# Patient Record
Sex: Male | Born: 1950 | Race: Black or African American | Hispanic: No | Marital: Single | Smoking: Former smoker
Health system: Southern US, Community
[De-identification: ages and names within clinical notes are randomized; demographics above are authoritative.]

## PROBLEM LIST (undated history)

## (undated) DIAGNOSIS — E119 Type 2 diabetes mellitus without complications: Secondary | ICD-10-CM

## (undated) DIAGNOSIS — F0390 Unspecified dementia without behavioral disturbance: Secondary | ICD-10-CM

## (undated) DIAGNOSIS — I169 Hypertensive crisis, unspecified: Secondary | ICD-10-CM

## (undated) DIAGNOSIS — E785 Hyperlipidemia, unspecified: Secondary | ICD-10-CM

## (undated) DIAGNOSIS — I10 Essential (primary) hypertension: Secondary | ICD-10-CM

## (undated) DIAGNOSIS — L97509 Non-pressure chronic ulcer of other part of unspecified foot with unspecified severity: Secondary | ICD-10-CM

## (undated) DIAGNOSIS — I639 Cerebral infarction, unspecified: Secondary | ICD-10-CM

## (undated) DIAGNOSIS — R1310 Dysphagia, unspecified: Secondary | ICD-10-CM

## (undated) DIAGNOSIS — D649 Anemia, unspecified: Secondary | ICD-10-CM

## (undated) HISTORY — PX: TONSILLECTOMY: SUR1361

## (undated) HISTORY — PX: BACK SURGERY: SHX140

## (undated) HISTORY — DX: Anemia, unspecified: D64.9

## (undated) HISTORY — DX: Unspecified dementia, unspecified severity, without behavioral disturbance, psychotic disturbance, mood disturbance, and anxiety: F03.90

## (undated) HISTORY — DX: Hyperlipidemia, unspecified: E78.5

## (undated) HISTORY — DX: Dysphagia, unspecified: R13.10

---

## 2002-10-10 ENCOUNTER — Emergency Department (HOSPITAL_COMMUNITY): Admission: EM | Admit: 2002-10-10 | Discharge: 2002-10-10 | Payer: Self-pay

## 2002-10-12 ENCOUNTER — Emergency Department (HOSPITAL_COMMUNITY): Admission: EM | Admit: 2002-10-12 | Discharge: 2002-10-12 | Payer: Self-pay

## 2013-02-15 ENCOUNTER — Inpatient Hospital Stay (HOSPITAL_COMMUNITY)
Admission: EM | Admit: 2013-02-15 | Discharge: 2013-02-16 | DRG: 078 | Disposition: A | Discharge status: Home / Self Care | Payer: Medicaid Other | Attending: Family Medicine | Admitting: Family Medicine

## 2013-02-15 ENCOUNTER — Emergency Department (HOSPITAL_COMMUNITY): Payer: Medicaid Other

## 2013-02-15 ENCOUNTER — Encounter (HOSPITAL_COMMUNITY): Payer: Self-pay | Admitting: Emergency Medicine

## 2013-02-15 DIAGNOSIS — I674 Hypertensive encephalopathy: Secondary | ICD-10-CM

## 2013-02-15 DIAGNOSIS — Z6825 Body mass index (BMI) 25.0-25.9, adult: Secondary | ICD-10-CM

## 2013-02-15 DIAGNOSIS — E46 Unspecified protein-calorie malnutrition: Secondary | ICD-10-CM | POA: Diagnosis present

## 2013-02-15 DIAGNOSIS — R7309 Other abnormal glucose: Secondary | ICD-10-CM

## 2013-02-15 DIAGNOSIS — D649 Anemia, unspecified: Secondary | ICD-10-CM | POA: Diagnosis present

## 2013-02-15 DIAGNOSIS — I169 Hypertensive crisis, unspecified: Secondary | ICD-10-CM | POA: Diagnosis present

## 2013-02-15 DIAGNOSIS — Z794 Long term (current) use of insulin: Secondary | ICD-10-CM

## 2013-02-15 DIAGNOSIS — I10 Essential (primary) hypertension: Secondary | ICD-10-CM

## 2013-02-15 DIAGNOSIS — F17200 Nicotine dependence, unspecified, uncomplicated: Secondary | ICD-10-CM | POA: Diagnosis present

## 2013-02-15 DIAGNOSIS — I160 Hypertensive urgency: Secondary | ICD-10-CM

## 2013-02-15 DIAGNOSIS — I959 Hypotension, unspecified: Secondary | ICD-10-CM | POA: Diagnosis present

## 2013-02-15 DIAGNOSIS — L84 Corns and callosities: Secondary | ICD-10-CM

## 2013-02-15 DIAGNOSIS — E119 Type 2 diabetes mellitus without complications: Secondary | ICD-10-CM | POA: Diagnosis present

## 2013-02-15 DIAGNOSIS — R739 Hyperglycemia, unspecified: Secondary | ICD-10-CM

## 2013-02-15 HISTORY — DX: Essential (primary) hypertension: I10

## 2013-02-15 HISTORY — DX: Type 2 diabetes mellitus without complications: E11.9

## 2013-02-15 HISTORY — DX: Hypertensive crisis, unspecified: I16.9

## 2013-02-15 LAB — GLUCOSE, CAPILLARY
GLUCOSE-CAPILLARY: 221 mg/dL — AB (ref 70–99)
Glucose-Capillary: >600 mg/dL (ref 70–99)
Glucose-Capillary: 486 mg/dL — ABNORMAL HIGH (ref 70–99)
Glucose-Capillary: 401 mg/dL — ABNORMAL HIGH (ref 70–99)
Glucose-Capillary: 429 mg/dL — ABNORMAL HIGH (ref 70–99)
Glucose-Capillary: 334 mg/dL — ABNORMAL HIGH (ref 70–99)
Glucose-Capillary: 288 mg/dL — ABNORMAL HIGH (ref 70–99)

## 2013-02-15 LAB — LIPID PANEL
Cholesterol: 189 mg/dL (ref 0–200)
HDL: 49 mg/dL (ref >39)
LDL Cholesterol: 114 mg/dL — ABNORMAL HIGH (ref 0–99)
Total CHOL/HDL Ratio: 3.9 ratio
Triglycerides: 129 mg/dL (ref <150)
VLDL: 26 mg/dL (ref 0–40)

## 2013-02-15 LAB — RETICULOCYTES
RBC.: 3.61 MIL/uL — ABNORMAL LOW (ref 4.22–5.81)
Retic Count, Absolute: 65 10*3/uL (ref 19.0–186.0)
Retic Ct Pct: 1.8 % (ref 0.4–3.1)

## 2013-02-15 LAB — URINALYSIS, ROUTINE W REFLEX MICROSCOPIC
Bilirubin Urine: NEGATIVE
Glucose, UA: >1000 mg/dL — AB
Hgb urine dipstick: NEGATIVE
Ketones, ur: NEGATIVE mg/dL
Leukocytes, UA: NEGATIVE
Nitrite: NEGATIVE
Protein, ur: NEGATIVE mg/dL
Specific Gravity, Urine: 1.028 (ref 1.005–1.030)
Urobilinogen, UA: 0.2 mg/dL (ref 0.0–1.0)
pH: 7 (ref 5.0–8.0)

## 2013-02-15 LAB — CBC
HCT: 35.2 % — ABNORMAL LOW (ref 39.0–52.0)
Hemoglobin: 12 g/dL — ABNORMAL LOW (ref 13.0–17.0)
MCH: 31 pg (ref 26.0–34.0)
MCHC: 34.1 g/dL (ref 30.0–36.0)
MCV: 91 fL (ref 78.0–100.0)
Platelets: 206 K/uL (ref 150–400)
RBC: 3.87 MIL/uL — ABNORMAL LOW (ref 4.22–5.81)
RDW: 12.5 % (ref 11.5–15.5)
WBC: 6.5 K/uL (ref 4.0–10.5)

## 2013-02-15 LAB — BASIC METABOLIC PANEL
BUN: 21 mg/dL (ref 6–23)
CO2: 26 mEq/L (ref 19–32)
Calcium: 9.1 mg/dL (ref 8.4–10.5)
Chloride: 94 mEq/L — ABNORMAL LOW (ref 96–112)
Creatinine, Ser: 1.17 mg/dL (ref 0.50–1.35)
GFR calc Af Amer: 76 mL/min — ABNORMAL LOW (ref >90)
GFR calc non Af Amer: 66 mL/min — ABNORMAL LOW (ref >90)
Glucose, Bld: 669 mg/dL (ref 70–99)
Potassium: 4.6 mEq/L (ref 3.7–5.3)

## 2013-02-15 LAB — RAPID URINE DRUG SCREEN, HOSP PERFORMED
Amphetamines: NOT DETECTED
Barbiturates: NOT DETECTED
Benzodiazepines: NOT DETECTED
COCAINE: NOT DETECTED
Opiates: NOT DETECTED
Tetrahydrocannabinol: NOT DETECTED

## 2013-02-15 LAB — BASIC METABOLIC PANEL WITH GFR: Sodium: 133 meq/L — ABNORMAL LOW (ref 137–147)

## 2013-02-15 LAB — URINE MICROSCOPIC-ADD ON

## 2013-02-15 LAB — TROPONIN I
Troponin I: <0.3 ng/mL (ref <0.30)
Troponin I: <0.3 ng/mL (ref <0.30)

## 2013-02-15 LAB — HEMOGLOBIN A1C
Hgb A1c MFr Bld: 10.6 % — ABNORMAL HIGH (ref <5.7)
Mean Plasma Glucose: 258 mg/dL — ABNORMAL HIGH (ref <117)

## 2013-02-15 LAB — MRSA PCR SCREENING: MRSA by PCR: NEGATIVE

## 2013-02-15 MED ORDER — INSULIN GLARGINE 100 UNIT/ML ~~LOC~~ SOLN
15.0000 [IU] | Freq: Every day | SUBCUTANEOUS | Status: DC
  Administered 2013-02-15: 15 [IU] via SUBCUTANEOUS
  Filled 2013-02-15 (×2): qty 0.15

## 2013-02-15 MED ORDER — SODIUM CHLORIDE 0.9 % IV BOLUS (SEPSIS)
1000.0000 mL | Freq: Once | INTRAVENOUS | Status: AC
  Administered 2013-02-15: 1000 mL via INTRAVENOUS

## 2013-02-15 MED ORDER — LABETALOL HCL 5 MG/ML IV SOLN
10.0000 mg | Freq: Once | INTRAVENOUS | Status: AC
  Administered 2013-02-15: 10 mg via INTRAVENOUS
  Filled 2013-02-15: qty 4

## 2013-02-15 MED ORDER — AMLODIPINE BESYLATE 10 MG PO TABS
10.0000 mg | ORAL_TABLET | Freq: Every day | ORAL | Status: DC
  Administered 2013-02-15: 10 mg via ORAL
  Filled 2013-02-15 (×2): qty 1

## 2013-02-15 MED ORDER — VITAMIN B-1 100 MG PO TABS
100.0000 mg | ORAL_TABLET | Freq: Every day | ORAL | Status: DC
  Administered 2013-02-16: 100 mg via ORAL
  Filled 2013-02-15 (×2): qty 1

## 2013-02-15 MED ORDER — ADULT MULTIVITAMIN W/MINERALS CH
1.0000 | ORAL_TABLET | Freq: Every day | ORAL | Status: DC
  Administered 2013-02-15 – 2013-02-16 (×2): 1 via ORAL
  Filled 2013-02-15 (×2): qty 1

## 2013-02-15 MED ORDER — LISINOPRIL 5 MG PO TABS
5.0000 mg | ORAL_TABLET | Freq: Every day | ORAL | Status: DC
  Administered 2013-02-15 – 2013-02-16 (×2): 5 mg via ORAL
  Filled 2013-02-15 (×2): qty 1

## 2013-02-15 MED ORDER — HEPARIN SODIUM (PORCINE) 5000 UNIT/ML IJ SOLN
5000.0000 [IU] | Freq: Three times a day (TID) | INTRAMUSCULAR | Status: DC
  Administered 2013-02-15 – 2013-02-16 (×2): 5000 [IU] via SUBCUTANEOUS
  Filled 2013-02-15 (×5): qty 1

## 2013-02-15 MED ORDER — FOLIC ACID 1 MG PO TABS
1.0000 mg | ORAL_TABLET | Freq: Every day | ORAL | Status: DC
  Administered 2013-02-15 – 2013-02-16 (×2): 1 mg via ORAL
  Filled 2013-02-15 (×2): qty 1

## 2013-02-15 MED ORDER — LORAZEPAM 0.5 MG PO TABS
1.0000 mg | ORAL_TABLET | Freq: Four times a day (QID) | ORAL | Status: DC | PRN
Start: 2013-02-15 — End: 2013-02-16

## 2013-02-15 MED ORDER — INSULIN ASPART 100 UNIT/ML ~~LOC~~ SOLN: 0.0000 [IU] | Freq: Every day | SUBCUTANEOUS | Status: DC

## 2013-02-15 MED ORDER — LORAZEPAM 2 MG/ML IJ SOLN: 1.0000 mg | Freq: Four times a day (QID) | INTRAMUSCULAR | Status: DC | PRN

## 2013-02-15 MED ORDER — INSULIN ASPART 100 UNIT/ML ~~LOC~~ SOLN
0.0000 [IU] | Freq: Three times a day (TID) | SUBCUTANEOUS | Status: DC
Start: 2013-02-15 — End: 2013-02-16
  Administered 2013-02-15 – 2013-02-16 (×2): 5 [IU] via SUBCUTANEOUS

## 2013-02-15 MED ORDER — THIAMINE HCL 100 MG/ML IJ SOLN
100.0000 mg | Freq: Every day | INTRAMUSCULAR | Status: DC
  Administered 2013-02-15: 100 mg via INTRAVENOUS
  Filled 2013-02-15: qty 2
  Filled 2013-02-15: qty 1

## 2013-02-15 MED ORDER — SODIUM CHLORIDE 0.9 % IJ SOLN
3.0000 mL | Freq: Two times a day (BID) | INTRAMUSCULAR | Status: DC
  Administered 2013-02-16: 3 mL via INTRAVENOUS

## 2013-02-15 MED ORDER — PNEUMOCOCCAL VAC POLYVALENT 25 MCG/0.5ML IJ INJ
0.5000 mL | INJECTION | INTRAMUSCULAR | Status: AC
  Administered 2013-02-16: 0.5 mL via INTRAMUSCULAR
  Filled 2013-02-15: qty 0.5

## 2013-02-15 MED ORDER — INSULIN ASPART 100 UNIT/ML ~~LOC~~ SOLN
10.0000 [IU] | Freq: Once | SUBCUTANEOUS | Status: AC
  Administered 2013-02-15: 10 [IU] via SUBCUTANEOUS
  Filled 2013-02-15: qty 1

## 2013-02-15 MED ORDER — THIAMINE HCL 100 MG/ML IJ SOLN
INTRAVENOUS | Status: DC
  Administered 2013-02-15: 16:00:00 via INTRAVENOUS
  Filled 2013-02-15 (×4): qty 1000

## 2013-02-15 MED ORDER — LABETALOL HCL 5 MG/ML IV SOLN
2.0000 mg/min | INTRAVENOUS | Status: DC
  Filled 2013-02-15: qty 100

## 2013-02-15 NOTE — ED Notes (Signed)
Pt states he has felt "woozy" since yesterday and hes worried his blood sugar may be high. He has no glucometer at home. He denies pain and is A&Ox4. He came alone. He denies LOC. HE appears to have a R sided facial droop. He has no arm drift and no trouble with his speech

## 2013-02-15 NOTE — H&P (Signed)
FMTS Attending Admission Note: Manuel Neal MD [phone] pager office [phone] I  have seen and examined this patient, reviewed their chart. I have discussed this patient with the resident. I agree with the resident's findings, assessment and care plan. 

## 2013-02-15 NOTE — ED Notes (Signed)
Returned from ct scan and placed back on monitor 

## 2013-02-15 NOTE — ED Provider Notes (Addendum)
Medical screening examination/treatment/procedure(s) were conducted as a shared visit with non-physician practitioner(s) and myself.  I personally evaluated the patient during the encounter.  EKG Interpretation    Date/Time:  Saturday February 15 2013 09:58:12 EST Ventricular Rate:  99 PR Interval:  175 QRS Duration: 92 QT Interval:  352 QTC Calculation: 452 R Axis:   77 Text Interpretation:  Sinus rhythm Biatrial enlargement Left ventricular hypertrophy Repolarization abnormality secondary to ventricular hypertrophy Baseline wander in lead(s) V3 Abnormal ekg No significant change since last tracing Confirmed by [facility]  MD, MICHEAL ([id number]) on 02/15/2013 11:16:26 AM            Pt with slurred speech, feels dizzy, has not been taking his blood sugars at home due to no equipment, but reports taking his BP and insulin medications.  Pt's glucose >600 initially, no vomiting.  Initial BP is >190, 200 systolic.  No HA, CP, SOB, but midlly encephalopathic, may need IV antihypertensives for HTN emergency and admission.  Doubt DKA.    Pt's glucose has been improved with IVF's and Becker insulin.  However BP has remained quite elevated.  Will start gtt labetalol and admit for HTN emergency.   CRITICAL CARE Performed by: Lear NgGHIM,Lotta Frankenfield Y. Total critical care time: 30 min Critical care time was exclusive of separately billable procedures and treating other patients. Critical care was necessary to treat or prevent imminent or life-threatening deterioration. Critical care was time spent personally by me on the following activities: development of treatment plan with patient and/or surrogate as well as nursing, discussions with consultants, evaluation of patient's response to treatment, examination of patient, obtaining history from patient or surrogate, ordering and performing treatments and interventions, ordering and review of laboratory studies, ordering and review of radiographic studies, pulse oximetry and  re-evaluation of patient's condition.   Gavin PoundMichael Y. Oletta LamasGhim, MD 02/15/13 1425  Gavin PoundMichael Y. Sreekar Broyhill, MD 02/15/13 1622

## 2013-02-15 NOTE — H&P (Signed)
Family Medicine Teaching [facility] Ad[facility] History and Physical Service Pager: [phone]  Patient name: Manuel Higgins Medical record number: [id number] Date of birth: 09/01/1950 Age: 63 y.o. Gender: male  Primary Care Provider: Bernerd LimboJohn [name] ([facility] in [place]) Consultants: none Code Status: Full  Chief Complaint: Confusion, malaise,   Assessment and Plan: Manuel PanderDarrell Dowen is a 63 y.o. year old male presenting with 24 hours of malaise, fatigue, elevated CBG and HTN . PMH is significant for lack of medical care, hypertension, diabetes.  # HTN Urgency/Emergency: ?altered MS vs borderline baseline functioning.  Will treat as hypertensive encephalopathy - Decreased blood pressure of 25% of in first 24 hours. - Avoid nitroglycerin as increased ICP associated with this - Not requiring IV labetalol drip at this time but low threshold to consider transition to drip if unable to control or waxing/waning mental status or focal neurologic deficit - CT of head negative for acute bleed - Start by mouth amlodipine and lisinopril. - Cycle cardiac enzymes  # Encephalopathy: - Unclear etiology, treating for hypertensive emergency however consider other causes including alcohol withdrawal as previously alcohol use was noted on his chart but he denies at this time. -  Banana bag x1 now, IV thiamine 100 mg x1 now - CIWA  protocol, low threshold for Ativan  # Hyperglycemia: Non-acidotic, responding to subcutaneous insulin - Reportedly previously on insulin but has not filled any prescriptions since February 2014 but could be obtaining over-the-counter. - Start low-dose basal plus moderate sliding scale  # Anemia, normocytic. - Could possibly be a mixed picture of iron deficiency and B12, check iron panel and B12 level. - Continue to monitor  # Risks stratification: - TSH, A1c, lipid profile - No known prior coronary artery disease. - Has not had a colonoscopy  FEN/GI: Modify diet,  no PPI Prophylaxis: Heparin subcutaneous  Disposition: Inpatient admission to step down unit for blood pressure control, glucose control.  Attending Dr. Jennette KettleNeal.  Social work consult for concerns for homelessness and [facility]  History of Present Illness: Manuel PanderDarrell Tall is a 63 y.o. year old male presenting with 24 hours of malaise, fatigue, noted to have elevated blood pressures at the group home he lives in.  Due to his worsening confusion, lightheadedness and overall fatigue presented for further evaluation.  He reports being on insulin and her blood pressure medications but is unsure as to what they are.  [place] [facility] [place] [facility] [place] [facility].  Previously homeless.  In emergency department found to be in progressively hypotensive with profound hyperglycemia with a waxing and waning mental status.  FMTS call permission of management of both.  Review Of Systems: Per HPI with the following additions: Patient denies chest pain, palpitations, orthopnea, PND.  Reports significant polydipsia and polyuria over the past 3 weeks.  Denies hematochezia, on stools.   Reports nausea no vomiting.  Reports good appetite.  Denies any weight loss.    HISTORY: Past Medical History  Diagnosis Date  . Hypertension   . Diabetes mellitus without complication    History reviewed. No pertinent past surgical history. Social Hx: History   Social History  . Marital Status: Divorced    Spouse Name: N/A    Number of Children: N/A  . Years of Education: N/A   Social History Main Topics  . Smoking status: Current Every Day Smoker -- 0.33 packs/day  . Smokeless tobacco: None  . Alcohol Use: No     Comment:  Denies Currently but previously marked as yes  . Drug Use: No     Comment: denies hx of IVDrug use  . Sexual Activity: None   Other Topics Concern  . None   Social History Narrative   Lives in [place] in [place] for past 6 weeks.  Previously  homeless.   Unemployed.   History reviewed. No pertinent family history. No Known Allergies Home Medications (NOT Verifiable - see above): Medication Sig  insulin aspart protamine- aspart (NOVOLOG MIX 70/30) (70-30) 100 UNIT/ML injection Inject 20 Units into the skin 2 (two) times daily.  PRESCRIPTION MEDICATION Take 1 tablet by mouth daily. Two different blood pressure medications. Last dose yesterday.    OBJECTIVE: Temp:  [97.9 F (36.6 C)-98.8 F (37.1 C)] 98.8 F (37.1 C) (01/31 1606) Pulse Rate:  [90-107] 96 (01/31 1515) Resp:  [15-22] 16 (01/31 1606) BP: (171-233)/(72-119) 183/82 mmHg (01/31 1606) SpO2:  [97 %-100 %] 99 % (01/31 1606) Weight:  [172 lb (78.019 kg)] 172 lb (78.019 kg) (01/31 0925) Body mass index is 25.39 kg/(m^2). BASELINE WEIGHT:  Unknown   Filed Weights   02/15/13 0925  Weight: 172 lb (78.019 kg)      Physical Exam:  GENERAL: Adult African American  male. In no discomfort; no respiratory distress  PSYCH: alert and appropriate, good insight   HNEENT: Mildly dry mucous membranes, no JVD/HJR  CARDIO: RRR, S1/S2 heard, no murmur  LUNGS: CTA B, no wheezes, no crackles  ABDOMEN: +BS, soft, non-tender, no rigidity, no guarding, no masses/hepatosplenomegaly  EXTREM:  Warm, well perfused.  Moves all 4 extremities spontaneously; no lateralization.  No noted foot lesions.  Distal pulses 3/4.  no pretibial edema.  GU:   SKIN:    EKG:  1/31 - NSR 99 - LVH criteria, no ischemia.   LABS:  Basic Labs Extended:   Recent Labs Lab 02/15/13 1010  WBC 6.5  HGB 12.0*  HCT 35.2*  PLT 206    Recent Labs Lab 02/15/13 1010  NA 133*  K 4.6  CL 94*  CO2 26  BUN 21  CREATININE 1.17  GLUCOSE 669*  CALCIUM 9.1   No results found for this basename: ALBUMIN, PROTEIN, ALT, AST, ALKPHOS, BILITOT, MG, LIPASE,  in the last 168 hours     Urinalysis: Further Urine Studies:  >1000 glucose, otherwise unremarkable  Recent Labs  02/15/13 1002  LABOPIA NONE  DETECTED  COCAINSCRNUR NONE DETECTED  LABBENZ NONE DETECTED  AMPHETMU NONE DETECTED  THCU NONE DETECTED  LABBARB NONE DETECTED     IMAGING: 1/31 - Head CT - neg for acute bleed   Andrena Mews, DO [facility] Resident - PGY- 3  02/15/2013 4:16 PM

## 2013-02-15 NOTE — ED Notes (Signed)
Attempted to call report to [facility], rn will call me back

## 2013-02-15 NOTE — ED Provider Notes (Signed)
CSN: [id number]     Arrival date & time 02/15/13  0857 History   First MD Initiated Contact with Patient 02/15/13 [phone]     Chief Complaint  Patient presents with  . Dizziness   (Consider location/radiation/quality/duration/timing/severity/associated sxs/prior Treatment) HPI Comments: Patient with h/o DM diagnosed 10-15 years ago, on Metformin and insulin, patient states he last took yesterday but not today -- presents stating that he has felt 'woozy' for two days and came in because he thought his blood sugar was high. He has not checked at home. No fever, URI sx, CP, SOB, cough, N/V/D, abd pain, urinary sx. No difficulty walking. No lightheadedness or syncope. Patient denies signs of stroke including:slurred speech, aphasia, weakness/numbness in extremities, imbalance/trouble walking. The onset of this condition was acute. The course is constant. Aggravating factors: none. Alleviating factors: none.      The history is provided by the patient.    Past Medical History  Diagnosis Date  . Hypertension   . Diabetes mellitus without complication    History reviewed. No pertinent past surgical history. History reviewed. No pertinent family history. History  Substance Use Topics  . Smoking status: Never Smoker   . Smokeless tobacco: Not on file  . Alcohol Use: Yes    Review of Systems  Constitutional: Negative for fever.  HENT: Negative for rhinorrhea and sore throat.   Eyes: Negative for redness.  Respiratory: Negative for cough and shortness of breath.   Cardiovascular: Negative for chest pain.  Gastrointestinal: Negative for nausea, vomiting, abdominal pain and diarrhea.  Endocrine: Positive for polydipsia and polyuria.  Genitourinary: Negative for dysuria.  Musculoskeletal: Negative for myalgias.  Skin: Negative for rash.  Neurological: Positive for dizziness. Negative for headaches.    Allergies  Review of patient's allergies indicates no known allergies.  Home  Medications   Current Outpatient Rx  Name  Route  Sig  Dispense  Refill  . insulin aspart protamine- aspart (NOVOLOG MIX 70/30) (70-30) 100 UNIT/ML injection   Subcutaneous   Inject 20 Units into the skin 2 (two) times daily.         Marland Kitchen. PRESCRIPTION MEDICATION   Oral   Take 1 tablet by mouth daily. Two different blood pressure medications. Last dose yesterday.          BP 193/94  Pulse 100  Temp(Src) 97.9 F (36.6 C) (Oral)  Resp 22  Ht 5\' 9"  (1.753 m)  Wt 172 lb (78.019 kg)  BMI 25.39 kg/m2  Physical Exam  Nursing note and vitals reviewed. Constitutional: He is oriented to person, place, and time. He appears well-developed and well-nourished.  HENT:  Head: Normocephalic and atraumatic.  Right Ear: Tympanic membrane, external ear and ear canal normal.  Left Ear: Tympanic membrane, external ear and ear canal normal.  Nose: Nose normal.  Mouth/Throat: Uvula is midline, oropharynx is clear and moist and mucous membranes are normal.  Eyes: Conjunctivae, EOM and lids are normal. Pupils are equal, round, and reactive to light. Right eye exhibits no discharge. Left eye exhibits no discharge.  Neck: Normal range of motion. Neck supple.  Cardiovascular: Normal rate, regular rhythm and normal heart sounds.   Pulmonary/Chest: Effort normal and breath sounds normal.  Abdominal: Soft. There is no tenderness.  Musculoskeletal: Normal range of motion.       Cervical back: He exhibits normal range of motion, no tenderness and no bony tenderness.  Neurological: He is alert and oriented to person, place, and time. He has normal strength and normal  reflexes. No cranial nerve deficit or sensory deficit. He exhibits normal muscle tone. He displays a negative Romberg sign. Coordination and gait normal. GCS eye subscore is 4. GCS verbal subscore is 5. GCS motor subscore is 6.  Mild R-sided facial weakness, mild slurred speech. Neg Romberg. No weakness in extremities.   Skin: Skin is warm and  dry.  Psychiatric: He has a normal mood and affect.    ED Course  Procedures (including critical care time) Labs Review Labs Reviewed  GLUCOSE, CAPILLARY - Abnormal; Notable for the following:    Glucose-Capillary >600 (*)    All other components within normal limits  CBC - Abnormal; Notable for the following:    RBC 3.87 (*)    Hemoglobin 12.0 (*)    HCT 35.2 (*)    All other components within normal limits  BASIC METABOLIC PANEL - Abnormal; Notable for the following:    Sodium 133 (*)    Chloride 94 (*)    Glucose, Bld 669 (*)    GFR calc non Af Amer 66 (*)    GFR calc Af Amer 76 (*)    All other components within normal limits  URINALYSIS, ROUTINE W REFLEX MICROSCOPIC - Abnormal; Notable for the following:    Glucose, UA >1000 (*)    All other components within normal limits  GLUCOSE, CAPILLARY - Abnormal; Notable for the following:    Glucose-Capillary 486 (*)    All other components within normal limits  GLUCOSE, CAPILLARY - Abnormal; Notable for the following:    Glucose-Capillary 429 (*)    All other components within normal limits  GLUCOSE, CAPILLARY - Abnormal; Notable for the following:    Glucose-Capillary 401 (*)    All other components within normal limits  URINE MICROSCOPIC-ADD ON  URINE RAPID DRUG SCREEN (HOSP PERFORMED)   Imaging Review Ct Head Wo Contrast  02/15/2013   CLINICAL DATA:  Lightheadedness and weakness.  EXAM: CT HEAD WITHOUT CONTRAST  TECHNIQUE: Contiguous axial images were obtained from the base of the skull through the vertex without intravenous contrast.  COMPARISON:  None.  FINDINGS: There is no evidence of intracranial hemorrhage, brain edema, or other signs of acute infarction. There is no evidence of intracranial mass lesion or mass effect. No abnormal extraaxial fluid collections are identified.  Mild cerebral atrophy and moderate chronic small vessel disease is noted. No evidence of hydrocephalus. No skull abnormality identified.   IMPRESSION: No acute intracranial findings.  Cerebral atrophy and chronic small vessel disease.   Electronically Signed   By: Myles Rosenthal M.D.   On: 02/15/2013 12:00    EKG Interpretation    Date/Time:  Saturday February 15 2013 09:58:12 EST Ventricular Rate:  99 PR Interval:  175 QRS Duration: 92 QT Interval:  352 QTC Calculation: 452 R Axis:   77 Text Interpretation:  Sinus rhythm Biatrial enlargement Left ventricular hypertrophy Repolarization abnormality secondary to ventricular hypertrophy Baseline wander in lead(s) V3 Abnormal ekg No significant change since last tracing Confirmed by [facility]  MD, MICHEAL ([id number]) on 02/15/2013 11:16:26 AM           9:39 AM Patient seen and examined. Work-up initiated. Medications ordered.   Vital signs reviewed and are as follows: Filed Vitals:   02/15/13 0925  BP: 193/94  Pulse: 100  Temp: 97.9 F (36.6 C)  Resp: 22   11:02 AM Reviewed findings with Dr. Oletta Lamas who will see.   Labetalol IV x 1 given with improvement in BP. However this was  short-lived and BP elevated back into 200's. Labetalol drip ordered.   1:41 PM Spoke with [facility] who will take this unassigned patient.   MDM   1. Hypertensive urgency   2. Hyperglycemia without ketosis    Admit.     Renne Crigler, PA-C 02/15/13 1342

## 2013-02-15 NOTE — ED Notes (Signed)
Patient transported to CT 

## 2013-02-16 ENCOUNTER — Encounter (HOSPITAL_COMMUNITY): Payer: Self-pay | Admitting: Sports Medicine

## 2013-02-16 DIAGNOSIS — E119 Type 2 diabetes mellitus without complications: Secondary | ICD-10-CM | POA: Diagnosis present

## 2013-02-16 DIAGNOSIS — I674 Hypertensive encephalopathy: Secondary | ICD-10-CM | POA: Diagnosis present

## 2013-02-16 DIAGNOSIS — L84 Corns and callosities: Secondary | ICD-10-CM

## 2013-02-16 LAB — COMPREHENSIVE METABOLIC PANEL
ALT: 12 U/L (ref 0–53)
AST: 14 U/L (ref 0–37)
Albumin: 3.1 g/dL — ABNORMAL LOW (ref 3.5–5.2)
Alkaline Phosphatase: 140 U/L — ABNORMAL HIGH (ref 39–117)
CO2: 24 mEq/L (ref 19–32)
Calcium: 8.5 mg/dL (ref 8.4–10.5)
GFR calc Af Amer: >90 mL/min (ref >90)
GFR calc non Af Amer: 79 mL/min — ABNORMAL LOW (ref >90)
Potassium: 3.6 mEq/L — ABNORMAL LOW (ref 3.7–5.3)
Sodium: 141 mEq/L (ref 137–147)
Total Protein: 6.5 g/dL (ref 6.0–8.3)

## 2013-02-16 LAB — COMPREHENSIVE METABOLIC PANEL WITH GFR
BUN: 15 mg/dL (ref 6–23)
Chloride: 104 meq/L (ref 96–112)
Creatinine, Ser: 1 mg/dL (ref 0.50–1.35)
Glucose, Bld: 219 mg/dL — ABNORMAL HIGH (ref 70–99)
Total Bilirubin: 0.2 mg/dL — ABNORMAL LOW (ref 0.3–1.2)

## 2013-02-16 LAB — FERRITIN: Ferritin: 337 ng/mL — ABNORMAL HIGH (ref 22–322)

## 2013-02-16 LAB — VITAMIN B12: Vitamin B-12: 566 pg/mL (ref 211–911)

## 2013-02-16 LAB — TSH: TSH: 1.342 u[IU]/mL (ref 0.350–4.500)

## 2013-02-16 LAB — CBC
HCT: 31.8 % — ABNORMAL LOW (ref 39.0–52.0)
Hemoglobin: 10.8 g/dL — ABNORMAL LOW (ref 13.0–17.0)
MCH: 30.8 pg (ref 26.0–34.0)
MCHC: 34 g/dL (ref 30.0–36.0)
MCV: 90.6 fL (ref 78.0–100.0)
Platelets: 201 10*3/uL (ref 150–400)
RBC: 3.51 MIL/uL — ABNORMAL LOW (ref 4.22–5.81)
RDW: 12.3 % (ref 11.5–15.5)
WBC: 7.1 K/uL (ref 4.0–10.5)

## 2013-02-16 LAB — TROPONIN I: Troponin I: <0.3 ng/mL (ref <0.30)

## 2013-02-16 LAB — GLUCOSE, CAPILLARY
GLUCOSE-CAPILLARY: 221 mg/dL — AB (ref 70–99)
Glucose-Capillary: 187 mg/dL — ABNORMAL HIGH (ref 70–99)

## 2013-02-16 LAB — IRON AND TIBC
Iron: 67 ug/dL (ref 42–135)
Saturation Ratios: 30 % (ref 20–55)
TIBC: 227 ug/dL (ref 215–435)
UIBC: 160 ug/dL (ref 125–400)

## 2013-02-16 LAB — FOLATE: Folate: >20 ng/mL

## 2013-02-16 MED ORDER — NAPHAZOLINE HCL 0.1 % OP SOLN
1.0000 [drp] | Freq: Four times a day (QID) | OPHTHALMIC | Status: DC | PRN
  Administered 2013-02-16: 1 [drp] via OPHTHALMIC
  Filled 2013-02-16: qty 15

## 2013-02-16 MED ORDER — INSULIN ASPART PROT & ASPART (70-30 MIX) 100 UNIT/ML ~~LOC~~ SUSP
20.0000 [IU] | Freq: Two times a day (BID) | SUBCUTANEOUS | Status: DC
  Administered 2013-02-16: 20 [IU] via SUBCUTANEOUS
  Filled 2013-02-16: qty 10

## 2013-02-16 MED ORDER — AMLODIPINE BESYLATE 5 MG PO TABS
5.0000 mg | ORAL_TABLET | Freq: Every day | ORAL | Status: DC
  Administered 2013-02-16: 5 mg via ORAL
  Filled 2013-02-16: qty 1

## 2013-02-16 MED ORDER — AMLODIPINE BESYLATE 10 MG PO TABS: 10.0000 mg | ORAL_TABLET | Freq: Every day | ORAL | Status: DC

## 2013-02-16 MED ORDER — NAPHAZOLINE HCL 0.1 % OP SOLN: 1.0000 [drp] | Freq: Four times a day (QID) | OPHTHALMIC | Status: DC | PRN

## 2013-02-16 MED ORDER — LISINOPRIL 10 MG PO TABS: 10.0000 mg | ORAL_TABLET | Freq: Every day | ORAL | Status: DC

## 2013-02-16 NOTE — Discharge Summary (Signed)
Family Medicine Teaching [facility] Discharge Summary  Patient name: Manuel Higgins Medical record number: [id number] Date of birth: 28-Apr-1950 Age: 63 y.o. Gender: male Date of Admission: 02/15/2013  Date of Discharge: 02/16/13 Admitting Physician: Nestor Ramp, MD  Primary Care Provider: Bernerd Limbo Consultants: None  Indication for Hospitalization: Hypertensive urgency and hyperglycemia  Discharge Diagnoses/Problem List:  Principal Problem:   Encephalopathy, hypertensive Active Problems:   Hypertensive crisis   Diabetes   Callus of foot  Disposition: To Home  Discharge Condition: Improved  Brief Hospital Course:  Manuel Higgins is a 63 y.o. year old male presenting with 24 hours of malaise, fatigue, elevated CBG and HTN . PMH is significant for lack of medical care, hypertension, diabetes. He was noted to have elevated blood pressures at the group home he lives in.  Due to his worsening confusion, lightheadedness and overall fatigue presented for further evaluation.  He reports being on insulin and two blood pressure medications but is unsure as to what they are.     He reports previously running out of his medicines but that they have recently been refilled.He has recently moved into a transitional home in [facility].  Previously homeless.  In emergency department found to be in progressively hypertensive with profound hyperglycemia with a waxing and waning mental status.    # HTN Urgency/Emergency: ?altered MS vs borderline baseline functioning.  Treated as as hypertensive encephalopathy - Plan was to decrease blood pressure by 25% the first 24 hours.  He responded remarkably well to amlodipine 10 mg and lisinopril 10 mg.  He received 2 doses of IV labetalol.  - Unclear if  medication reconciliation performed late afternoon on the day of admission is accurate reflects the medications initiated during hospitalization he will be discharged on the below.  He reports me he feels his  medications at [facility] but after discussing with them he has not filled any medications since February 2014 with them.  - BP 233/119 at highest.  Prior to d/c on dual therapy as above: 137/69 - CT of head negative for acute bleed - Start by mouth amlodipine and lisinopril. - EKG with Early Repol on day of discharge - Pt chest pain free throughout hospitalization  # Encephalopathy: - Unclear etiology, treating for hypertensive emergency however consider other causes including alcohol withdrawal as previously alcohol use was noted on his chart but he denies at this time. -  Banana bag x1 now, IV thiamine 100 mg x1 now - Did not require Ativan   # Hyperglycemia & Diabetes: Non-acidotic, responding to subcutaneous insulin - Pt reports receiving medications directly from his physician. - Sugars responded remarkally well to small amounts of insulin.  Restarted on home 70/30 20Units bid - s/p fluid resuscitation with marked improvement  # Anemia, normocytic. - Could possibly be a mixed picture of iron deficiency and B12, check iron panel and B12 level. - Continue to monitor  # Protein Calorie Malnutrition: Albumin 3.1   # Risks stratification: - TSH, A1c, lipid profile - No known prior coronary artery disease. - Has not had a colonoscopy  # Foot Callus: Markedly hyperkeratotic lesion on the right plantar aspect of the big toe.   - Followup with podiatry  - Encourage daily foot soaks and checks   Issues for Follow Up:  - Medication compliance & accessibility - reports having his home blood pressure medications that are reportedly the same as below.  New prescriptions were [facility] to [facility] (on [facility] list) - Food  security - Daily Diabetic foot exams/care - Podiatry followup - Age appropriate screening  Significant Procedures:  None  Pending Results: None  Discharge Medications:   Medication List         amLODipine 10 MG tablet  Commonly known as:  NORVASC  Take 1 tablet (10 mg  total) by mouth daily.     insulin aspart protamine- aspart (70-30) 100 UNIT/ML injection  Commonly known as:  NOVOLOG MIX 70/30  Inject 20 Units into the skin 2 (two) times daily.     lisinopril 10 MG tablet  Commonly known as:  PRINIVIL,ZESTRIL  Take 1 tablet (10 mg total) by mouth daily.     naphazoline 0.1 % ophthalmic solution  Commonly known as:  NAPHCON  Place 1 drop into both eyes 4 (four) times daily as needed for irritation.       Future Appointment information:  Follow up Issues: Follow-up Information   Schedule an appointment as soon as possible for a visit with Manuel LimboMITCHELL, Higgins. (For this week)    Specialty:  Family Medicine   Contact information:   [address], [address] [facility] [place] [address] [phone]       Schedule an appointment as soon as possible for a visit with Your podiatrist. (For this week)       OBJECTIVE Findings on Day of Discharge: Temp:  [97.8 F (36.6 C)-98.8 F (37.1 C)] 98.1 F (36.7 C) (02/01 0749) Pulse Rate:  [76-96] 76 (02/01 0749) Resp:  [14-20] 15 (02/01 0830) BP: (137-212)/(57-96) 137/69 mmHg (02/01 0830) SpO2:  [97 %-100 %] 99 % (02/01 0500) Weight:  [172 lb 6.4 oz (78.2 kg)] 172 lb 6.4 oz (78.2 kg) (01/31 1823) Body mass index is 25.45 kg/(m^2). BASELINE WEIGHT:  Unknown   Filed Weights   02/15/13 0925 02/15/13 1823  Weight: 172 lb (78.019 kg) 172 lb 6.4 oz (78.2 kg)      Physical Exam:  GENERAL: Adult African American  male. In no discomfort; no respiratory distress  PSYCH: alert and appropriate, good insight, oriented x4   HNEENT: Mildly dry mucous membranes, no JVD/HJR  CARDIO: RRR, S1/S2 heard, no murmur  LUNGS: CTA B, no wheezes, no crackles  ABDOMEN: +BS, soft, non-tender, no rigidity, no guarding, no masses/hepatosplenomegaly  EXTREM:  Warm, well perfused.  Moves all 4 extremities spontaneously; no lateralization.  Significant right foot callus with hyperkeratotic  properties.  No erythema.  Dystrophic nails.  Distal pulses 2/4.  no pretibial edema.  GU:   SKIN:    EKG:  1/31 - NSR 99 - LVH criteria, no ischemia.  2/1 - NSR 85 - LVH criteria, Early Repol - Discussed EKG with Dr. Gala RomneyBensimhon. (Pt chest pain free)  LABS:  Basic Labs Extended:   Recent Labs Lab 02/15/13 1010 02/16/13 0255  WBC 6.5 7.1  HGB 12.0* 10.8*  HCT 35.2* 31.8*  PLT 206 201     Recent Labs Lab 02/15/13 1010 02/16/13 0255  NA 133* 141  K 4.6 3.6*  CL 94* 104  CO2 26 24  BUN 21 15  CREATININE 1.17 1.00  GLUCOSE 669* 219*  CALCIUM 9.1 8.5     Recent Labs Lab 02/16/13 0255  ALBUMIN 3.1*  ALT 12  AST 14  ALKPHOS 140*  BILITOT 0.2*    Recent Labs  02/15/13 1534 02/15/13 1550  HGBA1C 10.6*  --   TRIG  --  129  CHOL  --  189  HDL  --  49  LDLCALC  --  114*     Recent Labs Lab 02/15/13 1521 02/15/13 2154 02/16/13 0255  TROPONINI <0.30 <0.30 <0.30     02/15/2013 21:54  Iron 67  UIBC 160  TIBC 227  Saturation Ratios 30     Urinalysis: Further Urine Studies:  >1000 glucose, otherwise unremarkable  Recent Labs  02/15/13 1002  LABOPIA NONE DETECTED  COCAINSCRNUR NONE DETECTED  LABBENZ NONE DETECTED  AMPHETMU NONE DETECTED  THCU NONE DETECTED  LABBARB NONE DETECTED     IMAGING: 1/31 - Head CT - neg for acute bleed   Andrena Mews, DO [facility] Resident - PGY- 3  02/16/2013 11:56 AM

## 2013-02-16 NOTE — Discharge Summary (Signed)
Family Medicine Teaching Service  Discharge Note : Attending Denny LevySara Shalie Schremp MD Pager [phone] Office [phone] I have seen and examined this patient, reviewed their chart and discussed discharge planning wit the resident at the time of discharge. I agree with the discharge plan as above. I certainly think there some component of noncompliance with his blood pressure medicines as cause for his elevated pressures.

## 2013-02-17 LAB — GLUCOSE, CAPILLARY: Glucose-Capillary: 194 mg/dL — ABNORMAL HIGH (ref 70–99)

## 2013-03-16 DIAGNOSIS — I639 Cerebral infarction, unspecified: Secondary | ICD-10-CM

## 2013-03-16 HISTORY — DX: Cerebral infarction, unspecified: I63.9

## 2013-03-26 ENCOUNTER — Emergency Department (INDEPENDENT_AMBULATORY_CARE_PROVIDER_SITE_OTHER)
Admission: EM | Admit: 2013-03-26 | Discharge: 2013-03-26 | Disposition: A | Discharge status: Home / Self Care | Payer: Medicaid Other | Source: Home / Self Care | Attending: Family Medicine | Admitting: Family Medicine

## 2013-03-26 ENCOUNTER — Encounter (HOSPITAL_COMMUNITY): Payer: Self-pay | Admitting: Emergency Medicine

## 2013-03-26 DIAGNOSIS — B353 Tinea pedis: Secondary | ICD-10-CM

## 2013-03-26 MED ORDER — TERBINAFINE HCL 250 MG PO TABS: 250.0000 mg | ORAL_TABLET | Freq: Every day | ORAL | Status: DC

## 2013-03-26 NOTE — ED Provider Notes (Signed)
Manuel Higgins is a 63 y.o. male who presents to Urgent Care today for left great toe rash. Patient notes a black plaque on his left great toe. This is been present for 4 months. He denies any injury pain fevers or chills. It is mildly itchy. He feels well otherwise.   Past Medical History  Diagnosis Date  . Hypertension   . Diabetes mellitus without complication    History  Substance Use Topics  . Smoking status: Current Every Day Smoker -- 0.33 packs/day  . Smokeless tobacco: Not on file  . Alcohol Use: No     Comment: Denies Currently but previously marked as yes   ROS as above Medications: No current facility-administered medications for this encounter.   Current Outpatient Prescriptions  Medication Sig Dispense Refill  . amLODipine (NORVASC) 10 MG tablet Take 1 tablet (10 mg total) by mouth daily.  30 tablet  0  . insulin aspart protamine- aspart (NOVOLOG MIX 70/30) (70-30) 100 UNIT/ML injection Inject 20 Units into the skin 2 (two) times daily.      Marland Kitchen. lisinopril (PRINIVIL,ZESTRIL) 10 MG tablet Take 1 tablet (10 mg total) by mouth daily.  30 tablet  0  . naphazoline (NAPHCON) 0.1 % ophthalmic solution Place 1 drop into both eyes 4 (four) times daily as needed for irritation.  15 mL  0  . terbinafine (LAMISIL) 250 MG tablet Take 1 tablet (250 mg total) by mouth daily.  14 tablet  0    Exam:  BP 171/78  Pulse 88  Temp(Src) 98.7 F (37.1 C) (Oral)  Resp 16  SpO2 99%  Gen: Well NAD LEFT FOOT: Hypertrophied nails. Black velvety plaque involving the left great toe and interdigital web space consistent with tinea nigra or tinea pedis. P   Assessment and Plan: 63 y.o. male with tinea pedis. Patient has trouble getting down to his feet. Will use oral terbinafine. Followup with primary care provider ASAP.  Discussed warning signs or symptoms. Please see discharge instructions. Patient expresses understanding.    Rodolph BongEvan S Taneka Espiritu, MD 03/26/13 1250

## 2013-03-26 NOTE — Discharge Instructions (Signed)
Thank you for coming in today. Soak your feet in warm water and clean them daily. Take terbinafine daily for 2 weeks Followup with your doctor soon.    Athlete's Foot  Athlete's foot is a skin infection caused by a fungus. Athlete's foot is often seen between or under the toes. It can also be seen on the bottom of the foot. Athlete's foot can spread to other people by sharing towels or shower stalls. HOME CARE  Only take medicines as told by your doctor. Do not use steroid creams.  Wash your feet daily. Dry your feet well, especially between the toes.  Change your socks every day. Wear cotton or wool socks.  Change your socks 2 to 3 times a day in hot weather.  Wear sandals or canvas tennis shoes with good airflow.  If you have blisters, soak your feet in a solution as told by your doctor. Do this for 20 to 30 minutes, 2 times a day. Dry your feet well after you soak them.  Do not share towels.  Wear sandals when you use shared locker rooms or showers. GET HELP RIGHT AWAY IF:   You have a fever.  Your foot is puffy (swollen), sore, warm, or red.  You are not getting better after 7 days of treatment.  You still have athlete's foot after 30 days.  You have problems caused by your medicine. MAKE SURE YOU:   Understand these instructions.  Will watch your condition.  Will get help right away if you are not doing well or get worse. Document Released: 06/21/2007 Document Revised: 03/27/2011 Document Reviewed: 10/21/2010 [facility] Patient Information 2014 [employer] [place].

## 2013-03-26 NOTE — ED Notes (Signed)
4 month duration of problem w foot

## 2013-04-04 ENCOUNTER — Inpatient Hospital Stay (HOSPITAL_COMMUNITY): Payer: Medicaid Other

## 2013-04-04 ENCOUNTER — Inpatient Hospital Stay (HOSPITAL_COMMUNITY)
Admission: EM | Admit: 2013-04-04 | Discharge: 2013-04-09 | DRG: 040 | Disposition: A | Discharge status: Home Health | Payer: Medicaid Other | Attending: Internal Medicine | Admitting: Internal Medicine

## 2013-04-04 ENCOUNTER — Encounter (HOSPITAL_COMMUNITY): Payer: Self-pay | Admitting: Emergency Medicine

## 2013-04-04 ENCOUNTER — Emergency Department (HOSPITAL_COMMUNITY): Payer: Medicaid Other

## 2013-04-04 DIAGNOSIS — G9340 Encephalopathy, unspecified: Secondary | ICD-10-CM | POA: Diagnosis present

## 2013-04-04 DIAGNOSIS — E785 Hyperlipidemia, unspecified: Secondary | ICD-10-CM | POA: Diagnosis present

## 2013-04-04 DIAGNOSIS — L97509 Non-pressure chronic ulcer of other part of unspecified foot with unspecified severity: Secondary | ICD-10-CM | POA: Diagnosis present

## 2013-04-04 DIAGNOSIS — I6350 Cerebral infarction due to unspecified occlusion or stenosis of unspecified cerebral artery: Secondary | ICD-10-CM | POA: Diagnosis not present

## 2013-04-04 DIAGNOSIS — E1169 Type 2 diabetes mellitus with other specified complication: Secondary | ICD-10-CM

## 2013-04-04 DIAGNOSIS — L02619 Cutaneous abscess of unspecified foot: Secondary | ICD-10-CM | POA: Diagnosis present

## 2013-04-04 DIAGNOSIS — I6340 Cerebral infarction due to embolism of unspecified cerebral artery: Secondary | ICD-10-CM | POA: Diagnosis present

## 2013-04-04 DIAGNOSIS — I4891 Unspecified atrial fibrillation: Secondary | ICD-10-CM | POA: Diagnosis present

## 2013-04-04 DIAGNOSIS — I639 Cerebral infarction, unspecified: Secondary | ICD-10-CM | POA: Diagnosis present

## 2013-04-04 DIAGNOSIS — Z794 Long term (current) use of insulin: Secondary | ICD-10-CM | POA: Diagnosis not present

## 2013-04-04 DIAGNOSIS — E119 Type 2 diabetes mellitus without complications: Secondary | ICD-10-CM | POA: Diagnosis not present

## 2013-04-04 DIAGNOSIS — R471 Dysarthria and anarthria: Secondary | ICD-10-CM | POA: Diagnosis present

## 2013-04-04 DIAGNOSIS — L84 Corns and callosities: Secondary | ICD-10-CM

## 2013-04-04 DIAGNOSIS — F17200 Nicotine dependence, unspecified, uncomplicated: Secondary | ICD-10-CM | POA: Diagnosis present

## 2013-04-04 DIAGNOSIS — E1165 Type 2 diabetes mellitus with hyperglycemia: Secondary | ICD-10-CM | POA: Diagnosis present

## 2013-04-04 DIAGNOSIS — L03039 Cellulitis of unspecified toe: Secondary | ICD-10-CM | POA: Diagnosis present

## 2013-04-04 DIAGNOSIS — R1310 Dysphagia, unspecified: Secondary | ICD-10-CM | POA: Diagnosis present

## 2013-04-04 DIAGNOSIS — Z8673 Personal history of transient ischemic attack (TIA), and cerebral infarction without residual deficits: Secondary | ICD-10-CM

## 2013-04-04 DIAGNOSIS — I169 Hypertensive crisis, unspecified: Secondary | ICD-10-CM

## 2013-04-04 DIAGNOSIS — I10 Essential (primary) hypertension: Secondary | ICD-10-CM | POA: Diagnosis present

## 2013-04-04 DIAGNOSIS — I517 Cardiomegaly: Secondary | ICD-10-CM

## 2013-04-04 DIAGNOSIS — N179 Acute kidney failure, unspecified: Secondary | ICD-10-CM | POA: Diagnosis present

## 2013-04-04 DIAGNOSIS — G459 Transient cerebral ischemic attack, unspecified: Secondary | ICD-10-CM | POA: Diagnosis present

## 2013-04-04 DIAGNOSIS — IMO0002 Reserved for concepts with insufficient information to code with codable children: Secondary | ICD-10-CM | POA: Diagnosis present

## 2013-04-04 DIAGNOSIS — I059 Rheumatic mitral valve disease, unspecified: Secondary | ICD-10-CM | POA: Diagnosis not present

## 2013-04-04 DIAGNOSIS — I674 Hypertensive encephalopathy: Secondary | ICD-10-CM

## 2013-04-04 HISTORY — DX: Cerebral infarction, unspecified: I63.9

## 2013-04-04 LAB — GLUCOSE, CAPILLARY
GLUCOSE-CAPILLARY: 228 mg/dL — AB (ref 70–99)
GLUCOSE-CAPILLARY: 242 mg/dL — AB (ref 70–99)
Glucose-Capillary: 202 mg/dL — ABNORMAL HIGH (ref 70–99)
Glucose-Capillary: 276 mg/dL — ABNORMAL HIGH (ref 70–99)

## 2013-04-04 LAB — CBC
HEMATOCRIT: 37.5 % — AB (ref 39.0–52.0)
Hemoglobin: 13.3 g/dL (ref 13.0–17.0)
MCH: 31.7 pg (ref 26.0–34.0)
MCHC: 35.5 g/dL (ref 30.0–36.0)
MCV: 89.3 fL (ref 78.0–100.0)
Platelets: 192 10*3/uL (ref 150–400)
RBC: 4.2 MIL/uL — AB (ref 4.22–5.81)
RDW: 11.8 % (ref 11.5–15.5)
WBC: 7.9 10*3/uL (ref 4.0–10.5)

## 2013-04-04 LAB — I-STAT TROPONIN, ED: Troponin i, poc: 0.01 ng/mL (ref 0.00–0.08)

## 2013-04-04 LAB — RAPID URINE DRUG SCREEN, HOSP PERFORMED
Amphetamines: NOT DETECTED
BENZODIAZEPINES: NOT DETECTED
Barbiturates: NOT DETECTED
Cocaine: NOT DETECTED
OPIATES: NOT DETECTED
Tetrahydrocannabinol: NOT DETECTED

## 2013-04-04 LAB — COMPREHENSIVE METABOLIC PANEL
ALT: 12 U/L (ref 0–53)
AST: 15 U/L (ref 0–37)
Albumin: 3.5 g/dL (ref 3.5–5.2)
Alkaline Phosphatase: 152 U/L — ABNORMAL HIGH (ref 39–117)
BUN: 21 mg/dL (ref 6–23)
CALCIUM: 9.7 mg/dL (ref 8.4–10.5)
CO2: 25 meq/L (ref 19–32)
Chloride: 100 mEq/L (ref 96–112)
Creatinine, Ser: 1.24 mg/dL (ref 0.50–1.35)
GFR, EST AFRICAN AMERICAN: 71 mL/min — AB (ref >90)
GFR, EST NON AFRICAN AMERICAN: 61 mL/min — AB (ref >90)
GLUCOSE: 174 mg/dL — AB (ref 70–99)
Potassium: 4.1 mEq/L (ref 3.7–5.3)
Sodium: 141 mEq/L (ref 137–147)
Total Bilirubin: 0.2 mg/dL — ABNORMAL LOW (ref 0.3–1.2)
Total Protein: 7.3 g/dL (ref 6.0–8.3)

## 2013-04-04 LAB — URINALYSIS, ROUTINE W REFLEX MICROSCOPIC
BILIRUBIN URINE: NEGATIVE
HGB URINE DIPSTICK: NEGATIVE
KETONES UR: NEGATIVE mg/dL
Leukocytes, UA: NEGATIVE
Nitrite: NEGATIVE
Protein, ur: 30 mg/dL — AB
Specific Gravity, Urine: 1.03 (ref 1.005–1.030)
Urobilinogen, UA: 0.2 mg/dL (ref 0.0–1.0)
pH: 6 (ref 5.0–8.0)

## 2013-04-04 LAB — DIFFERENTIAL
Basophils Absolute: 0 10*3/uL (ref 0.0–0.1)
Basophils Relative: 0 % (ref 0–1)
EOS PCT: 2 % (ref 0–5)
Eosinophils Absolute: 0.1 10*3/uL (ref 0.0–0.7)
LYMPHS ABS: 1.5 10*3/uL (ref 0.7–4.0)
LYMPHS PCT: 19 % (ref 12–46)
MONO ABS: 0.8 10*3/uL (ref 0.1–1.0)
Monocytes Relative: 10 % (ref 3–12)
Neutro Abs: 5.5 10*3/uL (ref 1.7–7.7)
Neutrophils Relative %: 69 % (ref 43–77)

## 2013-04-04 LAB — URINE MICROSCOPIC-ADD ON

## 2013-04-04 LAB — I-STAT CHEM 8, ED
BUN: 28 mg/dL — ABNORMAL HIGH (ref 6–23)
CREATININE: 1.4 mg/dL — AB (ref 0.50–1.35)
Calcium, Ion: 1.21 mmol/L (ref 1.13–1.30)
Chloride: 102 mEq/L (ref 96–112)
Glucose, Bld: 169 mg/dL — ABNORMAL HIGH (ref 70–99)
HCT: 41 % (ref 39.0–52.0)
HEMOGLOBIN: 13.9 g/dL (ref 13.0–17.0)
Potassium: 4.3 mEq/L (ref 3.7–5.3)
SODIUM: 140 meq/L (ref 137–147)
TCO2: 29 mmol/L (ref 0–100)

## 2013-04-04 LAB — HEMOGLOBIN A1C
Hgb A1c MFr Bld: 11.6 % — ABNORMAL HIGH (ref <5.7)
Mean Plasma Glucose: 286 mg/dL — ABNORMAL HIGH (ref <117)

## 2013-04-04 LAB — ETHANOL: Alcohol, Ethyl (B): <11 mg/dL (ref 0–11)

## 2013-04-04 LAB — PROTIME-INR
INR: 1.02 (ref 0.00–1.49)
Prothrombin Time: 13.2 seconds (ref 11.6–15.2)

## 2013-04-04 LAB — APTT: aPTT: 26 seconds (ref 24–37)

## 2013-04-04 MED ORDER — ASPIRIN 300 MG RE SUPP
300.0000 mg | Freq: Once | RECTAL | Status: AC
  Administered 2013-04-04: 300 mg via RECTAL
  Filled 2013-04-04: qty 1

## 2013-04-04 MED ORDER — SODIUM CHLORIDE 0.9 % IV SOLN
INTRAVENOUS | Status: DC
  Administered 2013-04-04 – 2013-04-05 (×3): via INTRAVENOUS

## 2013-04-04 MED ORDER — SODIUM CHLORIDE 0.9 % IV SOLN: INTRAVENOUS | Status: DC

## 2013-04-04 MED ORDER — ENOXAPARIN SODIUM 40 MG/0.4ML ~~LOC~~ SOLN
40.0000 mg | SUBCUTANEOUS | Status: DC
  Filled 2013-04-04: qty 0.4

## 2013-04-04 MED ORDER — INSULIN ASPART 100 UNIT/ML ~~LOC~~ SOLN
0.0000 [IU] | SUBCUTANEOUS | Status: DC
  Administered 2013-04-04 (×2): 3 [IU] via SUBCUTANEOUS
  Administered 2013-04-04: 5 [IU] via SUBCUTANEOUS
  Administered 2013-04-05: 3 [IU] via SUBCUTANEOUS
  Administered 2013-04-05: 7 [IU] via SUBCUTANEOUS
  Administered 2013-04-05: 3 [IU] via SUBCUTANEOUS
  Administered 2013-04-05 – 2013-04-06 (×3): 2 [IU] via SUBCUTANEOUS
  Administered 2013-04-06: 3 [IU] via SUBCUTANEOUS
  Administered 2013-04-06: 5 [IU] via SUBCUTANEOUS
  Administered 2013-04-06: 1 [IU] via SUBCUTANEOUS
  Administered 2013-04-06: 3 [IU] via SUBCUTANEOUS
  Administered 2013-04-07: 7 [IU] via SUBCUTANEOUS
  Administered 2013-04-07 (×2): 1 [IU] via SUBCUTANEOUS
  Administered 2013-04-07 (×2): 2 [IU] via SUBCUTANEOUS
  Administered 2013-04-08: 5 [IU] via SUBCUTANEOUS

## 2013-04-04 MED ORDER — ACETAMINOPHEN 650 MG RE SUPP: 650.0000 mg | RECTAL | Status: DC | PRN

## 2013-04-04 MED ORDER — STUDY - INVESTIGATIONAL DRUG SIMPLE RECORD
600.0000 mg | Status: AC
  Administered 2013-04-04: 600 mg via ORAL
  Filled 2013-04-04: qty 600

## 2013-04-04 MED ORDER — ASPIRIN 300 MG RE SUPP
300.0000 mg | Freq: Every day | RECTAL | Status: DC
  Filled 2013-04-04 (×5): qty 1

## 2013-04-04 MED ORDER — ASPIRIN 81 MG PO CHEW
324.0000 mg | CHEWABLE_TABLET | Freq: Once | ORAL | Status: DC
  Filled 2013-04-04: qty 4

## 2013-04-04 MED ORDER — ASPIRIN 325 MG PO TABS
325.0000 mg | ORAL_TABLET | Freq: Every day | ORAL | Status: DC
  Administered 2013-04-05 – 2013-04-09 (×5): 325 mg via ORAL
  Filled 2013-04-04 (×5): qty 1

## 2013-04-04 MED ORDER — STUDY - INVESTIGATIONAL DRUG SIMPLE RECORD
75.0000 mg | Freq: Every day | Status: DC
  Administered 2013-04-05 – 2013-04-09 (×5): 75 mg via ORAL
  Filled 2013-04-04 (×2): qty 75

## 2013-04-04 MED ORDER — ACETAMINOPHEN 325 MG PO TABS: 650.0000 mg | ORAL_TABLET | ORAL | Status: DC | PRN

## 2013-04-04 NOTE — Care Management Note (Signed)
    Page 1 of 2   04/07/2013     4:08:55 PM   CARE MANAGEMENT NOTE 04/07/2013  Patient:  Manuel Higgins,Manuel Higgins   Account Number:  [id number]  Date Initiated:  04/04/2013  Documentation initiated by:  GRAVES-BIGELOW,Yazmin Locher  Subjective/Objective Assessment:   Pt admitted for Garbled speech with facial drooping this morning. MRI + for Multiple acute and subacute small vessel infarcts in the bilateral cerebral hemispheres.  [facility]     Action/Plan:   CM will continue to monitor for disposition needs.   Anticipated DC Date:  04/07/2013   Anticipated DC Plan:  HOME W HOME HEALTH SERVICES      DC Planning Services  CM consult      [facility]  [facility]   Choice offered to / List presented to:  C-2 HC POA / Guardian        HH arranged  HH-1 RN  HH-10 DISEASE MANAGEMENT  HH-2 PT  HH-3 OT      HH agency  [facility]   Status of service:  Completed, signed off Medicare Important Message given?   (If response is "NO", the following Medicare IM given date fields will be blank) Date Medicare IM given:   Date Additional Medicare IM given:    Discharge Disposition:  HOME W HOME HEALTH SERVICES  Per UR Regulation:  Reviewed for med. necessity/level of care/duration of stay  If discussed at Long Length of Stay Meetings, dates discussed:    Comments:  [phone] Darryl Rivers [facility]  [facility]. SOC to begin within 24-48 hrs post d/c. Tomi BambergerBrenda Graves- Bigelow, RN,BSN [phone]

## 2013-04-04 NOTE — ED Notes (Signed)
[facility] [address] [place]  [phone]  Patient states that he takes a BP pill but [facility] has no record of him having a prescription.  Uses insulin

## 2013-04-04 NOTE — ED Notes (Signed)
Speech garbled but patient able to answers question appropriately.

## 2013-04-04 NOTE — Evaluation (Signed)
Speech Language Pathology Evaluation Patient Details Name: Manuel PanderDarrell Brents MRN: [id number] DOB: 07/10/1950 Today's Date: 04/04/2013 Time: 0950-1000 SLP Time Calculation (min): 10 min  Problem List:  Patient Active Problem List   Diagnosis Date Noted  . CVA (cerebral infarction) 04/04/2013  . Acute encephalopathy 04/04/2013  . Acute renal failure 04/04/2013  . Dysphagia 04/04/2013  . Diabetes 02/16/2013  . Encephalopathy, hypertensive 02/16/2013  . Callus of foot 02/16/2013  . Hypertensive crisis 02/15/2013   Past Medical History:  Past Medical History  Diagnosis Date  . Hypertension   . Diabetes mellitus without complication    Past Surgical History:  Past Surgical History  Procedure Laterality Date  . Back surgery    . Tonsillectomy     HPI:  63 year old male with with history of hypertension, insulin-dependent diabetes mellitus, who [facility] was seen loss normal at 11:30 p.m. at night before going to bed. [facility] noted right-sided facial drooping and garbled speech this morning, at 5:15 AM when he woke up.  CT No acute abnormality.    Assessment / Plan / Recommendation Clinical Impression  Pt. demonstrates mild dysarthria marked by consonant distortion.  Intelligibility will be challenged in noisy environment.  Verbal responses regarding cognitive abilities appear functional, however suspect difficutly during performance of ADL's.  SLP will follow to facilitate speech intelligibilty and diagnostically further assess cogntive abilities.    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)   Cognition  Overall Cognitive Status: Within Functional Limits for tasks assessed Arousal/Alertness: Awake/alert Orientation Level: Oriented X4 Attention: Sustained Sustained Attention: Appears intact Memory:  (TBA) Awareness: Appears intact Problem Solving:  (verbal appears intact, will assess further) Safety/Judgment:  (will assess further)    Comprehension  Auditory Comprehension Overall Auditory Comprehension: Appears within functional limits for tasks assessed Visual Recognition/Discrimination Discrimination: Not tested Reading Comprehension Reading Status: Not tested    Expression Expression Primary Mode of Expression: Verbal Verbal Expression Overall Verbal Expression: Appears within functional limits for tasks assessed Written Expression Dominant Hand: Right Written Expression:  (will assess)   Oral / Motor Oral Motor/Sensory Function Overall Oral Motor/Sensory Function: Impaired Labial ROM: Reduced right Labial Symmetry: Abnormal symmetry right Labial Strength: Reduced Lingual ROM: Within Functional Limits Lingual Symmetry: Within Functional Limits Lingual Strength: Within Functional Limits Facial ROM: Within Functional Limits Facial Symmetry: Within Functional Limits Facial Strength: Within Functional Limits Velum: Within Functional Limits Mandible: Within Functional Limits Motor Speech Overall Motor Speech: Impaired Respiration: Within functional limits Phonation: Normal Resonance: Within functional limits Articulation: Within functional limitis Intelligibility: Intelligibility reduced Word: 75-100% accurate Phrase: 75-100% accurate Sentence: 75-100% accurate Conversation: 75-100% accurate Motor Planning: Witnin functional limits   GO     Breck CoonsLisa Willis [employer] CorporationLitaker M.Ed [employer]-SLP Pager [phone]  04/04/2013

## 2013-04-04 NOTE — ED Notes (Signed)
[facility]  Staff reported to EMS that the patient was last seen normal approximately 2330.  Left facial droop and garbled speech.

## 2013-04-04 NOTE — Progress Notes (Signed)
UR Completed Daphnee Preiss Graves-Bigelow, RN,BSN [phone]  

## 2013-04-04 NOTE — Progress Notes (Signed)
*  PRELIMINARY RESULTS* Vascular Ultrasound Carotid Duplex (Doppler) has been completed.  Findings suggest 1-39% internal carotid artery stenosis bilaterally. Vertebral arteries are patent with antegrade flow.  04/04/2013 3:55 PM Gertie FeyMichelle Christophere Hillhouse, RVT, RDCS, RDMS

## 2013-04-04 NOTE — H&P (Signed)
History and Physical       Hospital Admission Note Date: 04/04/2013  Patient name: Manuel Higgins Medical record number: [id number] Date of birth: Jul 31, 1950 Age: 63 y.o. Gender: male  PCP: Bernerd Limbo    Chief Complaint:  Garbled speech with facial drooping this morning  HPI: Patient is a 63 year old male with with history of hypertension, insulin-dependent diabetes mellitus, who is currently at resident of [facility] (? Group home) was seen loss normal at 11:30 p.m. at night before going to bed. This morning the [facility] staff noted right-sided facial drooping and garbled speech this morning, at  5:15 AM when he woke up. The patient still has garbled speech and is not able to provide a clear history himself. He denies any prior history of stroke. Neurology was consulted in the ER, patient was not consider TPA candidate due to late presentation. Patient was recommended admission for stroke workup.  Review of Systems:  Patient is a limited historian (? Baseline mental status) and unable to provide complete history himself Gen.: Patient denied any fevers or chills or loss of appetite HEENT: Denies any current sore throat or recent illness CVS/cardiovascular: Denies any chest pain or shortness of breath Respiratory: Denies any chest pain, shortness of breath or wheezing Gastroenterology: Denied any nausea, vomiting, abdominal pain Musculoskeletal: Denies any back pain, states that he ambulates by himself  Past Medical History: Past Medical History  Diagnosis Date  . Hypertension   . Diabetes mellitus without complication    History reviewed. No pertinent past surgical history.  Medications: Prior to Admission medications   Medication Sig Start Date End Date Taking? Authorizing Provider  insulin aspart protamine- aspart (NOVOLOG MIX 70/30) (70-30) 100 UNIT/ML injection Inject 20 Units into the skin 2 (two) times daily.    Yes Historical Provider, MD  amLODipine (NORVASC) 10 MG tablet Take 1 tablet (10 mg total) by mouth daily. 02/16/13   Andrena Mews, DO  lisinopril (PRINIVIL,ZESTRIL) 10 MG tablet Take 1 tablet (10 mg total) by mouth daily. 02/16/13   Andrena Mews, DO  naphazoline (NAPHCON) 0.1 % ophthalmic solution Place 1 drop into both eyes 4 (four) times daily as needed for irritation. 02/16/13   Andrena Mews, DO  terbinafine (LAMISIL) 250 MG tablet Take 1 tablet (250 mg total) by mouth daily. 03/26/13   Rodolph Bong, MD    Allergies:  No Known Allergies  Social History:  reports that he has been smoking.  He states that he occasionally smokes. He does not have any smokeless tobacco history on file. He reports that he does not drink alcohol or use illicit drugs.he is currently in a group home and states that he ambulates by himself  Family History: History reviewed. No pertinent family history.  Physical Exam: Blood pressure 143/84, pulse 81, temperature 97.8 F (36.6 C), resp. rate 11, height 5\' 9"  (1.753 m), weight 78.019 kg (172 lb), SpO2 99.00%. General: Alert, awake, oriented but garbled speech with dysarthria, able to follow commands, right facial drooping HEENT: normocephalic, atraumatic, anicteric sclera, pink conjunctiva, pupils equal and reactive to light and accomodation, oropharynx clear Neck: supple, no masses or lymphadenopathy, no goiter, no bruits  Heart: Regular rate and rhythm, without murmurs, rubs or gallops. Lungs: Clear to auscultation bilaterally, no wheezing, rales or rhonchi. Abdomen: Soft, nontender, nondistended, positive bowel sounds, no masses. Extremities: No clubbing, cyanosis or edema with positive pedal pulses. Neuro: Dysarthria with right facial drooping, normal finger-to-nose, strength 5/ 5 in upper and lower  extremities bilaterally Psych: alert and oriented , normal mood and affect Skin: no rashes or lesions, warm and dry   LABS on Admission:  Basic Metabolic  Panel:  Recent Labs Lab 04/04/13 0539 04/04/13 0551  NA 141 140  K 4.1 4.3  CL 100 102  CO2 25  --   GLUCOSE 174* 169*  BUN 21 28*  CREATININE 1.24 1.40*  CALCIUM 9.7  --    Liver Function Tests:  Recent Labs Lab 04/04/13 0539  AST 15  ALT 12  ALKPHOS 152*  BILITOT 0.2*  PROT 7.3  ALBUMIN 3.5   No results found for this basename: LIPASE, AMYLASE,  in the last 168 hours No results found for this basename: AMMONIA,  in the last 168 hours CBC:  Recent Labs Lab 04/04/13 0539 04/04/13 0551  WBC 7.9  --   NEUTROABS 5.5  --   HGB 13.3 13.9  HCT 37.5* 41.0  MCV 89.3  --   PLT 192  --    Cardiac Enzymes: No results found for this basename: CKTOTAL, CKMB, CKMBINDEX, TROPONINI,  in the last 168 hours BNP: No components found with this basename: POCBNP,  CBG: No results found for this basename: GLUCAP,  in the last 168 hours   Radiological Exams on Admission: Ct Head Wo Contrast  04/04/2013   CLINICAL DATA:  Right facial droop.  EXAM: CT HEAD WITHOUT CONTRAST  TECHNIQUE: Contiguous axial images were obtained from the base of the skull through the vertex without intravenous contrast.  COMPARISON:  Head CT scan 02/15/2013.  FINDINGS: There is cortical atrophy and chronic microvascular ischemic change. No evidence of acute intracranial abnormality including infarct, hemorrhage, mass lesion, mass effect, midline shift or abnormal extra-axial fluid collection. No hydrocephalus or pneumocephalus. Calvarium intact. Imaged paranasal sinuses are clear. Very small right mastoid effusion is noted, unchanged.  IMPRESSION: No acute abnormality.  Atrophy and chronic microvascular ischemic change. Stable compared to prior exam.  Critical Value/emergent results were called by telephone at the time of interpretation on 04/04/2013 at 5:28 AM to Dr. Dierdre HighmanPITZ, who verbally acknowledged these results.   Electronically Signed   By: Drusilla Kannerhomas  Dalessio M.D.   On: 04/04/2013 05:28     Assessment/Plan Principal Problem:   CVA (cerebral infarction): Multiple risk factors including hypertension, diabetes mellitus - Will admit patient for full stroke workup, obtain MRI/MRA of the brain - 2-D echo, carotid Dopplers - PT, OT, speech/cognitive evaluation, swallow evaluation, patient has failed RN stroke swallow screen - Will place on aspirin suppository for now - Serial neurochecks, hemoglobin A1c, lipid panel   Active Problems:   Diabetes - For now place on sliding scale insulin, while n.p.o. - CBGs every 4 hours    Acute renal failure - Will place on gentle hydration    Dysphagia - Will have speech therapy evaluate patient for swallowing evaluation   DVT prophylaxis: Lovenox   CODE STATUS: full CODE STATUS   Family Communication: No family member is present at bedside. Patient does understand the plan and management, CODE STATUS.   Further plan will depend as patient's clinical course evolves and further radiologic and laboratory data become available.   Time Spent on Admission: 1 hour   Helix Lafontaine M.D. [facility] 04/04/2013, 8:17 AM Pager: [phone]  If 7PM-7AM, please contact night-coverage [email or web address] Password [email or web address]

## 2013-04-04 NOTE — Consult Note (Signed)
Referring Physician: Dr. Dierdre Highmanpitz    Chief Complaint: Facial droop and slurred speech of new onset.  HPI: Charlynne PanderDarrell Mccoy is an 63 y.o. male with a history of hypertension and diabetes mellitus presenting with new onset slurred speech and right facial droop. No history of stroke nor TIA. Patient had no extremity weakness. CT scan of the head showed no acute intracranial abnormality. NIH stroke score was 2. Patient was last known well at 2330 on 04/03/2013.  LSN: [id number] on 04/03/2013 tPA Given: No: Mild deficits; beyond time window for treatment consideration MRankin: 1  Past Medical History  Diagnosis Date  . Hypertension   . Diabetes mellitus without complication     History reviewed. No pertinent family history.   Medications: I have reviewed the patient's current medications.  ROS: History obtained from unobtainable from patient due to mental status   Physical Examination: Blood pressure 119/64, pulse 82, resp. rate 18, height 5\' 9"  (1.753 m), weight 78.019 kg (172 lb), SpO2 98.00%.  Neurologic Examination: Mental Status: Alert, oriented, somewhat slow to respond.  Speech dysarthric without evidence of aphasia. Able to follow commands without difficulty. Cranial Nerves: II-Visual fields were normal. III/IV/VI-Pupils were equal and reacted. Extraocular movements were full and conjugate.    V/VII-no facial numbness; mild right lower facial weakness. VIII-normal. X-mild to moderate dysarthria. XII-midline tongue extension Motor: 5/5 bilaterally with normal tone and bulk Sensory: Normal throughout. Deep Tendon Reflexes: 2+ and symmetric. Plantars: Flexor bilaterally Cerebellar: Normal finger-to-nose testing.  Ct Head Wo Contrast  04/04/2013   CLINICAL DATA:  Right facial droop.  EXAM: CT HEAD WITHOUT CONTRAST  TECHNIQUE: Contiguous axial images were obtained from the base of the skull through the vertex without intravenous contrast.  COMPARISON:  Head CT scan 02/15/2013.   FINDINGS: There is cortical atrophy and chronic microvascular ischemic change. No evidence of acute intracranial abnormality including infarct, hemorrhage, mass lesion, mass effect, midline shift or abnormal extra-axial fluid collection. No hydrocephalus or pneumocephalus. Calvarium intact. Imaged paranasal sinuses are clear. Very small right mastoid effusion is noted, unchanged.  IMPRESSION: No acute abnormality.  Atrophy and chronic microvascular ischemic change. Stable compared to prior exam.  Critical Value/emergent results were called by telephone at the time of interpretation on 04/04/2013 at 5:28 AM to Dr. Dierdre HighmanPITZ, who verbally acknowledged these results.   Electronically Signed   By: Drusilla Kannerhomas  Dalessio M.D.   On: 04/04/2013 05:28    Assessment: 63 y.o. male with multiple risk factors for stroke presenting with possible acute left subcortical TIA or stroke.  Stroke Risk Factors - diabetes mellitus and hypertension  Plan: 1. HgbA1c, fasting lipid panel 2. MRI, MRA  of the brain without contrast 3. PT consult, OT consult, Speech consult 4. Echocardiogram 5. Carotid dopplers 6. Prophylactic therapy-Antiplatelet med: Aspirin 7. Risk factor modification 8. Telemetry monitoring   C.R. Roseanne RenoStewart, MD [facility] [phone]  04/04/2013, 5:43 AM

## 2013-04-04 NOTE — Code Documentation (Signed)
63 yo bm brought in via [facility] for sluured speech & facial droop.  Pt LKW 2330.  Lives in a group home.  NIH 2.  See doc flowsheet for code stroke times. Not a candidate for acute treatment due to outside window for tPA

## 2013-04-04 NOTE — Evaluation (Signed)
Clinical/Bedside Swallow Evaluation Patient Details  Name: Manuel Higgins MRN: [id number] Date of Birth: 10/19/1950  Today's Date: 04/04/2013 Time: 0940-1000 SLP Time Calculation (min): 20 min  Past Medical History:  Past Medical History  Diagnosis Date  . Hypertension   . Diabetes mellitus without complication    Past Surgical History:  Past Surgical History  Procedure Laterality Date  . Back surgery    . Tonsillectomy     HPI:  63 year old male with with history of hypertension, insulin-dependent diabetes mellitus, who [facility] was seen loss normal at 11:30 p.m. at night before going to bed. [facility] noted right-sided facial drooping and garbled speech this morning, at 5:15 AM when he woke up.  CT No acute abnormality.    Assessment / Plan / Recommendation Clinical Impression  Pt. consumed various textures without oropharyngeal impairments or s/s aspiration.  Aspiration risk mildly increased due to current stroke.  Recommend regular texture diet texture and thin liquids, straws okay and pills with liquid.  No folllow up required for swallow.      Aspiration Risk  Mild    Diet Recommendation Regular;Thin liquid   Liquid Administration via: Cup;Straw Medication Administration: Whole meds with liquid Supervision: Patient able to self feed;Intermittent supervision to cue for compensatory strategies Compensations: Slow rate;Small sips/bites Postural Changes and/or Swallow Maneuvers: Seated upright 90 degrees    Other  Recommendations Oral Care Recommendations: Oral care BID   Follow Up Recommendations  None    Frequency and Duration        Pertinent Vitals/Pain WDL         Swallow Study         Oral/Motor/Sensory Function Overall Oral Motor/Sensory Function: Impaired Labial ROM: Reduced right Labial Symmetry: Abnormal symmetry right Labial Strength: Reduced Lingual ROM: Within Functional  Limits Lingual Symmetry: Within Functional Limits Lingual Strength: Within Functional Limits Facial ROM: Within Functional Limits Facial Symmetry: Within Functional Limits Facial Strength: Within Functional Limits Velum: Within Functional Limits Mandible: Within Functional Limits   Ice Chips Ice chips: Within functional limits   Thin Liquid Thin Liquid: Within functional limits Presentation: Cup;Straw    Nectar Thick Nectar Thick Liquid: Not tested   Honey Thick Honey Thick Liquid: Not tested   Puree Puree: Within functional limits   Solid   GO    Solid: Within functional limits       Royce MacadamiaLisa Willis Glennette Galster M.Ed [employer]-SLP Pager [phone]  04/04/2013

## 2013-04-04 NOTE — ED Provider Notes (Signed)
CSN: [id number]     Arrival date & time 04/04/13  0507 History   First MD Initiated Contact with Patient 04/04/13 0515     Chief Complaint  Patient presents with  . Code Stroke    An emergency department physician performed an initial assessment on this suspected stroke patient at [id number]. (Consider location/radiation/quality/duration/timing/severity/associated sxs/prior Treatment) HPI History provided by EMS, group home staff and per patient. Last seen normal reportedly at 11:30 PM prior to going to bed. Waking up this morning was noted to have garbled speech and facial droop. EMS was called. On arrival to the emergency department, code stroke was called. Patient denies any weakness or numbness. He denies any headache. He is a limited historian but denies any history of similar symptoms in the past. Symptoms moderate in severity.   Past Medical History  Diagnosis Date  . Hypertension   . Diabetes mellitus without complication    History reviewed. No pertinent past surgical history. History reviewed. No pertinent family history. History  Substance Use Topics  . Smoking status: Current Every Day Smoker -- 0.33 packs/day  . Smokeless tobacco: Not on file  . Alcohol Use: No     Comment: Denies Currently but previously marked as yes    Review of Systems  Constitutional: Negative for fever and chills.  Eyes: Negative for visual disturbance.  Respiratory: Negative for shortness of breath.   Cardiovascular: Negative for chest pain.  Gastrointestinal: Negative for vomiting and abdominal pain.  Genitourinary: Negative for flank pain.  Musculoskeletal: Negative for back pain.  Skin: Negative for rash.  Neurological: Positive for facial asymmetry and speech difficulty. Negative for headaches.  All other systems reviewed and are negative.      Allergies  Review of patient's allergies indicates no known allergies.  Home Medications   Current Outpatient Rx  Name  Route  Sig  Dispense   Refill  . amLODipine (NORVASC) 10 MG tablet   Oral   Take 1 tablet (10 mg total) by mouth daily.   30 tablet   0   . insulin aspart protamine- aspart (NOVOLOG MIX 70/30) (70-30) 100 UNIT/ML injection   Subcutaneous   Inject 20 Units into the skin 2 (two) times daily.         Marland Kitchen. lisinopril (PRINIVIL,ZESTRIL) 10 MG tablet   Oral   Take 1 tablet (10 mg total) by mouth daily.   30 tablet   0   . naphazoline (NAPHCON) 0.1 % ophthalmic solution   Both Eyes   Place 1 drop into both eyes 4 (four) times daily as needed for irritation.   15 mL   0   . terbinafine (LAMISIL) 250 MG tablet   Oral   Take 1 tablet (250 mg total) by mouth daily.   14 tablet   0    BP 119/64  Pulse 82  Resp 18  Ht 5\' 9"  (1.753 m)  Wt 172 lb (78.019 kg)  BMI 25.39 kg/m2  SpO2 98% Physical Exam  Constitutional: He is oriented to person, place, and time. He appears well-developed and well-nourished.  HENT:  Head: Normocephalic and atraumatic.  Eyes: EOM are normal. Pupils are equal, round, and reactive to light.  Neck: Neck supple.  Cardiovascular: Normal rate, regular rhythm and intact distal pulses.   Pulmonary/Chest: Effort normal and breath sounds normal. No respiratory distress.  Abdominal: Soft. He exhibits no distension. There is no tenderness.  Musculoskeletal: Normal range of motion. He exhibits no edema.  Neurological: He is alert  and oriented to person, place, and time. Coordination normal.  Dysarthria with left facial droop/facial asymmetry. No pronator drift. Equal grips, biceps, triceps. Equal dorsi plantar flexion. Sensorium to light touch equal and intact x4.  Skin: Skin is warm and dry.    ED Course  Procedures (including critical care time) Labs Review Labs Reviewed  CBC - Abnormal; Notable for the following:    RBC 4.20 (*)    HCT 37.5 (*)    All other components within normal limits  I-STAT CHEM 8, ED - Abnormal; Notable for the following:    BUN 28 (*)    Creatinine,  Ser 1.40 (*)    Glucose, Bld 169 (*)    All other components within normal limits  DIFFERENTIAL  ETHANOL  PROTIME-INR  APTT  COMPREHENSIVE METABOLIC PANEL  URINE RAPID DRUG SCREEN (HOSP PERFORMED)  URINALYSIS, ROUTINE W REFLEX MICROSCOPIC  I-STAT TROPOININ, ED  I-STAT TROPOININ, ED   Imaging Review Ct Head Wo Contrast  04/04/2013   CLINICAL DATA:  Right facial droop.  EXAM: CT HEAD WITHOUT CONTRAST  TECHNIQUE: Contiguous axial images were obtained from the base of the skull through the vertex without intravenous contrast.  COMPARISON:  Head CT scan 02/15/2013.  FINDINGS: There is cortical atrophy and chronic microvascular ischemic change. No evidence of acute intracranial abnormality including infarct, hemorrhage, mass lesion, mass effect, midline shift or abnormal extra-axial fluid collection. No hydrocephalus or pneumocephalus. Calvarium intact. Imaged paranasal sinuses are clear. Very small right mastoid effusion is noted, unchanged.  IMPRESSION: No acute abnormality.  Atrophy and chronic microvascular ischemic change. Stable compared to prior exam.  Critical Value/emergent results were called by telephone at the time of interpretation on 04/04/2013 at 5:28 AM to Dr. Dierdre Highman, who verbally acknowledged these results.   Electronically Signed   By: Drusilla Kanner M.D.   On: 04/04/2013 05:28     EKG Interpretation   Date/Time:  Friday April 04 2013 05:45:26 EDT Ventricular Rate:  76 PR Interval:  168 QRS Duration: 104 QT Interval:  419 QTC Calculation: 471 R Axis:   73 Text Interpretation:  Sinus rhythm Left ventricular hypertrophy Repol  abnrm suggests ischemia, diffuse leads ST elevation  Confirmed by Tiffanyann Deroo   MD, Yazlin Ekblad ([id number]) on 04/04/2013 5:55:09 AM     Code stroke called at time of arrival to the emergency department. CT scan obtained. Neurology evaluated bedside. No change in symptoms. Plan admit to medical service.  MDM   Diagnosis: CVA  EKG, labs, CT brain Code  stroke Neurology evaluation MED admit    Sunnie Nielsen, MD 04/04/13 [phone]

## 2013-04-04 NOTE — Progress Notes (Signed)
*  PRELIMINARY RESULTS* Echocardiogram 2D Echocardiogram has been performed.  Manuel Higgins, Manuel Higgins 04/04/2013, 3:50 PM

## 2013-04-05 ENCOUNTER — Inpatient Hospital Stay (HOSPITAL_COMMUNITY): Payer: Medicaid Other

## 2013-04-05 DIAGNOSIS — R1310 Dysphagia, unspecified: Secondary | ICD-10-CM

## 2013-04-05 LAB — GLUCOSE, CAPILLARY
GLUCOSE-CAPILLARY: 170 mg/dL — AB (ref 70–99)
GLUCOSE-CAPILLARY: 179 mg/dL — AB (ref 70–99)
GLUCOSE-CAPILLARY: 311 mg/dL — AB (ref 70–99)
GLUCOSE-CAPILLARY: 205 mg/dL — AB (ref 70–99)
GLUCOSE-CAPILLARY: 429 mg/dL — AB (ref 70–99)

## 2013-04-05 LAB — HEMOGLOBIN A1C
Hgb A1c MFr Bld: 11.4 % — ABNORMAL HIGH (ref <5.7)
Mean Plasma Glucose: 280 mg/dL — ABNORMAL HIGH (ref <117)

## 2013-04-05 LAB — LIPID PANEL
CHOL/HDL RATIO: 3.5 ratio
Cholesterol: 173 mg/dL (ref 0–200)
HDL: 49 mg/dL (ref >39)
LDL Cholesterol: 101 mg/dL — ABNORMAL HIGH (ref 0–99)
Triglycerides: 113 mg/dL (ref <150)
VLDL: 23 mg/dL (ref 0–40)

## 2013-04-05 MED ORDER — INSULIN ASPART 100 UNIT/ML ~~LOC~~ SOLN
8.0000 [IU] | Freq: Once | SUBCUTANEOUS | Status: AC
  Administered 2013-04-05: 8 [IU] via SUBCUTANEOUS

## 2013-04-05 MED ORDER — SIMVASTATIN 10 MG PO TABS
10.0000 mg | ORAL_TABLET | Freq: Every day | ORAL | Status: DC
  Administered 2013-04-05 – 2013-04-08 (×4): 10 mg via ORAL
  Filled 2013-04-05 (×5): qty 1

## 2013-04-05 MED ORDER — HYDRALAZINE HCL 20 MG/ML IJ SOLN
10.0000 mg | Freq: Three times a day (TID) | INTRAMUSCULAR | Status: DC | PRN
  Administered 2013-04-06: 10 mg via INTRAVENOUS
  Filled 2013-04-05: qty 1

## 2013-04-05 MED ORDER — INSULIN GLARGINE 100 UNIT/ML ~~LOC~~ SOLN
15.0000 [IU] | Freq: Every day | SUBCUTANEOUS | Status: DC
  Administered 2013-04-05 – 2013-04-07 (×3): 15 [IU] via SUBCUTANEOUS
  Filled 2013-04-05 (×4): qty 0.15

## 2013-04-05 NOTE — Progress Notes (Signed)
PT Evaluation Note   04/05/13 1109  PT Visit Information  Last PT Received On 04/05/13  Assistance Needed +1  History of Present Illness 63 y.o. male [facility] with right sided weakness and difficulty speaking.  He was admitted for stroke workup.  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    Precautions  Precautions Fall  Home Living  Family/patient expects to be discharged to: Private residence  Living Arrangements Other (Comment) ([name])  Available Help at Discharge Other (Comment) (group home workers?)  Type of Home House  Home Equipment None  Prior Function  Level of Independence Independent  Comments Pt reports he has never used an assistive device to walk.    Communication  Communication Other (comment) (see SLP assessment)  Cognition  Arousal/Alertness Awake/alert  Behavior During Therapy Flat affect  Overall Cognitive Status No family/caregiver present to determine baseline cognitive functioning (decreased safety awareness and decreased awareness-deficits)  Upper Extremity Assessment  Upper Extremity Assessment Overall WFL for tasks assessed  Lower Extremity Assessment  Lower Extremity Assessment Overall WFL for tasks assessed  Cervical / Trunk Assessment  Cervical / Trunk Assessment Normal  Bed Mobility  Overal bed mobility Modified Independent  General bed mobility comments Using the railing  Transfers  Overall transfer level Needs assistance  Equipment used None  Transfers Sit to/from Stand  Sit to Stand Supervision  General transfer comment supervision for safety  Ambulation/Gait  Ambulation/Gait assistance Supervision;Min guard  Ambulation Distance (Feet) 200 Feet  Assistive device None  Gait Pattern/deviations Step-through pattern;Drifts right/left;Staggering right  General Gait Details Supervision to min guard assist.  Pt running into the wall on the  right side, drifting right and has generally staggering gait pattern.  Min guard to ensure no LOB. I discussed using a cane for safety and balance and he did not seem receptive to the idea.  He reports that he thinks his balance is "good"  Balance  Overall balance assessment Needs assistance  Sitting-balance support Feet supported  Sitting balance-Leahy Scale Good  Standing balance support No upper extremity supported  Standing balance-Leahy Scale Good  Standardized Balance Assessment  Standardized Balance Assessment  Berg Balance Test  Berg Balance Test  Sit to Stand 4  Standing Unsupported 4  Sitting with Back Unsupported but Feet Supported on Floor or Stool 4  Stand to Sit 4  Transfers 4  Standing Unsupported with Eyes Closed 3  Standing Ubsupported with Feet Together 4  From Standing, Reach Forward with Outstretched Arm 3  From Standing Position, Pick up Object from Floor 3  From Standing Position, Turn to Look Behind Over each Shoulder 4  Turn 360 Degrees 2  Standing Unsupported, One Foot in Front 2  PT - End of Session  Equipment Utilized During Treatment Gait belt  Activity Tolerance Patient tolerated treatment well  Patient left in bed;with call bell/phone within reach;with bed alarm set  PT Assessment  PT Recommendation/Assessment Patient needs continued PT services  PT Problem List Decreased balance;Decreased cognition;Decreased knowledge of use of DME;Decreased safety awareness;Decreased knowledge of precautions  Barriers to Discharge Other (comment)  Barriers to Discharge Comments Questionable what pt's baseline level of mobility and cognition were PTA, also, will need to make sure [facility] can [facility].    PT Therapy Diagnosis  Difficulty walking;Abnormality of gait;Altered mental status  PT Plan  PT Frequency Min 4X/week  PT Treatment/Interventions DME instruction;Gait  training;Stair training;Functional mobility training;Therapeutic activities;Therapeutic  exercise;Balance training;Neuromuscular re-education;Cognitive remediation;Patient/family education  PT Recommendation  Follow Up Recommendations Home health PT;Supervision/Assistance - 24 hour  PT equipment Cane (pt may refuse to use)  Individuals Consulted  Consulted and Agree with Results and Recommendations Patient  Acute Rehab PT Goals  Patient Stated Goal did not state  PT Goal Formulation With patient  Time For Goal Achievement 04/19/13  Potential to Achieve Goals Good  PT Time Calculation  PT Start Time 1106  PT Stop Time 1121  PT Time Calculation (min) 15 min  PT General Charges  $$ ACUTE PT VISIT 1 Procedure  PT Evaluation  $Initial PT Evaluation Tier I 1 Procedure  PT Treatments  $Gait Training 8-22 mins  Written Expression  Dominant Hand Right  Julien Oscar B. Josalynn Johndrow, PT, DPT [phone]

## 2013-04-05 NOTE — Progress Notes (Signed)
Stroke Team Progress Note  HISTORY  63 y.o. male with a history of hypertension and diabetes mellitus presenting with new onset slurred speech and right facial droop. No history of stroke nor TIA. Patient had no extremity weakness. CT scan of the head showed no acute intracranial abnormality. NIH stroke score was 2.    SUBJECTIVE States speech still is still not baseline as well as R facial droop  OBJECTIVE Most recent Vital Signs: Filed Vitals:   04/05/13 0017 04/05/13 0335 04/05/13 0812 04/05/13 0913  BP: 158/81 143/75 120/67 138/60  Pulse: 73 87 92 84  Temp:  98.3 F (36.8 C) 98.1 F (36.7 C) 98.4 F (36.9 C)  TempSrc:  Oral Oral Oral  Resp: 16 18 18 18   Height:      Weight:  75.322 kg (166 lb 0.9 oz)    SpO2: 97% 98% 98% 99%   CBG (last 3)   Recent Labs  04/04/13 2344 04/05/13 0339 04/05/13 0736  GLUCAP 202* 170* 179*    IV Fluid Intake:   . sodium chloride 75 mL/hr at 04/05/13 0523    MEDICATIONS  . aspirin  324 mg Oral Once  . aspirin  300 mg Rectal Daily   Or  . aspirin  325 mg Oral Daily  . insulin aspart  0-9 Units Subcutaneous 6 times per day  . research study medication  75 mg Oral Q breakfast   PRN:  acetaminophen, acetaminophen  Diet:  Dysphagia Activity:   Up with assistance DVT Prophylaxis:  SCDs  CLINICALLY SIGNIFICANT STUDIES Basic Metabolic Panel:  Recent Labs Lab 04/04/13 0539 04/04/13 0551  NA 141 140  K 4.1 4.3  CL 100 102  CO2 25  --   GLUCOSE 174* 169*  BUN 21 28*  CREATININE 1.24 1.40*  CALCIUM 9.7  --    Liver Function Tests:  Recent Labs Lab 04/04/13 0539  AST 15  ALT 12  ALKPHOS 152*  BILITOT 0.2*  PROT 7.3  ALBUMIN 3.5   CBC:  Recent Labs Lab 04/04/13 0539 04/04/13 0551  WBC 7.9  --   NEUTROABS 5.5  --   HGB 13.3 13.9  HCT 37.5* 41.0  MCV 89.3  --   PLT 192  --    Coagulation:  Recent Labs Lab 04/04/13 0539  LABPROT 13.2  INR 1.02   Cardiac Enzymes: No results found for this basename: CKTOTAL,  CKMB, CKMBINDEX, TROPONINI,  in the last 168 hours Urinalysis:  Recent Labs Lab 04/04/13 0838  COLORURINE YELLOW  LABSPEC 1.030  PHURINE 6.0  GLUCOSEU >1000*  HGBUR NEGATIVE  BILIRUBINUR NEGATIVE  KETONESUR NEGATIVE  PROTEINUR 30*  UROBILINOGEN 0.2  NITRITE NEGATIVE  LEUKOCYTESUR NEGATIVE   Lipid Panel    Component Value Date/Time   CHOL 173 04/05/2013 0309   TRIG 113 04/05/2013 0309   HDL 49 04/05/2013 0309   CHOLHDL 3.5 04/05/2013 0309   VLDL 23 04/05/2013 0309   LDLCALC 101* 04/05/2013 0309   HgbA1C  Lab Results  Component Value Date   HGBA1C 11.6* 04/04/2013    Urine Drug Screen:     Component Value Date/Time   LABOPIA NONE DETECTED 04/04/2013 0838   COCAINSCRNUR NONE DETECTED 04/04/2013 0838   LABBENZ NONE DETECTED 04/04/2013 0838   AMPHETMU NONE DETECTED 04/04/2013 0838   THCU NONE DETECTED 04/04/2013 0838   LABBARB NONE DETECTED 04/04/2013 0838    Alcohol Level:  Recent Labs Lab 04/04/13 0539  ETH <11    Dg Chest 2 View  04/04/2013  CLINICAL DATA:  Stroke  EXAM: CHEST  2 VIEW  COMPARISON:  12/10/2011.  FINDINGS: Cardiomediastinal silhouette is unremarkable. No acute infiltrate or pleural effusion. No pulmonary edema. Bony thorax is unremarkable.  IMPRESSION: No active cardiopulmonary disease.   Electronically Signed   By: Natasha Mead M.D.   On: 04/04/2013 10:36   Ct Head Wo Contrast  04/04/2013   CLINICAL DATA:  Right facial droop.  EXAM: CT HEAD WITHOUT CONTRAST  TECHNIQUE: Contiguous axial images were obtained from the base of the skull through the vertex without intravenous contrast.  COMPARISON:  Head CT scan 02/15/2013.  FINDINGS: There is cortical atrophy and chronic microvascular ischemic change. No evidence of acute intracranial abnormality including infarct, hemorrhage, mass lesion, mass effect, midline shift or abnormal extra-axial fluid collection. No hydrocephalus or pneumocephalus. Calvarium intact. Imaged paranasal sinuses are clear. Very small right  mastoid effusion is noted, unchanged.  IMPRESSION: No acute abnormality.  Atrophy and chronic microvascular ischemic change. Stable compared to prior exam.  Critical Value/emergent results were called by telephone at the time of interpretation on 04/04/2013 at 5:28 AM to Dr. Dierdre Highman, who verbally acknowledged these results.   Electronically Signed   By: Drusilla Kanner M.D.   On: 04/04/2013 05:28   Mr Maxine Glenn Head Wo Contrast  04/04/2013   CLINICAL DATA:  63 year old male with hypertension and diabetes last seen normal at 2330 hr yesterday. Right facial droop and garbled speech. Initial encounter.  EXAM: MRI HEAD WITHOUT CONTRAST  MRA HEAD WITHOUT CONTRAST  TECHNIQUE: Multiplanar, multiecho pulse sequences of the brain and surrounding structures were obtained without intravenous contrast. Angiographic images of the head were obtained using MRA technique without contrast.  COMPARISON:  Head CT without contrast 04/04/2013.  FINDINGS: MRI HEAD FINDINGS  There are multiple bilateral foci of abnormal signal on the trace diffusion images. The areas which appear to be most restricted on ADC are in the posterior left MCA territory on series 5, image 22, and the right corpus callosum on image 23. The additional areas either demonstrate isointense ADC, or facilitated diffusion (T2 shine through).  Major intracranial vascular flow voids are preserved.  Extensive chronic small vessel ischemia throughout the brain. Chronic lacunar infarcts in the left thalamus, bilateral corona radiata (including associated chronic blood products e.g. left frontal horn), right paracentral pons, and scattered in both cerebellar hemispheres. Patchy and confluent additional cerebral white matter T2 and FLAIR hyperintensity. No supratentorial cortical encephalomalacia identified. Occasional small areas of chronic blood products in the cerebral hemispheres, and multiple chronic micro hemorrhages in the brainstem and cerebellum.  No midline shift, mass  effect, evidence of mass lesion, ventriculomegaly, extra-axial collection or acute intracranial hemorrhage. Cervicomedullary junction and pituitary are within normal limits. Negative visualized cervical spine. Visualized orbit soft tissues are within normal limits. Minor paranasal sinus mucosal thickening. Trace mastoid fluid and retained secretions in the nasopharynx. Visualized scalp soft tissues are within normal limits. Visualized bone marrow signal is within normal limits.  MRA HEAD FINDINGS  Antegrade flow in the posterior circulation with dominant distal left vertebral artery. Normal left PICA origin. Dominant right AICA. Patent vertebrobasilar junction. No basilar stenosis. SCA and PCA origins are normal. Normal left posterior communicating artery, the right is diminutive or absent. Mild irregularity in the bilateral PCA P2 segments with preserved distal flow.  Antegrade flow in both ICA siphons. Bilateral cavernous and supra clinoid segment irregularity, worse on the right where there is up to mild to moderate stenosis. Carotid termini remain patent. Ophthalmic and  left posterior communicating artery origins are normal.  MCA and ACA origins are patent. Motion artifact at this level degrades detail of the bilateral MCA and ACA vessels. The M1 segment on the right is within normal limits. There is irregularity of the left M1 segment (series 305, image 9) but this could be artifactual. No major MCA or ACA branch occlusion is evident.  IMPRESSION: 1. Multiple acute and subacute small vessel infarcts in the bilateral cerebral hemispheres. No associated mass effect or hemorrhage. Lesions involving the left MCA territory and right body of the corpus callosum appear to be have occurred most recently. 2. Underlying chronic severe small vessel ischemia. 3. Extensive anterior circulation atherosclerosis, most affecting the ICA siphons. Mild to moderate right ICA stenosis. 4. Motion artifact affecting detail of the  bilateral MCA and ACA branches, no major branch occlusion identified.   Electronically Signed   By: Augusto GambleLee  Hall M.D.   On: 04/04/2013 14:53   Mr Brain Wo Contrast  04/04/2013   CLINICAL DATA:  63 year old male with hypertension and diabetes last seen normal at 2330 hr yesterday. Right facial droop and garbled speech. Initial encounter.  EXAM: MRI HEAD WITHOUT CONTRAST  MRA HEAD WITHOUT CONTRAST  TECHNIQUE: Multiplanar, multiecho pulse sequences of the brain and surrounding structures were obtained without intravenous contrast. Angiographic images of the head were obtained using MRA technique without contrast.  COMPARISON:  Head CT without contrast 04/04/2013.  FINDINGS: MRI HEAD FINDINGS  There are multiple bilateral foci of abnormal signal on the trace diffusion images. The areas which appear to be most restricted on ADC are in the posterior left MCA territory on series 5, image 22, and the right corpus callosum on image 23. The additional areas either demonstrate isointense ADC, or facilitated diffusion (T2 shine through).  Major intracranial vascular flow voids are preserved.  Extensive chronic small vessel ischemia throughout the brain. Chronic lacunar infarcts in the left thalamus, bilateral corona radiata (including associated chronic blood products e.g. left frontal horn), right paracentral pons, and scattered in both cerebellar hemispheres. Patchy and confluent additional cerebral white matter T2 and FLAIR hyperintensity. No supratentorial cortical encephalomalacia identified. Occasional small areas of chronic blood products in the cerebral hemispheres, and multiple chronic micro hemorrhages in the brainstem and cerebellum.  No midline shift, mass effect, evidence of mass lesion, ventriculomegaly, extra-axial collection or acute intracranial hemorrhage. Cervicomedullary junction and pituitary are within normal limits. Negative visualized cervical spine. Visualized orbit soft tissues are within normal  limits. Minor paranasal sinus mucosal thickening. Trace mastoid fluid and retained secretions in the nasopharynx. Visualized scalp soft tissues are within normal limits. Visualized bone marrow signal is within normal limits.  MRA HEAD FINDINGS  Antegrade flow in the posterior circulation with dominant distal left vertebral artery. Normal left PICA origin. Dominant right AICA. Patent vertebrobasilar junction. No basilar stenosis. SCA and PCA origins are normal. Normal left posterior communicating artery, the right is diminutive or absent. Mild irregularity in the bilateral PCA P2 segments with preserved distal flow.  Antegrade flow in both ICA siphons. Bilateral cavernous and supra clinoid segment irregularity, worse on the right where there is up to mild to moderate stenosis. Carotid termini remain patent. Ophthalmic and left posterior communicating artery origins are normal.  MCA and ACA origins are patent. Motion artifact at this level degrades detail of the bilateral MCA and ACA vessels. The M1 segment on the right is within normal limits. There is irregularity of the left M1 segment (series 305, image 9) but this  could be artifactual. No major MCA or ACA branch occlusion is evident.  IMPRESSION: 1. Multiple acute and subacute small vessel infarcts in the bilateral cerebral hemispheres. No associated mass effect or hemorrhage. Lesions involving the left MCA territory and right body of the corpus callosum appear to be have occurred most recently. 2. Underlying chronic severe small vessel ischemia. 3. Extensive anterior circulation atherosclerosis, most affecting the ICA siphons. Mild to moderate right ICA stenosis. 4. Motion artifact affecting detail of the bilateral MCA and ACA branches, no major branch occlusion identified.   Electronically Signed   By: Augusto Gamble M.D.   On: 04/04/2013 14:53    CT of the brain No acute abnormality  MRI of the brain  Multiple acute and subacute small vessel infarcts in the   bilateral cerebral hemispheres. No associated mass effect or  hemorrhage. Lesions involving the left MCA territory and right body  of the corpus callosum appear to be have occurred most recently   MRA of the brain  Extensive anterior circulation atherosclerosis, most affecting  the ICA siphons. Mild to moderate right ICA stenosis   2D Echocardiogram    Carotid Doppler    CXR    EKG  Sinus rhythm   Therapy Recommendations pending  Physical Exam   Neurologic Examination:  Mental Status:  Alert, oriented, somewhat slow to respond. Speech dysarthric without evidence of aphasia. Able to follow commands without difficulty.  Cranial Nerves:  II-Visual fields were normal.  III/IV/VI-Pupils were equal and reacted. Extraocular movements were full and conjugate.  V/VII-no facial numbness; mild right lower facial weakness.  VIII-normal.  X-mild to moderate dysarthria.  XII-midline tongue extension  Motor: 5/5 bilaterally with normal tone and bulk  Sensory: Normal throughout.  Deep Tendon Reflexes: 2+ and symmetric.  Plantars: Flexor bilaterally  Cerebellar: Normal finger-to-nose testing  ASSESSMENT [name] is a 63 y.o. male with multiple risk factors for stroke presenting with R facial droop and dysarthria that have not improved. MRI showing b/l strokes with significant anterior circulation stenosis.    suspect strokes are embolic.    Embolic strokes  LDL 101  HTN  DM    Hospital day # 1  TREATMENT/PLAN  ASA 325  Tele to look for A-fib due to b/l embolic strokes  2d echo, if normal and tele does not show any signs of arhythmia, would strongly consider TEE early next week.    Pauletta Browns  04/05/2013 11:02 AM  I have personally obtained a history, examined the patient, evaluated imaging results, and formulated the assessment and plan of care. I agree with the above.    To contact Stroke Continuity provider, please refer to [email or web address]. After  hours, contact General Neurology

## 2013-04-05 NOTE — Progress Notes (Addendum)
[facility] PROGRESS NOTE  Manuel Higgins ZOX:[id number] DOB: [birth year] DOA: 04/04/2013 PCP: Bernerd Limbo  Assessment/Plan: 1-Multiple acute and subacute small vessel infarcts in the bilateral cerebral hemispheres;  Doppler; Findings suggest 1-39% internal carotid artery stenosis bilaterally. Vertebral arteries are patent with antegrade flow. ECHO; No source of thrombus.  Continue with aspirin. Plavix.  Need better diabetes controlled.  LDL 101. Start simvastatin.  Will probably need TEE.   2-Diabetes; start lantus. SSI. HB A-1c; 11.4.  3-left big toe ulcer; check x ray, blood culture. Wound care consult.   4-Acute renal failure Continue with IV fluids.   5-Dysphagia; dysphagia diet.  6-Hypertension; permissive hypertension. Follow trend. Will consider resume BP in am.   Code Status: Full Code.  Family Communication: Care discussed with patient.  Disposition Plan: Remain inpatient.    Consultants:  neurology  Procedures: ECHO;Left ventricle: The cavity size was normal. Wall thickness was increased in a pattern of moderate LVH. Systolic function was normal. The estimated ejection fraction was in the range of 60% to 65%. Wall motion was normal; there were no regional wall motion abnormalities. Doppler parameters are consistent with abnormal left ventricular relaxation (grade 1 diastolic dysfunction).   Doppler; Findings suggest 1-39% internal carotid artery stenosis bilaterally. Vertebral arteries are patent with antegrade flow.   Antibiotics:  none  HPI/Subjective: Patient denies chest pain, dyspnea.   Objective: Filed Vitals:   04/05/13 0913  BP: 138/60  Pulse: 84  Temp: 98.4 F (36.9 C)  Resp: 18    Intake/Output Summary (Last 24 hours) at 04/05/13 1039 Last data filed at 04/05/13 8119  Gross per 24 hour  Intake 1336.25 ml  Output    200 ml  Net 1136.25 ml   Filed Weights   04/04/13 0534 04/04/13 0922 04/05/13 0335  Weight: 78.019 kg (172  lb) 75.116 kg (165 lb 9.6 oz) 75.322 kg (166 lb 0.9 oz)    Exam:   General:  No distress.   Cardiovascular: S 1, S 2 RRR  Respiratory: CTA  Abdomen: Bs present, soft, nt  Musculoskeletal: trace edema, left big toe with dry ulceration, no drainage.   Neuro; speech disarthric, motor strength  5/5.   Data Reviewed: Basic Metabolic Panel:  Recent Labs Lab 04/04/13 0539 04/04/13 0551  NA 141 140  K 4.1 4.3  CL 100 102  CO2 25  --   GLUCOSE 174* 169*  BUN 21 28*  CREATININE 1.24 1.40*  CALCIUM 9.7  --    Liver Function Tests:  Recent Labs Lab 04/04/13 0539  AST 15  ALT 12  ALKPHOS 152*  BILITOT 0.2*  PROT 7.3  ALBUMIN 3.5   No results found for this basename: LIPASE, AMYLASE,  in the last 168 hours No results found for this basename: AMMONIA,  in the last 168 hours CBC:  Recent Labs Lab 04/04/13 0539 04/04/13 0551  WBC 7.9  --   NEUTROABS 5.5  --   HGB 13.3 13.9  HCT 37.5* 41.0  MCV 89.3  --   PLT 192  --    Cardiac Enzymes: No results found for this basename: CKTOTAL, CKMB, CKMBINDEX, TROPONINI,  in the last 168 hours BNP (last 3 results) No results found for this basename: PROBNP,  in the last 8760 hours CBG:  Recent Labs Lab 04/04/13 1705 04/04/13 2036 04/04/13 2344 04/05/13 0339 04/05/13 0736  GLUCAP 242* 276* 202* 170* 179*    No results found for this or any previous visit (from the past 240 hour(s)).  Studies: Dg Chest 2 View  04/04/2013   CLINICAL DATA:  Stroke  EXAM: CHEST  2 VIEW  COMPARISON:  12/10/2011.  FINDINGS: Cardiomediastinal silhouette is unremarkable. No acute infiltrate or pleural effusion. No pulmonary edema. Bony thorax is unremarkable.  IMPRESSION: No active cardiopulmonary disease.   Electronically Signed   By: Natasha Mead M.D.   On: 04/04/2013 10:36   Ct Head Wo Contrast  04/04/2013   CLINICAL DATA:  Right facial droop.  EXAM: CT HEAD WITHOUT CONTRAST  TECHNIQUE: Contiguous axial images were obtained from the base  of the skull through the vertex without intravenous contrast.  COMPARISON:  Head CT scan 02/15/2013.  FINDINGS: There is cortical atrophy and chronic microvascular ischemic change. No evidence of acute intracranial abnormality including infarct, hemorrhage, mass lesion, mass effect, midline shift or abnormal extra-axial fluid collection. No hydrocephalus or pneumocephalus. Calvarium intact. Imaged paranasal sinuses are clear. Very small right mastoid effusion is noted, unchanged.  IMPRESSION: No acute abnormality.  Atrophy and chronic microvascular ischemic change. Stable compared to prior exam.  Critical Value/emergent results were called by telephone at the time of interpretation on 04/04/2013 at 5:28 AM to Dr. Dierdre Highman, who verbally acknowledged these results.   Electronically Signed   By: Drusilla Kanner M.D.   On: 04/04/2013 05:28   Mr Maxine Glenn Head Wo Contrast  04/04/2013   CLINICAL DATA:  63 year old male with hypertension and diabetes last seen normal at 2330 hr yesterday. Right facial droop and garbled speech. Initial encounter.  EXAM: MRI HEAD WITHOUT CONTRAST  MRA HEAD WITHOUT CONTRAST  TECHNIQUE: Multiplanar, multiecho pulse sequences of the brain and surrounding structures were obtained without intravenous contrast. Angiographic images of the head were obtained using MRA technique without contrast.  COMPARISON:  Head CT without contrast 04/04/2013.  FINDINGS: MRI HEAD FINDINGS  There are multiple bilateral foci of abnormal signal on the trace diffusion images. The areas which appear to be most restricted on ADC are in the posterior left MCA territory on series 5, image 22, and the right corpus callosum on image 23. The additional areas either demonstrate isointense ADC, or facilitated diffusion (T2 shine through).  Major intracranial vascular flow voids are preserved.  Extensive chronic small vessel ischemia throughout the brain. Chronic lacunar infarcts in the left thalamus, bilateral corona radiata  (including associated chronic blood products e.g. left frontal horn), right paracentral pons, and scattered in both cerebellar hemispheres. Patchy and confluent additional cerebral white matter T2 and FLAIR hyperintensity. No supratentorial cortical encephalomalacia identified. Occasional small areas of chronic blood products in the cerebral hemispheres, and multiple chronic micro hemorrhages in the brainstem and cerebellum.  No midline shift, mass effect, evidence of mass lesion, ventriculomegaly, extra-axial collection or acute intracranial hemorrhage. Cervicomedullary junction and pituitary are within normal limits. Negative visualized cervical spine. Visualized orbit soft tissues are within normal limits. Minor paranasal sinus mucosal thickening. Trace mastoid fluid and retained secretions in the nasopharynx. Visualized scalp soft tissues are within normal limits. Visualized bone marrow signal is within normal limits.  MRA HEAD FINDINGS  Antegrade flow in the posterior circulation with dominant distal left vertebral artery. Normal left PICA origin. Dominant right AICA. Patent vertebrobasilar junction. No basilar stenosis. SCA and PCA origins are normal. Normal left posterior communicating artery, the right is diminutive or absent. Mild irregularity in the bilateral PCA P2 segments with preserved distal flow.  Antegrade flow in both ICA siphons. Bilateral cavernous and supra clinoid segment irregularity, worse on the right where there is up to mild  to moderate stenosis. Carotid termini remain patent. Ophthalmic and left posterior communicating artery origins are normal.  MCA and ACA origins are patent. Motion artifact at this level degrades detail of the bilateral MCA and ACA vessels. The M1 segment on the right is within normal limits. There is irregularity of the left M1 segment (series 305, image 9) but this could be artifactual. No major MCA or ACA branch occlusion is evident.  IMPRESSION: 1. Multiple acute  and subacute small vessel infarcts in the bilateral cerebral hemispheres. No associated mass effect or hemorrhage. Lesions involving the left MCA territory and right body of the corpus callosum appear to be have occurred most recently. 2. Underlying chronic severe small vessel ischemia. 3. Extensive anterior circulation atherosclerosis, most affecting the ICA siphons. Mild to moderate right ICA stenosis. 4. Motion artifact affecting detail of the bilateral MCA and ACA branches, no major branch occlusion identified.   Electronically Signed   By: Augusto Gamble M.D.   On: 04/04/2013 14:53   Mr Brain Wo Contrast  04/04/2013   CLINICAL DATA:  63 year old male with hypertension and diabetes last seen normal at 2330 hr yesterday. Right facial droop and garbled speech. Initial encounter.  EXAM: MRI HEAD WITHOUT CONTRAST  MRA HEAD WITHOUT CONTRAST  TECHNIQUE: Multiplanar, multiecho pulse sequences of the brain and surrounding structures were obtained without intravenous contrast. Angiographic images of the head were obtained using MRA technique without contrast.  COMPARISON:  Head CT without contrast 04/04/2013.  FINDINGS: MRI HEAD FINDINGS  There are multiple bilateral foci of abnormal signal on the trace diffusion images. The areas which appear to be most restricted on ADC are in the posterior left MCA territory on series 5, image 22, and the right corpus callosum on image 23. The additional areas either demonstrate isointense ADC, or facilitated diffusion (T2 shine through).  Major intracranial vascular flow voids are preserved.  Extensive chronic small vessel ischemia throughout the brain. Chronic lacunar infarcts in the left thalamus, bilateral corona radiata (including associated chronic blood products e.g. left frontal horn), right paracentral pons, and scattered in both cerebellar hemispheres. Patchy and confluent additional cerebral white matter T2 and FLAIR hyperintensity. No supratentorial cortical encephalomalacia  identified. Occasional small areas of chronic blood products in the cerebral hemispheres, and multiple chronic micro hemorrhages in the brainstem and cerebellum.  No midline shift, mass effect, evidence of mass lesion, ventriculomegaly, extra-axial collection or acute intracranial hemorrhage. Cervicomedullary junction and pituitary are within normal limits. Negative visualized cervical spine. Visualized orbit soft tissues are within normal limits. Minor paranasal sinus mucosal thickening. Trace mastoid fluid and retained secretions in the nasopharynx. Visualized scalp soft tissues are within normal limits. Visualized bone marrow signal is within normal limits.  MRA HEAD FINDINGS  Antegrade flow in the posterior circulation with dominant distal left vertebral artery. Normal left PICA origin. Dominant right AICA. Patent vertebrobasilar junction. No basilar stenosis. SCA and PCA origins are normal. Normal left posterior communicating artery, the right is diminutive or absent. Mild irregularity in the bilateral PCA P2 segments with preserved distal flow.  Antegrade flow in both ICA siphons. Bilateral cavernous and supra clinoid segment irregularity, worse on the right where there is up to mild to moderate stenosis. Carotid termini remain patent. Ophthalmic and left posterior communicating artery origins are normal.  MCA and ACA origins are patent. Motion artifact at this level degrades detail of the bilateral MCA and ACA vessels. The M1 segment on the right is within normal limits. There is irregularity of the  left M1 segment (series 305, image 9) but this could be artifactual. No major MCA or ACA branch occlusion is evident.  IMPRESSION: 1. Multiple acute and subacute small vessel infarcts in the bilateral cerebral hemispheres. No associated mass effect or hemorrhage. Lesions involving the left MCA territory and right body of the corpus callosum appear to be have occurred most recently. 2. Underlying chronic severe  small vessel ischemia. 3. Extensive anterior circulation atherosclerosis, most affecting the ICA siphons. Mild to moderate right ICA stenosis. 4. Motion artifact affecting detail of the bilateral MCA and ACA branches, no major branch occlusion identified.   Electronically Signed   By: Augusto GambleLee  Hall M.D.   On: 04/04/2013 14:53    Scheduled Meds: . aspirin  324 mg Oral Once  . aspirin  300 mg Rectal Daily   Or  . aspirin  325 mg Oral Daily  . insulin aspart  0-9 Units Subcutaneous 6 times per day  . research study medication  75 mg Oral Q breakfast   Continuous Infusions: . sodium chloride 75 mL/hr at 04/05/13 [id number]    Principal Problem:   CVA (cerebral infarction) Active Problems:   Diabetes   Acute encephalopathy   Acute renal failure   Dysphagia    Time spent: 25 minutes.     Hartley Barefootegalado, Serenitee Fuertes A  [facility] Pager [phone]. If 7PM-7AM, please contact night-coverage at [email or web address], password [email or web address] 04/05/2013, 10:39 AM  LOS: 1 day

## 2013-04-05 NOTE — Evaluation (Signed)
Occupational Therapy Evaluation Patient Details Name: Manuel Higgins: [id number] DOB: 10/01/1950 Today's Date: 04/05/2013 Time: 1207-1226 OT Time Calculation (min): 19 min  OT Assessment / Plan / Recommendation History of present illness Pt admitted with slurred speech and [facility] MRI revealed multiple B acute and subacute small vessel infarcts.   Clinical Impression   Pt presents with balance deficits and general lack of awareness of deficits and safety.  He is a poor historian and tends to respond to questions about his current living environment, level of assist at the group home, and his responsibilities there vaguely and with brief answers. IPt's baseline is unclear. Will follow acutely to further assess cognition and to address ADL in preparation [facility]   OT Assessment  Patient needs continued OT Services    Follow Up Recommendations  Home health OT;Supervision/Assistance - 24 hour    Barriers to Discharge      Equipment Recommendations  None recommended by OT    Recommendations for Other Services    Frequency  Min 2X/week    Precautions / Restrictions Precautions Precautions: Fall Restrictions Weight Bearing Restrictions: No   Pertinent Vitals/Pain No pain, VSS.    ADL  Eating/Feeding: Set up Where Assessed - Eating/Feeding: Bed level Grooming: Wash/dry hands;Supervision/safety Where Assessed - Grooming: Unsupported standing Upper Body Bathing: Supervision/safety Where Assessed - Upper Body Bathing: Unsupported sitting Lower Body Bathing: Min guard Where Assessed - Lower Body Bathing: Supported standing Upper Body Dressing: Set up Where Assessed - Upper Body Dressing: Unsupported sitting Lower Body Dressing: Supervision/safety Where Assessed - Lower Body Dressing: Unsupported sitting;Supported sit to stand Toilet Transfer: Min Pension scheme managerguard Toilet Transfer Method: Sit to Barist[place]and Toilet Transfer Equipment:  Regular height toilet;Grab bars Toileting - ArchitectClothing Manipulation and Hygiene: Min guard Where Assessed - Engineer, miningToileting Clothing Manipulation and Hygiene: Standing Equipment Used: Gait belt Transfers/Ambulation Related to ADLs: min guard assist, reaches for furniture, unsteady ADL Comments: Pt able to cross his foot over his opposite knee and donn and doff his socks.  Pt without awareness of having stool all over his back side, able to clean it himself with min guard assist for safety in standing.    OT Diagnosis: Generalized weakness;Cognitive deficits  OT Problem List: Impaired balance (sitting and/or standing);Decreased cognition;Decreased knowledge of use of DME or AE OT Treatment Interventions: Self-care/ADL training;DME and/or AE instruction;Therapeutic activities;Cognitive remediation/compensation;Patient/family education;Balance training   OT Goals(Current goals can be found in the care plan section) Acute Rehab OT Goals Patient Stated Goal: did not state OT Goal Formulation: With patient Time For Goal Achievement: 04/12/13 Potential to Achieve Goals: Good ADL Goals Pt Will Perform Grooming: with modified independence;standing Pt Will Perform Lower Body Bathing: with modified independence;sit to/from stand Pt Will Perform Lower Body Dressing: with modified independence;sit to/from stand Pt Will Transfer to Toilet: with modified independence;ambulating;regular height toilet Pt Will Perform Toileting - Clothing Manipulation and hygiene: with modified independence;sit to/from stand Pt Will Perform Tub/Shower Transfer: with modified independence;ambulating;grab bars  Visit Information  Last OT Received On: 04/05/13 Assistance Needed: +1 History of Present Illness: Pt admitted with [facility] MRI revealed multiple B acute and subacute small vessel infarcts.       Prior Functioning     Home Living Family/patient expects to be  discharged to:: Private residence Living Arrangements: Other (Comment) ([name]) Home Equipment: None Prior Function Level of Independence: Independent Comments: Pt could perform self  care.  He is required to make up his bed and keep his personal area clean, but not to do cooking or cleaning otherwise. Communication Communication: Expressive difficulties (some slurring, responds to questions with short answers) Dominant Hand: Right         Vision/Perception Vision - History Baseline Vision: Wears glasses only for reading Patient Visual Report: No change from baseline Vision - Assessment Additional Comments: visual fields and ocular movements appear intact, pt with difficulty complying with requirements of testing.   Cognition  Cognition Arousal/Alertness: Awake/alert Behavior During Therapy: Flat affect Overall Cognitive Status: No family/caregiver present to determine baseline cognitive functioning (pt with decreased awareness of deficits, poor historian)    Extremity/Trunk Assessment Upper Extremity Assessment Upper Extremity Assessment: Overall WFL for tasks assessed Lower Extremity Assessment Lower Extremity Assessment: Defer to PT evaluation Cervical / Trunk Assessment Cervical / Trunk Assessment: Normal     Mobility Bed Mobility Overal bed mobility: Modified Independent Transfers Overall transfer level: Needs assistance Transfers: Sit to/from Stand Sit to Stand: Supervision     Exercise     Balance     End of Session OT - End of Session Activity Tolerance: Patient tolerated treatment well Patient left: in bed;with call bell/phone within reach;with bed alarm set  GO     Evern Bio 04/05/2013, 1:58 PM [phone]

## 2013-04-06 LAB — CBC
HCT: 34.3 % — ABNORMAL LOW (ref 39.0–52.0)
HEMOGLOBIN: 11.6 g/dL — AB (ref 13.0–17.0)
MCH: 30.7 pg (ref 26.0–34.0)
MCHC: 33.8 g/dL (ref 30.0–36.0)
MCV: 90.7 fL (ref 78.0–100.0)
Platelets: 203 10*3/uL (ref 150–400)
RBC: 3.78 MIL/uL — ABNORMAL LOW (ref 4.22–5.81)
RDW: 12.1 % (ref 11.5–15.5)
WBC: 5.9 10*3/uL (ref 4.0–10.5)

## 2013-04-06 LAB — GLUCOSE, CAPILLARY
GLUCOSE-CAPILLARY: 143 mg/dL — AB (ref 70–99)
GLUCOSE-CAPILLARY: 187 mg/dL — AB (ref 70–99)
GLUCOSE-CAPILLARY: 243 mg/dL — AB (ref 70–99)
Glucose-Capillary: 207 mg/dL — ABNORMAL HIGH (ref 70–99)
Glucose-Capillary: 237 mg/dL — ABNORMAL HIGH (ref 70–99)
Glucose-Capillary: 255 mg/dL — ABNORMAL HIGH (ref 70–99)

## 2013-04-06 LAB — BASIC METABOLIC PANEL
BUN: 14 mg/dL (ref 6–23)
CALCIUM: 9 mg/dL (ref 8.4–10.5)
CO2: 24 mEq/L (ref 19–32)
Chloride: 103 mEq/L (ref 96–112)
Creatinine, Ser: 0.94 mg/dL (ref 0.50–1.35)
GFR, EST NON AFRICAN AMERICAN: 88 mL/min — AB (ref >90)
GLUCOSE: 226 mg/dL — AB (ref 70–99)
POTASSIUM: 4.1 meq/L (ref 3.7–5.3)
SODIUM: 138 meq/L (ref 137–147)

## 2013-04-06 LAB — SEDIMENTATION RATE: Sed Rate: 40 mm/hr — ABNORMAL HIGH (ref 0–16)

## 2013-04-06 MED ORDER — SODIUM CHLORIDE 0.9 % IV SOLN
INTRAVENOUS | Status: DC
  Administered 2013-04-06: 20:00:00 via INTRAVENOUS
  Administered 2013-04-07: 500 mL via INTRAVENOUS

## 2013-04-06 MED ORDER — STROKE: EARLY STAGES OF RECOVERY BOOK
Freq: Once | Status: AC
  Administered 2013-04-06: 16:00:00
  Filled 2013-04-06: qty 1

## 2013-04-06 MED ORDER — SODIUM CHLORIDE 0.9 % IV SOLN
INTRAVENOUS | Status: DC
  Administered 2013-04-06: 13:00:00 via INTRAVENOUS

## 2013-04-06 MED ORDER — ENOXAPARIN SODIUM 40 MG/0.4ML ~~LOC~~ SOLN: 40.0000 mg | SUBCUTANEOUS | Status: DC

## 2013-04-06 NOTE — Progress Notes (Signed)
Stroke Team Progress Note  HISTORY  63 y.o. male with a history of hypertension and diabetes mellitus presenting with new onset slurred speech and right facial droop. No history of stroke nor TIA. Patient had no extremity weakness. CT scan of the head showed no acute intracranial abnormality. NIH stroke score was 2.    SUBJECTIVE States speech still is still not baseline as well as R facial droop but has improved.   OBJECTIVE Most recent Vital Signs: Filed Vitals:   04/05/13 2000 04/06/13 0000 04/06/13 0400 04/06/13 0809  BP: 155/74 159/56 177/87 157/83  Pulse: 86 80 71 68  Temp: 99.4 F (37.4 C) 98.5 F (36.9 C) 98.5 F (36.9 C) 98 F (36.7 C)  TempSrc: Oral Oral Oral Oral  Resp: 16 16 16 16   Height:      Weight:      SpO2: 98% 99% 98% 99%   CBG (last 3)   Recent Labs  04/06/13 0033 04/06/13 0440 04/06/13 0745  GLUCAP 243* 207* 143*    IV Fluid Intake:   . sodium chloride 75 mL/hr at 04/05/13 1920    MEDICATIONS  . aspirin  324 mg Oral Once  . aspirin  300 mg Rectal Daily   Or  . aspirin  325 mg Oral Daily  . insulin aspart  0-9 Units Subcutaneous 6 times per day  . insulin glargine  15 Units Subcutaneous QHS  . research study medication  75 mg Oral Q breakfast  . simvastatin  10 mg Oral q1800   PRN:  acetaminophen, acetaminophen, hydrALAZINE  Diet:  Dysphagia Activity:   Up with assistance DVT Prophylaxis:  SCDs  CLINICALLY SIGNIFICANT STUDIES Basic Metabolic Panel:   Recent Labs Lab 04/04/13 0539 04/04/13 0551 04/06/13 0342  NA 141 140 138  K 4.1 4.3 4.1  CL 100 102 103  CO2 25  --  24  GLUCOSE 174* 169* 226*  BUN 21 28* 14  CREATININE 1.24 1.40* 0.94  CALCIUM 9.7  --  9.0   Liver Function Tests:   Recent Labs Lab 04/04/13 0539  AST 15  ALT 12  ALKPHOS 152*  BILITOT 0.2*  PROT 7.3  ALBUMIN 3.5   CBC:   Recent Labs Lab 04/04/13 0539 04/04/13 0551 04/06/13 0342  WBC 7.9  --  5.9  NEUTROABS 5.5  --   --   HGB 13.3 13.9 11.6*   HCT 37.5* 41.0 34.3*  MCV 89.3  --  90.7  PLT 192  --  203   Coagulation:   Recent Labs Lab 04/04/13 0539  LABPROT 13.2  INR 1.02   Cardiac Enzymes: No results found for this basename: CKTOTAL, CKMB, CKMBINDEX, TROPONINI,  in the last 168 hours Urinalysis:   Recent Labs Lab 04/04/13 0838  COLORURINE YELLOW  LABSPEC 1.030  PHURINE 6.0  GLUCOSEU >1000*  HGBUR NEGATIVE  BILIRUBINUR NEGATIVE  KETONESUR NEGATIVE  PROTEINUR 30*  UROBILINOGEN 0.2  NITRITE NEGATIVE  LEUKOCYTESUR NEGATIVE   Lipid Panel    Component Value Date/Time   CHOL 173 04/05/2013 0309   TRIG 113 04/05/2013 0309   HDL 49 04/05/2013 0309   CHOLHDL 3.5 04/05/2013 0309   VLDL 23 04/05/2013 0309   LDLCALC 101* 04/05/2013 0309   HgbA1C  Lab Results  Component Value Date   HGBA1C 11.4* 04/05/2013    Urine Drug Screen:     Component Value Date/Time   LABOPIA NONE DETECTED 04/04/2013 0838   COCAINSCRNUR NONE DETECTED 04/04/2013 0838   LABBENZ NONE DETECTED 04/04/2013  [id number]   AMPHETMU NONE DETECTED 04/04/2013 0838   THCU NONE DETECTED 04/04/2013 0838   LABBARB NONE DETECTED 04/04/2013 0838    Alcohol Level:   Recent Labs Lab 04/04/13 0539  ETH <11    Dg Chest 2 View  04/04/2013   CLINICAL DATA:  Stroke  EXAM: CHEST  2 VIEW  COMPARISON:  12/10/2011.  FINDINGS: Cardiomediastinal silhouette is unremarkable. No acute infiltrate or pleural effusion. No pulmonary edema. Bony thorax is unremarkable.  IMPRESSION: No active cardiopulmonary disease.   Electronically Signed   By: Natasha MeadLiviu  Pop M.D.   On: 04/04/2013 10:36   Mr Shirlee LatchMra Head Wo Contrast  04/04/2013   CLINICAL DATA:  63 year old male with hypertension and diabetes last seen normal at 2330 hr yesterday. Right facial droop and garbled speech. Initial encounter.  EXAM: MRI HEAD WITHOUT CONTRAST  MRA HEAD WITHOUT CONTRAST  TECHNIQUE: Multiplanar, multiecho pulse sequences of the brain and surrounding structures were obtained without intravenous contrast. Angiographic  images of the head were obtained using MRA technique without contrast.  COMPARISON:  Head CT without contrast 04/04/2013.  FINDINGS: MRI HEAD FINDINGS  There are multiple bilateral foci of abnormal signal on the trace diffusion images. The areas which appear to be most restricted on ADC are in the posterior left MCA territory on series 5, image 22, and the right corpus callosum on image 23. The additional areas either demonstrate isointense ADC, or facilitated diffusion (T2 shine through).  Major intracranial vascular flow voids are preserved.  Extensive chronic small vessel ischemia throughout the brain. Chronic lacunar infarcts in the left thalamus, bilateral corona radiata (including associated chronic blood products e.g. left frontal horn), right paracentral pons, and scattered in both cerebellar hemispheres. Patchy and confluent additional cerebral white matter T2 and FLAIR hyperintensity. No supratentorial cortical encephalomalacia identified. Occasional small areas of chronic blood products in the cerebral hemispheres, and multiple chronic micro hemorrhages in the brainstem and cerebellum.  No midline shift, mass effect, evidence of mass lesion, ventriculomegaly, extra-axial collection or acute intracranial hemorrhage. Cervicomedullary junction and pituitary are within normal limits. Negative visualized cervical spine. Visualized orbit soft tissues are within normal limits. Minor paranasal sinus mucosal thickening. Trace mastoid fluid and retained secretions in the nasopharynx. Visualized scalp soft tissues are within normal limits. Visualized bone marrow signal is within normal limits.  MRA HEAD FINDINGS  Antegrade flow in the posterior circulation with dominant distal left vertebral artery. Normal left PICA origin. Dominant right AICA. Patent vertebrobasilar junction. No basilar stenosis. SCA and PCA origins are normal. Normal left posterior communicating artery, the right is diminutive or absent. Mild  irregularity in the bilateral PCA P2 segments with preserved distal flow.  Antegrade flow in both ICA siphons. Bilateral cavernous and supra clinoid segment irregularity, worse on the right where there is up to mild to moderate stenosis. Carotid termini remain patent. Ophthalmic and left posterior communicating artery origins are normal.  MCA and ACA origins are patent. Motion artifact at this level degrades detail of the bilateral MCA and ACA vessels. The M1 segment on the right is within normal limits. There is irregularity of the left M1 segment (series 305, image 9) but this could be artifactual. No major MCA or ACA branch occlusion is evident.  IMPRESSION: 1. Multiple acute and subacute small vessel infarcts in the bilateral cerebral hemispheres. No associated mass effect or hemorrhage. Lesions involving the left MCA territory and right body of the corpus callosum appear to be have occurred most recently. 2. Underlying chronic severe  small vessel ischemia. 3. Extensive anterior circulation atherosclerosis, most affecting the ICA siphons. Mild to moderate right ICA stenosis. 4. Motion artifact affecting detail of the bilateral MCA and ACA branches, no major branch occlusion identified.   Electronically Signed   By: Augusto Gamble M.D.   On: 04/04/2013 14:53   Mr Brain Wo Contrast  04/04/2013   CLINICAL DATA:  63 year old male with hypertension and diabetes last seen normal at 2330 hr yesterday. Right facial droop and garbled speech. Initial encounter.  EXAM: MRI HEAD WITHOUT CONTRAST  MRA HEAD WITHOUT CONTRAST  TECHNIQUE: Multiplanar, multiecho pulse sequences of the brain and surrounding structures were obtained without intravenous contrast. Angiographic images of the head were obtained using MRA technique without contrast.  COMPARISON:  Head CT without contrast 04/04/2013.  FINDINGS: MRI HEAD FINDINGS  There are multiple bilateral foci of abnormal signal on the trace diffusion images. The areas which appear to  be most restricted on ADC are in the posterior left MCA territory on series 5, image 22, and the right corpus callosum on image 23. The additional areas either demonstrate isointense ADC, or facilitated diffusion (T2 shine through).  Major intracranial vascular flow voids are preserved.  Extensive chronic small vessel ischemia throughout the brain. Chronic lacunar infarcts in the left thalamus, bilateral corona radiata (including associated chronic blood products e.g. left frontal horn), right paracentral pons, and scattered in both cerebellar hemispheres. Patchy and confluent additional cerebral white matter T2 and FLAIR hyperintensity. No supratentorial cortical encephalomalacia identified. Occasional small areas of chronic blood products in the cerebral hemispheres, and multiple chronic micro hemorrhages in the brainstem and cerebellum.  No midline shift, mass effect, evidence of mass lesion, ventriculomegaly, extra-axial collection or acute intracranial hemorrhage. Cervicomedullary junction and pituitary are within normal limits. Negative visualized cervical spine. Visualized orbit soft tissues are within normal limits. Minor paranasal sinus mucosal thickening. Trace mastoid fluid and retained secretions in the nasopharynx. Visualized scalp soft tissues are within normal limits. Visualized bone marrow signal is within normal limits.  MRA HEAD FINDINGS  Antegrade flow in the posterior circulation with dominant distal left vertebral artery. Normal left PICA origin. Dominant right AICA. Patent vertebrobasilar junction. No basilar stenosis. SCA and PCA origins are normal. Normal left posterior communicating artery, the right is diminutive or absent. Mild irregularity in the bilateral PCA P2 segments with preserved distal flow.  Antegrade flow in both ICA siphons. Bilateral cavernous and supra clinoid segment irregularity, worse on the right where there is up to mild to moderate stenosis. Carotid termini remain  patent. Ophthalmic and left posterior communicating artery origins are normal.  MCA and ACA origins are patent. Motion artifact at this level degrades detail of the bilateral MCA and ACA vessels. The M1 segment on the right is within normal limits. There is irregularity of the left M1 segment (series 305, image 9) but this could be artifactual. No major MCA or ACA branch occlusion is evident.  IMPRESSION: 1. Multiple acute and subacute small vessel infarcts in the bilateral cerebral hemispheres. No associated mass effect or hemorrhage. Lesions involving the left MCA territory and right body of the corpus callosum appear to be have occurred most recently. 2. Underlying chronic severe small vessel ischemia. 3. Extensive anterior circulation atherosclerosis, most affecting the ICA siphons. Mild to moderate right ICA stenosis. 4. Motion artifact affecting detail of the bilateral MCA and ACA branches, no major branch occlusion identified.   Electronically Signed   By: Augusto Gamble M.D.   On: 04/04/2013 14:53  Dg Foot Complete Left  04/05/2013   CLINICAL DATA:  Ulceration of the left great toe  EXAM: LEFT FOOT - COMPLETE 3+ VIEW  COMPARISON:  DG TOES 2+V*L* dated 10/07/2012  FINDINGS: There is no acute fracture or dislocation. There is is soft tissue ulcer along medial aspect of the left great toe. There is erosion of the distal tuft of the first distal phalanx concerning for osteomyelitis. There is no soft tissue emphysema. There are mild osteoarthritic changes of the first MTP joint. There is no acute fracture or dislocation.  IMPRESSION: Soft tissue ulceration along the medial aspect of the left great toe with erosion of the distal tuft of the first distal phalanx concerning for osteomyelitis. There is no soft tissue emphysema.   Electronically Signed   By: Elige Ko   On: 04/05/2013 17:31    CT of the brain No acute abnormality  MRI of the brain  Multiple acute and subacute small vessel infarcts in the   bilateral cerebral hemispheres. No associated mass effect or  hemorrhage. Lesions involving the left MCA territory and right body  of the corpus callosum appear to be have occurred most recently   MRA of the brain  Extensive anterior circulation atherosclerosis, most affecting  the ICA siphons. Mild to moderate right ICA stenosis   2D Echocardiogram  Left ventricle: The cavity size was normal. Wall thickness was increased in a pattern of moderate LVH. Systolic function was normal. The estimated ejection fraction was in the range of 60% to 65%. Wall motion was normal; there were no regional wall motion abnormalities. Doppler parameters are consistent with abnormal left ventricular relaxation (grade 1 diastolic dysfunction).    Carotid Doppler  No hemodynamic stenosis  CXR    EKG  Sinus rhythm   Therapy Recommendations pending  Physical Exam   Neurologic Examination:  Mental Status:  Alert, oriented, somewhat slow to respond. Speech dysarthric without evidence of aphasia. Able to follow commands without difficulty.  Cranial Nerves:  II-Visual fields were normal.  III/IV/VI-Pupils were equal and reacted. Extraocular movements were full and conjugate.  V/VII-no facial numbness; mild right lower facial weakness.  VIII-normal.  X-mild to moderate dysarthria.  XII-midline tongue extension  Motor: 5/5 bilaterally with normal tone and bulk  Sensory: Normal throughout.  Deep Tendon Reflexes: 2+ and symmetric.  Plantars: Flexor bilaterally  Cerebellar: Normal finger-to-nose testing  ASSESSMENT [name] is a 63 y.o. male with multiple risk factors for stroke presenting with R facial droop and dysarthria that have not improved. MRI showing b/l strokes with significant anterior circulation stenosis.  Pt's speech has improved as well as teh facial droop  suspect strokes are embolic.    Embolic strokes  LDL 101  HTN  DM    Hospital day #  2  TREATMENT/PLAN  ASA 325  Tele to look for A-fib due to b/l embolic strokes  Pt/ot/speech  2d echo, if normal and tele does not show any signs of arhythmia, would strongly consider TEE early next week.    Pauletta Browns  04/06/2013 10:01 AM  I have personally obtained a history, examined the patient, evaluated imaging results, and formulated the assessment and plan of care. I agree with the above.    To contact Stroke Continuity provider, please refer to [email or web address]. After hours, contact General Neurology

## 2013-04-06 NOTE — Progress Notes (Signed)
[facility] PROGRESS NOTE  Etheridge Geil YHC:[id number] DOB: [birth year] DOA: 04/04/2013 PCP: [name]  Assessment/Plan: 1-Multiple acute and subacute small vessel infarcts in the bilateral cerebral hemispheres;  Doppler; Findings suggest 1-39% internal carotid artery stenosis bilaterally. Vertebral arteries are patent with antegrade flow. ECHO; No source of thrombus.  Continue with aspirin.  Need better diabetes controlled.  LDL 101. Started simvastatin.  I have consulted Cardio for  TEE. Will make patient NPO after midnight.   2-left big toe ulcer;  x ray: Soft tissue ulceration along the medial aspect of the left great toe with erosion of the distal tuft of the first distal phalanx  concerning for osteomyelitis. There is no soft tissue emphysema. - Blood culture no growth to date.  -Wound care consult.  -Dr Sharol Given to see patient tomorrow.  -Will check MRI. ESR.   3-Diabetes; Continue with lantus. SSI. HB A-1c; 11.4.  4-Acute renal failure resolved with IV fluids. Cr peak to 1.4  5-Dysphagia; dysphagia diet.  6-Hypertension; permissive hypertension. Follow trend.  SBP in the 100 range,. Continue to hold BP medications.   Code Status: Full Code.  Family Communication: Care discussed with patient.  Disposition Plan: Remain inpatient.    Consultants:  neurology  Procedures: ECHO;Left ventricle: The cavity size was normal. Wall thickness was increased in a pattern of moderate LVH. Systolic function was normal. The estimated ejection fraction was in the range of 60% to 65%. Wall motion was normal; there were no regional wall motion abnormalities. Doppler parameters are consistent with abnormal left ventricular relaxation (grade 1 diastolic dysfunction).   Doppler; Findings suggest 1-39% internal carotid artery stenosis bilaterally. Vertebral arteries are patent with antegrade flow.   Antibiotics:  none  HPI/Subjective: Patient denies chest pain, dyspnea.    Objective: Filed Vitals:   04/06/13 0809  BP: 157/83  Pulse: 68  Temp: 98 F (36.7 C)  Resp: 16    Intake/Output Summary (Last 24 hours) at 04/06/13 1210 Last data filed at 04/06/13 0858  Gross per 24 hour  Intake    240 ml  Output   1700 ml  Net  -1460 ml   Filed Weights   04/04/13 0534 04/04/13 0922 04/05/13 0335  Weight: 78.019 kg (172 lb) 75.116 kg (165 lb 9.6 oz) 75.322 kg (166 lb 0.9 oz)    Exam:   General:  No distress.   Cardiovascular: S 1, S 2 RRR  Respiratory: CTA  Abdomen: Bs present, soft, nt  Musculoskeletal: trace edema, left big toe with dry ulceration, no drainage.   Neuro; speech less disarthric, motor strength  5/5. Right facial droop  Data Reviewed: Basic Metabolic Panel:  Recent Labs Lab 04/04/13 0539 04/04/13 0551 04/06/13 0342  NA 141 140 138  K 4.1 4.3 4.1  CL 100 102 103  CO2 25  --  24  GLUCOSE 174* 169* 226*  BUN 21 28* 14  CREATININE 1.24 1.40* 0.94  CALCIUM 9.7  --  9.0   Liver Function Tests:  Recent Labs Lab 04/04/13 0539  AST 15  ALT 12  ALKPHOS 152*  BILITOT 0.2*  PROT 7.3  ALBUMIN 3.5   No results found for this basename: LIPASE, AMYLASE,  in the last 168 hours No results found for this basename: AMMONIA,  in the last 168 hours CBC:  Recent Labs Lab 04/04/13 0539 04/04/13 0551 04/06/13 0342  WBC 7.9  --  5.9  NEUTROABS 5.5  --   --   HGB 13.3 13.9 11.6*  HCT 37.5*  41.0 34.3*  MCV 89.3  --  90.7  PLT 192  --  203   Cardiac Enzymes: No results found for this basename: CKTOTAL, CKMB, CKMBINDEX, TROPONINI,  in the last 168 hours BNP (last 3 results) No results found for this basename: PROBNP,  in the last 8760 hours CBG:  Recent Labs Lab 04/05/13 2010 04/06/13 0033 04/06/13 0440 04/06/13 0745 04/06/13 1130  GLUCAP 429* 243* 207* 143* 187*    Recent Results (from the past 240 hour(s))  CULTURE, BLOOD (ROUTINE X 2)     Status: None   Collection Time    04/05/13  2:47 PM      Result  Value Ref Range Status   Specimen Description BLOOD RIGHT ARM   Final   Special Requests BOTTLES DRAWN AEROBIC AND ANAEROBIC 10 CC   Final   Culture  Setup Time     Final   Value: 04/05/2013 20:19     Performed at [facility]   Culture     Final   Value:        BLOOD CULTURE RECEIVED NO GROWTH TO DATE CULTURE WILL BE HELD FOR 5 DAYS BEFORE ISSUING A FINAL NEGATIVE REPORT     Performed at [facility]   Report Status PENDING   Incomplete  CULTURE, BLOOD (ROUTINE X 2)     Status: None   Collection Time    04/05/13  3:01 PM      Result Value Ref Range Status   Specimen Description BLOOD RIGHT HAND   Final   Special Requests BOTTLES DRAWN AEROBIC ONLY 10 CC   Final   Culture  Setup Time     Final   Value: 04/05/2013 20:18     Performed at [facility]   Culture     Final   Value:        BLOOD CULTURE RECEIVED NO GROWTH TO DATE CULTURE WILL BE HELD FOR 5 DAYS BEFORE ISSUING A FINAL NEGATIVE REPORT     Performed at [facility]   Report Status PENDING   Incomplete     Studies: Mr Virgel Paling Wo Contrast  04/04/2013   CLINICAL DATA:  63 year old male with hypertension and diabetes last seen normal at 2330 hr yesterday. Right facial droop and garbled speech. Initial encounter.  EXAM: MRI HEAD WITHOUT CONTRAST  MRA HEAD WITHOUT CONTRAST  TECHNIQUE: Multiplanar, multiecho pulse sequences of the brain and surrounding structures were obtained without intravenous contrast. Angiographic images of the head were obtained using MRA technique without contrast.  COMPARISON:  Head CT without contrast 04/04/2013.  FINDINGS: MRI HEAD FINDINGS  There are multiple bilateral foci of abnormal signal on the trace diffusion images. The areas which appear to be most restricted on ADC are in the posterior left MCA territory on series 5, image 22, and the right corpus callosum on image 23. The additional areas either demonstrate isointense ADC, or facilitated diffusion (T2 shine through).   Major intracranial vascular flow voids are preserved.  Extensive chronic small vessel ischemia throughout the brain. Chronic lacunar infarcts in the left thalamus, bilateral corona radiata (including associated chronic blood products e.g. left frontal horn), right paracentral pons, and scattered in both cerebellar hemispheres. Patchy and confluent additional cerebral white matter T2 and FLAIR hyperintensity. No supratentorial cortical encephalomalacia identified. Occasional small areas of chronic blood products in the cerebral hemispheres, and multiple chronic micro hemorrhages in the brainstem and cerebellum.  No midline shift, mass effect, evidence of mass lesion,  ventriculomegaly, extra-axial collection or acute intracranial hemorrhage. Cervicomedullary junction and pituitary are within normal limits. Negative visualized cervical spine. Visualized orbit soft tissues are within normal limits. Minor paranasal sinus mucosal thickening. Trace mastoid fluid and retained secretions in the nasopharynx. Visualized scalp soft tissues are within normal limits. Visualized bone marrow signal is within normal limits.  MRA HEAD FINDINGS  Antegrade flow in the posterior circulation with dominant distal left vertebral artery. Normal left PICA origin. Dominant right AICA. Patent vertebrobasilar junction. No basilar stenosis. SCA and PCA origins are normal. Normal left posterior communicating artery, the right is diminutive or absent. Mild irregularity in the bilateral PCA P2 segments with preserved distal flow.  Antegrade flow in both ICA siphons. Bilateral cavernous and supra clinoid segment irregularity, worse on the right where there is up to mild to moderate stenosis. Carotid termini remain patent. Ophthalmic and left posterior communicating artery origins are normal.  MCA and ACA origins are patent. Motion artifact at this level degrades detail of the bilateral MCA and ACA vessels. The M1 segment on the right is within  normal limits. There is irregularity of the left M1 segment (series 305, image 9) but this could be artifactual. No major MCA or ACA branch occlusion is evident.  IMPRESSION: 1. Multiple acute and subacute small vessel infarcts in the bilateral cerebral hemispheres. No associated mass effect or hemorrhage. Lesions involving the left MCA territory and right body of the corpus callosum appear to be have occurred most recently. 2. Underlying chronic severe small vessel ischemia. 3. Extensive anterior circulation atherosclerosis, most affecting the ICA siphons. Mild to moderate right ICA stenosis. 4. Motion artifact affecting detail of the bilateral MCA and ACA branches, no major branch occlusion identified.   Electronically Signed   By: Lars Pinks M.D.   On: 04/04/2013 14:53   Mr Brain Wo Contrast  04/04/2013   CLINICAL DATA:  63 year old male with hypertension and diabetes last seen normal at 2330 hr yesterday. Right facial droop and garbled speech. Initial encounter.  EXAM: MRI HEAD WITHOUT CONTRAST  MRA HEAD WITHOUT CONTRAST  TECHNIQUE: Multiplanar, multiecho pulse sequences of the brain and surrounding structures were obtained without intravenous contrast. Angiographic images of the head were obtained using MRA technique without contrast.  COMPARISON:  Head CT without contrast 04/04/2013.  FINDINGS: MRI HEAD FINDINGS  There are multiple bilateral foci of abnormal signal on the trace diffusion images. The areas which appear to be most restricted on ADC are in the posterior left MCA territory on series 5, image 22, and the right corpus callosum on image 23. The additional areas either demonstrate isointense ADC, or facilitated diffusion (T2 shine through).  Major intracranial vascular flow voids are preserved.  Extensive chronic small vessel ischemia throughout the brain. Chronic lacunar infarcts in the left thalamus, bilateral corona radiata (including associated chronic blood products e.g. left frontal horn),  right paracentral pons, and scattered in both cerebellar hemispheres. Patchy and confluent additional cerebral white matter T2 and FLAIR hyperintensity. No supratentorial cortical encephalomalacia identified. Occasional small areas of chronic blood products in the cerebral hemispheres, and multiple chronic micro hemorrhages in the brainstem and cerebellum.  No midline shift, mass effect, evidence of mass lesion, ventriculomegaly, extra-axial collection or acute intracranial hemorrhage. Cervicomedullary junction and pituitary are within normal limits. Negative visualized cervical spine. Visualized orbit soft tissues are within normal limits. Minor paranasal sinus mucosal thickening. Trace mastoid fluid and retained secretions in the nasopharynx. Visualized scalp soft tissues are within normal limits. Visualized bone marrow  signal is within normal limits.  MRA HEAD FINDINGS  Antegrade flow in the posterior circulation with dominant distal left vertebral artery. Normal left PICA origin. Dominant right AICA. Patent vertebrobasilar junction. No basilar stenosis. SCA and PCA origins are normal. Normal left posterior communicating artery, the right is diminutive or absent. Mild irregularity in the bilateral PCA P2 segments with preserved distal flow.  Antegrade flow in both ICA siphons. Bilateral cavernous and supra clinoid segment irregularity, worse on the right where there is up to mild to moderate stenosis. Carotid termini remain patent. Ophthalmic and left posterior communicating artery origins are normal.  MCA and ACA origins are patent. Motion artifact at this level degrades detail of the bilateral MCA and ACA vessels. The M1 segment on the right is within normal limits. There is irregularity of the left M1 segment (series 305, image 9) but this could be artifactual. No major MCA or ACA branch occlusion is evident.  IMPRESSION: 1. Multiple acute and subacute small vessel infarcts in the bilateral cerebral  hemispheres. No associated mass effect or hemorrhage. Lesions involving the left MCA territory and right body of the corpus callosum appear to be have occurred most recently. 2. Underlying chronic severe small vessel ischemia. 3. Extensive anterior circulation atherosclerosis, most affecting the ICA siphons. Mild to moderate right ICA stenosis. 4. Motion artifact affecting detail of the bilateral MCA and ACA branches, no major branch occlusion identified.   Electronically Signed   By: Lars Pinks M.D.   On: 04/04/2013 14:53   Dg Foot Complete Left  04/05/2013   CLINICAL DATA:  Ulceration of the left great toe  EXAM: LEFT FOOT - COMPLETE 3+ VIEW  COMPARISON:  DG TOES 2+V*L* dated 10/07/2012  FINDINGS: There is no acute fracture or dislocation. There is is soft tissue ulcer along medial aspect of the left great toe. There is erosion of the distal tuft of the first distal phalanx concerning for osteomyelitis. There is no soft tissue emphysema. There are mild osteoarthritic changes of the first MTP joint. There is no acute fracture or dislocation.  IMPRESSION: Soft tissue ulceration along the medial aspect of the left great toe with erosion of the distal tuft of the first distal phalanx concerning for osteomyelitis. There is no soft tissue emphysema.   Electronically Signed   By: Kathreen Devoid   On: 04/05/2013 17:31    Scheduled Meds: . aspirin  324 mg Oral Once  . aspirin  300 mg Rectal Daily   Or  . aspirin  325 mg Oral Daily  . insulin aspart  0-9 Units Subcutaneous 6 times per day  . insulin glargine  15 Units Subcutaneous QHS  . research study medication  75 mg Oral Q breakfast  . simvastatin  10 mg Oral q1800   Continuous Infusions: . sodium chloride Stopped (04/06/13 0800)    Principal Problem:   CVA (cerebral infarction) Active Problems:   Diabetes   Acute encephalopathy   Acute renal failure   Dysphagia    Time spent: 25 minutes.     Niel Hummer A  [facility] Pager  [phone]. If 7PM-7AM, please contact night-coverage at [email or web address], password [email or web address] 04/06/2013, 12:10 PM  LOS: 2 days

## 2013-04-06 NOTE — Consult Note (Signed)
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 Patient with diabetes. Wound type: Diabetic foot ulceration Pressure Ulcer POA: No Measurement:2cm x 0.5cm x 0.2cm Wound ZOX:WRUEAbed:Clean, pale pink and moist. Drainage (amount, consistency, odor)  Periwound:Callus formation Dressing procedure/placement/frequency: I will implement conservative therapy while in house, ideally patient will consent to referral to either a podiatrist or an outpatient wound care center of his choosing for continued follow up of this wound.  He will require off-loading shoe wear that is protective in nature, paring of the callus formations and regular reassessments until resolved. WOC nursing team will not follow, but will remain available to this patient, the nursing and medical team.  Please re-consult if needed. Thanks, Ladona MowLaurie Gema Ringold, MSN, RN, GNP, [place], CWON-AP [phone])

## 2013-04-07 ENCOUNTER — Encounter (HOSPITAL_COMMUNITY): Payer: Self-pay | Admitting: Gastroenterology

## 2013-04-07 ENCOUNTER — Encounter (HOSPITAL_COMMUNITY)
Admission: EM | Disposition: A | Discharge status: Home Health | Payer: Medicaid Other | Source: Home / Self Care | Attending: Internal Medicine

## 2013-04-07 DIAGNOSIS — I059 Rheumatic mitral valve disease, unspecified: Secondary | ICD-10-CM

## 2013-04-07 HISTORY — PX: TEE WITHOUT CARDIOVERSION: SHX5443

## 2013-04-07 LAB — GLUCOSE, CAPILLARY
GLUCOSE-CAPILLARY: 112 mg/dL — AB (ref 70–99)
GLUCOSE-CAPILLARY: 176 mg/dL — AB (ref 70–99)
GLUCOSE-CAPILLARY: 239 mg/dL — AB (ref 70–99)
GLUCOSE-CAPILLARY: 310 mg/dL — AB (ref 70–99)
Glucose-Capillary: 131 mg/dL — ABNORMAL HIGH (ref 70–99)
Glucose-Capillary: 134 mg/dL — ABNORMAL HIGH (ref 70–99)
Glucose-Capillary: 188 mg/dL — ABNORMAL HIGH (ref 70–99)

## 2013-04-07 SURGERY — ECHOCARDIOGRAM, TRANSESOPHAGEAL
Anesthesia: Moderate Sedation

## 2013-04-07 MED ORDER — MIDAZOLAM HCL 5 MG/ML IJ SOLN
INTRAMUSCULAR | Status: AC
  Filled 2013-04-07: qty 2

## 2013-04-07 MED ORDER — FENTANYL CITRATE 0.05 MG/ML IJ SOLN
INTRAMUSCULAR | Status: AC
  Filled 2013-04-07: qty 2

## 2013-04-07 MED ORDER — DIPHENHYDRAMINE HCL 50 MG/ML IJ SOLN
INTRAMUSCULAR | Status: AC
  Filled 2013-04-07: qty 1

## 2013-04-07 MED ORDER — MIDAZOLAM HCL 10 MG/2ML IJ SOLN
INTRAMUSCULAR | Status: DC | PRN
  Administered 2013-04-07 (×2): 2 mg via INTRAVENOUS

## 2013-04-07 MED ORDER — FENTANYL CITRATE 0.05 MG/ML IJ SOLN
INTRAMUSCULAR | Status: DC | PRN
  Administered 2013-04-07: 25 ug via INTRAVENOUS

## 2013-04-07 MED ORDER — BUTAMBEN-TETRACAINE-BENZOCAINE 2-2-14 % EX AERO
INHALATION_SPRAY | CUTANEOUS | Status: DC | PRN
  Administered 2013-04-07: 2 via TOPICAL

## 2013-04-07 NOTE — Progress Notes (Signed)
Physical Therapy Treatment Patient Details Name: Manuel Higgins MRN: [id number] DOB: 12/29/1950 Today's Date: 04/07/2013 Time: [id number] PT Time Calculation (min): 10 min  PT Assessment / Plan / Recommendation  History of Present Illness 63 y.o. [facility] with right sided weakness and difficulty speaking.  He was admitted for stroke workup.  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     PT Comments   Pt adamantly refusing to use the cane I brought him to try during gait.  He will not give any reason why he will not try the cane.  Pt continues to be unsteady on his feet, and have significantly decreased awareness of his deficits.  PT continues to recommend cane, but he will likely not use it at discharge.   Follow Up Recommendations  Home health PT;Supervision/Assistance - 24 hour     Does the patient have the potential to tolerate intense rehabilitation   NA  Barriers to Discharge   None      Equipment Recommendations  Other (comment);Cane (pt refusing to use, so unless agreeable don't get him one)    Recommendations for Other Services   None  Frequency Min 4X/week   Progress towards PT Goals Progress towards PT goals: Progressing toward goals  Plan Current plan remains appropriate    Precautions / Restrictions Precautions Precautions: Fall Precaution Comments: [facility] case manager), pt is unsteady on his feet.    Pertinent Vitals/Pain See vitals flow sheet.    Mobility  Transfers Overall transfer level: Needs assistance Equipment used: None Transfers: Sit to/from Stand Sit to Stand: Supervision General transfer comment: supervision for safety Ambulation/Gait Ambulation/Gait assistance: Min guard Ambulation Distance (Feet): 350 Feet Assistive device: None ([facility] a [facility]) Gait Pattern/deviations: Step-through pattern;Staggering right;Staggering  left General Gait Details: min guard assist for the occational LOB during gait.  Pt with staggering pattern yet when asked he thinks his balance and walking are fine, no deficits noted.   Stairs: Yes Stairs assistance: Supervision Stair Management: One rail Right;One rail Left;Alternating pattern;Step to pattern;Forwards (alt up step to down) Number of Stairs: 9 General stair comments: pt with decreased eccentric quad control when descending the stairs.  Uncontrolled lowering of his body.   Modified Rankin (Stroke Patients Only) Pre-Morbid Rankin Score:  (unsure of baseline) Modified Rankin: Moderately severe disability      PT Goals (current goals can now be found in the care plan section) Acute Rehab PT Goals Patient Stated Goal: did not state  Visit Information  Last PT Received On: 04/07/13 Assistance Needed: +1 History of Present Illness: 63 [facility]. [facility] with right sided weakness and difficulty speaking.  He was admitted for stroke workup.  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      Subjective Data  Subjective: Pt is adamately refusing cane.  No reason given as to why he doesn't want to use it.  Pt will not even practice with the cane.  PT educated on reasons she is recommending the cane and pt is still adamantly refusing to use the cane.  Patient Stated Goal: did not state   Cognition  Cognition Arousal/Alertness: Awake/alert Behavior During Therapy: Flat affect Overall Cognitive Status: Impaired/Different from baseline Area of Impairment: Safety/judgement;Awareness Safety/Judgement: Decreased awareness of safety;Decreased awareness of deficits Awareness: Intellectual (if that.  Pt reports  he has no balance problems) General Comments: Pt unaware of his deficits.  Therapist cannot reson with him re: using a cane for safety and balance issues.     Balance  Balance Overall  balance assessment: Needs assistance Standing balance support: No upper extremity supported Standing balance-Leahy Scale: Fair Standardized Balance Assessment Standardized Balance Assessment : Dynamic Gait Index Dynamic Gait Index Level Surface: Mild Impairment Change in Gait Speed: Severe Impairment Gait and Pivot Turn: Moderate Impairment Step Around Obstacles: Severe Impairment Steps: Moderate Impairment General Comments General comments (skin integrity, edema, etc.): Attempted DGI, but pt not cooperative and wanting to return to his room.    End of Session PT - End of Session Equipment Utilized During Treatment: Gait belt Activity Tolerance: Patient limited by fatigue Patient left: in chair;with call bell/phone within reach;with chair alarm set     Karlisa Gaubert B. Jobani Sabado, PT, DPT [phone]   04/07/2013, 6:07 PM

## 2013-04-07 NOTE — CV Procedure (Addendum)
     Transesophageal Echocardiogram Note  Manuel PanderDarrell Higgins [id number] 06/23/1950  Procedure: Transesophageal Echocardiogram Indications: Stroke  Procedure Details Consent: Obtained Time Out: Verified patient identification, verified procedure, site/side was marked, verified correct patient position, special equipment/implants available, Radiology Safety Procedures followed,  medications/allergies/relevent history reviewed, required imaging and test results available.  Performed  Medications: Fentanyl:  25 mcg Versed: 4 mg  Left Ventrical:  The cavity size was normal. Wall thickness was increased in a pattern of moderate LVH. Systolic function was normal. The estimated ejection fraction was in the range of 60% to 65%. Wall motion was normal; there were no regional wall motion abnormalities.  Mitral Valve: Trace MR. No vegetation.   Aortic Valve: No AI. No vegetation.   Tricuspid Valve: No TR, no vegetation.   Pulmonic Valve: No PR, no vegetation.   Left Atrium/ Left atrial appendage: Normal, no thrombus in LA or LAA.  RA/RAA: No thrombus.  Atrial septum: No PFO by color Doppler or agitated bubble study, with and without maneuvers.   Aorta: Minimal AS plague.   Complications: No apparent complications Patient did tolerate procedure well.  Manuel MassonNELSON, Manuel Alvira H, MD, Manuel Higgins 04/07/2013, 10:48 AM

## 2013-04-07 NOTE — Consult Note (Signed)
Reason for Consult:left hallux ulcer Referring Physician: Dr. Charlott Rakes Manuel Higgins is an 63 y.o. male.  HPI: 63 y/o male with PMH of diabetes and smoking is admitted at this time for stroke.  He says he's had an ulcer on the left hallux for at least a month.  Denies any pain.  No h/o f/c/n/v/wt loss.  No significant drainage from the wound.  No h/o amputations.  He is not working.  Past Medical History  Diagnosis Date  . Hypertension   . Diabetes mellitus without complication     Past Surgical History  Procedure Laterality Date  . Back surgery    . Tonsillectomy      History reviewed. No pertinent family history.  Social History:  reports that he has been smoking Cigarettes.  He has a 3.3 pack-year smoking history. He has never used smokeless tobacco. He reports that he does not drink alcohol or use illicit drugs.  Allergies: No Known Allergies  Medications: I have reviewed the patient's current medications.  Results for orders placed during the hospital encounter of 04/04/13 (from the past 48 hour(s))  GLUCOSE, CAPILLARY     Status: Abnormal   Collection Time    04/05/13 11:40 AM      Result Value Ref Range   Glucose-Capillary 311 (*) 70 - 99 mg/dL  CULTURE, BLOOD (ROUTINE X 2)     Status: None   Collection Time    04/05/13  2:47 PM      Result Value Ref Range   Specimen Description BLOOD RIGHT ARM     Special Requests BOTTLES DRAWN AEROBIC AND ANAEROBIC 10 CC     Culture  Setup Time       Value: 04/05/2013 20:19     Performed at [facility]   Culture       Value:        BLOOD CULTURE RECEIVED NO GROWTH TO DATE CULTURE WILL BE HELD FOR 5 DAYS BEFORE ISSUING A FINAL NEGATIVE REPORT     Performed at [facility]   Report Status PENDING    CULTURE, BLOOD (ROUTINE X 2)     Status: None   Collection Time    04/05/13  3:01 PM      Result Value Ref Range   Specimen Description BLOOD RIGHT HAND     Special Requests BOTTLES DRAWN AEROBIC ONLY 10 CC      Culture  Setup Time       Value: 04/05/2013 20:18     Performed at [facility]   Culture       Value:        BLOOD CULTURE RECEIVED NO GROWTH TO DATE CULTURE WILL BE HELD FOR 5 DAYS BEFORE ISSUING A FINAL NEGATIVE REPORT     Performed at [facility]   Report Status PENDING    GLUCOSE, CAPILLARY     Status: Abnormal   Collection Time    04/05/13  5:41 PM      Result Value Ref Range   Glucose-Capillary 205 (*) 70 - 99 mg/dL  GLUCOSE, CAPILLARY     Status: Abnormal   Collection Time    04/05/13  8:10 PM      Result Value Ref Range   Glucose-Capillary 429 (*) 70 - 99 mg/dL  GLUCOSE, CAPILLARY     Status: Abnormal   Collection Time    04/06/13 12:33 AM      Result Value Ref Range   Glucose-Capillary 243 (*) 70 -  99 mg/dL  CBC     Status: Abnormal   Collection Time    04/06/13  3:42 AM      Result Value Ref Range   WBC 5.9  4.0 - 10.5 K/uL   RBC 3.78 (*) 4.22 - 5.81 MIL/uL   Hemoglobin 11.6 (*) 13.0 - 17.0 g/dL   HCT 34.3 (*) 39.0 - 52.0 %   MCV 90.7  78.0 - 100.0 fL   MCH 30.7  26.0 - 34.0 pg   MCHC 33.8  30.0 - 36.0 g/dL   RDW 12.1  11.5 - 15.5 %   Platelets 203  150 - 400 K/uL  BASIC METABOLIC PANEL     Status: Abnormal   Collection Time    04/06/13  3:42 AM      Result Value Ref Range   Sodium 138  137 - 147 mEq/L   Potassium 4.1  3.7 - 5.3 mEq/L   Chloride 103  96 - 112 mEq/L   CO2 24  19 - 32 mEq/L   Glucose, Bld 226 (*) 70 - 99 mg/dL   BUN 14  6 - 23 mg/dL   Comment: DELTA CHECK NOTED   Creatinine, Ser 0.94  0.50 - 1.35 mg/dL   Calcium 9.0  8.4 - 10.5 mg/dL   GFR calc non Af Amer 88 (*) >90 mL/min   GFR calc Af Amer >90  >90 mL/min   Comment: (NOTE)     The eGFR has been calculated using the CKD EPI equation.     This calculation has not been validated in all clinical situations.     eGFR's persistently <90 mL/min signify possible Chronic Kidney     Disease.  GLUCOSE, CAPILLARY     Status: Abnormal   Collection Time    04/06/13  4:40 AM       Result Value Ref Range   Glucose-Capillary 207 (*) 70 - 99 mg/dL  GLUCOSE, CAPILLARY     Status: Abnormal   Collection Time    04/06/13  7:45 AM      Result Value Ref Range   Glucose-Capillary 143 (*) 70 - 99 mg/dL  GLUCOSE, CAPILLARY     Status: Abnormal   Collection Time    04/06/13 11:30 AM      Result Value Ref Range   Glucose-Capillary 187 (*) 70 - 99 mg/dL  SEDIMENTATION RATE     Status: Abnormal   Collection Time    04/06/13  3:15 PM      Result Value Ref Range   Sed Rate 40 (*) 0 - 16 mm/hr  GLUCOSE, CAPILLARY     Status: Abnormal   Collection Time    04/06/13  4:48 PM      Result Value Ref Range   Glucose-Capillary 237 (*) 70 - 99 mg/dL  GLUCOSE, CAPILLARY     Status: Abnormal   Collection Time    04/06/13  8:51 PM      Result Value Ref Range   Glucose-Capillary 255 (*) 70 - 99 mg/dL  GLUCOSE, CAPILLARY     Status: Abnormal   Collection Time    04/07/13 12:46 AM      Result Value Ref Range   Glucose-Capillary 176 (*) 70 - 99 mg/dL  GLUCOSE, CAPILLARY     Status: Abnormal   Collection Time    04/07/13  4:27 AM      Result Value Ref Range   Glucose-Capillary 131 (*) 70 - 99 mg/dL    Dg Foot  Complete Left  04/05/2013   CLINICAL DATA:  Ulceration of the left great toe  EXAM: LEFT FOOT - COMPLETE 3+ VIEW  COMPARISON:  DG TOES 2+V*L* dated 10/07/2012  FINDINGS: There is no acute fracture or dislocation. There is is soft tissue ulcer along medial aspect of the left great toe. There is erosion of the distal tuft of the first distal phalanx concerning for osteomyelitis. There is no soft tissue emphysema. There are mild osteoarthritic changes of the first MTP joint. There is no acute fracture or dislocation.  IMPRESSION: Soft tissue ulceration along the medial aspect of the left great toe with erosion of the distal tuft of the first distal phalanx concerning for osteomyelitis. There is no soft tissue emphysema.   Electronically Signed   By: Kathreen Devoid   On: 04/05/2013 17:31     ROS:  As above PE:  Blood pressure 147/52, pulse 75, temperature 98.2 F (36.8 C), temperature source Oral, resp. rate 16, height 5' 9"  (1.753 m), weight 75.322 kg (166 lb 0.9 oz), SpO2 99.00%. wn wd male innad.  A and O x 4.  Speech slow and slurred.  EOMI. Resp unlabored.  L hallux with 4 mm ulcer on plantar medial toe with abundant callous.  Sens to LT diminshed at the forefoot.  No palpable pulses in the foot.  5/5 strength in PF adn DF of the ankle and toes.  No other callouses.  No lymphadenopathy.  Assessment/Plan: L hallux ulcer and possible osteo - the lesion on xray is not at the site of the wound.  I believe an MRI will help determine if this represents osteo.  I'll order vascular u/s studies to assess blood flow as well.  I'll continue to follow.  Wylene Simmer 04/07/2013, 7:49 AM

## 2013-04-07 NOTE — Progress Notes (Signed)
  Echocardiogram Echocardiogram Transesophageal has been performed.  Manuel Higgins 04/07/2013, 10:56 AM

## 2013-04-07 NOTE — H&P (View-Only) (Signed)
Reason for Consult:left hallux ulcer Referring Physician: Dr. Charlott Rakes Manuel Higgins is an 63 y.o. male.  HPI: 63 y/o male with PMH of diabetes and smoking is admitted at this time for stroke.  He says he's had an ulcer on the left hallux for at least a month.  Denies any pain.  No h/o f/c/n/v/wt loss.  No significant drainage from the wound.  No h/o amputations.  He is not working.  Past Medical History  Diagnosis Date  . Hypertension   . Diabetes mellitus without complication     Past Surgical History  Procedure Laterality Date  . Back surgery    . Tonsillectomy      History reviewed. No pertinent family history.  Social History:  reports that he has been smoking Cigarettes.  He has a 3.3 pack-year smoking history. He has never used smokeless tobacco. He reports that he does not drink alcohol or use illicit drugs.  Allergies: No Known Allergies  Medications: I have reviewed the patient's current medications.  Results for orders placed during the hospital encounter of 04/04/13 (from the past 48 hour(s))  GLUCOSE, CAPILLARY     Status: Abnormal   Collection Time    04/05/13 11:40 AM      Result Value Ref Range   Glucose-Capillary 311 (*) 70 - 99 mg/dL  CULTURE, BLOOD (ROUTINE X 2)     Status: None   Collection Time    04/05/13  2:47 PM      Result Value Ref Range   Specimen Description BLOOD RIGHT ARM     Special Requests BOTTLES DRAWN AEROBIC AND ANAEROBIC 10 CC     Culture  Setup Time       Value: 04/05/2013 20:19     Performed at [facility]   Culture       Value:        BLOOD CULTURE RECEIVED NO GROWTH TO DATE CULTURE WILL BE HELD FOR 5 DAYS BEFORE ISSUING A FINAL NEGATIVE REPORT     Performed at [facility]   Report Status PENDING    CULTURE, BLOOD (ROUTINE X 2)     Status: None   Collection Time    04/05/13  3:01 PM      Result Value Ref Range   Specimen Description BLOOD RIGHT HAND     Special Requests BOTTLES DRAWN AEROBIC ONLY 10 CC      Culture  Setup Time       Value: 04/05/2013 20:18     Performed at [facility]   Culture       Value:        BLOOD CULTURE RECEIVED NO GROWTH TO DATE CULTURE WILL BE HELD FOR 5 DAYS BEFORE ISSUING A FINAL NEGATIVE REPORT     Performed at [facility]   Report Status PENDING    GLUCOSE, CAPILLARY     Status: Abnormal   Collection Time    04/05/13  5:41 PM      Result Value Ref Range   Glucose-Capillary 205 (*) 70 - 99 mg/dL  GLUCOSE, CAPILLARY     Status: Abnormal   Collection Time    04/05/13  8:10 PM      Result Value Ref Range   Glucose-Capillary 429 (*) 70 - 99 mg/dL  GLUCOSE, CAPILLARY     Status: Abnormal   Collection Time    04/06/13 12:33 AM      Result Value Ref Range   Glucose-Capillary 243 (*) 70 -  99 mg/dL  CBC     Status: Abnormal   Collection Time    04/06/13  3:42 AM      Result Value Ref Range   WBC 5.9  4.0 - 10.5 K/uL   RBC 3.78 (*) 4.22 - 5.81 MIL/uL   Hemoglobin 11.6 (*) 13.0 - 17.0 g/dL   HCT 34.3 (*) 39.0 - 52.0 %   MCV 90.7  78.0 - 100.0 fL   MCH 30.7  26.0 - 34.0 pg   MCHC 33.8  30.0 - 36.0 g/dL   RDW 12.1  11.5 - 15.5 %   Platelets 203  150 - 400 K/uL  BASIC METABOLIC PANEL     Status: Abnormal   Collection Time    04/06/13  3:42 AM      Result Value Ref Range   Sodium 138  137 - 147 mEq/L   Potassium 4.1  3.7 - 5.3 mEq/L   Chloride 103  96 - 112 mEq/L   CO2 24  19 - 32 mEq/L   Glucose, Bld 226 (*) 70 - 99 mg/dL   BUN 14  6 - 23 mg/dL   Comment: DELTA CHECK NOTED   Creatinine, Ser 0.94  0.50 - 1.35 mg/dL   Calcium 9.0  8.4 - 10.5 mg/dL   GFR calc non Af Amer 88 (*) >90 mL/min   GFR calc Af Amer >90  >90 mL/min   Comment: (NOTE)     The eGFR has been calculated using the CKD EPI equation.     This calculation has not been validated in all clinical situations.     eGFR's persistently <90 mL/min signify possible Chronic Kidney     Disease.  GLUCOSE, CAPILLARY     Status: Abnormal   Collection Time    04/06/13  4:40 AM       Result Value Ref Range   Glucose-Capillary 207 (*) 70 - 99 mg/dL  GLUCOSE, CAPILLARY     Status: Abnormal   Collection Time    04/06/13  7:45 AM      Result Value Ref Range   Glucose-Capillary 143 (*) 70 - 99 mg/dL  GLUCOSE, CAPILLARY     Status: Abnormal   Collection Time    04/06/13 11:30 AM      Result Value Ref Range   Glucose-Capillary 187 (*) 70 - 99 mg/dL  SEDIMENTATION RATE     Status: Abnormal   Collection Time    04/06/13  3:15 PM      Result Value Ref Range   Sed Rate 40 (*) 0 - 16 mm/hr  GLUCOSE, CAPILLARY     Status: Abnormal   Collection Time    04/06/13  4:48 PM      Result Value Ref Range   Glucose-Capillary 237 (*) 70 - 99 mg/dL  GLUCOSE, CAPILLARY     Status: Abnormal   Collection Time    04/06/13  8:51 PM      Result Value Ref Range   Glucose-Capillary 255 (*) 70 - 99 mg/dL  GLUCOSE, CAPILLARY     Status: Abnormal   Collection Time    04/07/13 12:46 AM      Result Value Ref Range   Glucose-Capillary 176 (*) 70 - 99 mg/dL  GLUCOSE, CAPILLARY     Status: Abnormal   Collection Time    04/07/13  4:27 AM      Result Value Ref Range   Glucose-Capillary 131 (*) 70 - 99 mg/dL    Dg Foot  Complete Left  04/05/2013   CLINICAL DATA:  Ulceration of the left great toe  EXAM: LEFT FOOT - COMPLETE 3+ VIEW  COMPARISON:  DG TOES 2+V*L* dated 10/07/2012  FINDINGS: There is no acute fracture or dislocation. There is is soft tissue ulcer along medial aspect of the left great toe. There is erosion of the distal tuft of the first distal phalanx concerning for osteomyelitis. There is no soft tissue emphysema. There are mild osteoarthritic changes of the first MTP joint. There is no acute fracture or dislocation.  IMPRESSION: Soft tissue ulceration along the medial aspect of the left great toe with erosion of the distal tuft of the first distal phalanx concerning for osteomyelitis. There is no soft tissue emphysema.   Electronically Signed   By: Kathreen Devoid   On: 04/05/2013 17:31     ROS:  As above PE:  Blood pressure 147/52, pulse 75, temperature 98.2 F (36.8 C), temperature source Oral, resp. rate 16, height 5' 9"  (1.753 m), weight 75.322 kg (166 lb 0.9 oz), SpO2 99.00%. wn wd male innad.  A and O x 4.  Speech slow and slurred.  EOMI. Resp unlabored.  L hallux with 4 mm ulcer on plantar medial toe with abundant callous.  Sens to LT diminshed at the forefoot.  No palpable pulses in the foot.  5/5 strength in PF adn DF of the ankle and toes.  No other callouses.  No lymphadenopathy.  Assessment/Plan: L hallux ulcer and possible osteo - the lesion on xray is not at the site of the wound.  I believe an MRI will help determine if this represents osteo.  I'll order vascular u/s studies to assess blood flow as well.  I'll continue to follow.  Wylene Simmer 04/07/2013, 7:49 AM

## 2013-04-07 NOTE — Progress Notes (Signed)
[facility] PROGRESS NOTE  Manuel Higgins IWO:[id number] DOB: 05-31-1950 DOA: 04/04/2013 PCP: [name]  Assessment/Plan: 1-Multiple acute and subacute small vessel infarcts in the bilateral cerebral hemispheres;  Doppler; Findings suggest 1-39% internal carotid artery stenosis bilaterally. Vertebral arteries are patent with antegrade flow. ECHO; No source of thrombus.  Continue with aspirin.  Need better diabetes controlled.  LDL 101. Started simvastatin.  TEE negative for vegetation.  Neuro following.   2-left big toe ulcer;  x ray: Soft tissue ulceration along the medial aspect of the left great toe with erosion of the distal tuft of the first distal phalanx concerning for osteomyelitis. There is no soft tissue emphysema. - Blood culture no growth to date.  -Wound care recommendation appreciated.  -Appreciate Dr Doran Durand recommendation.  -MRI pending, Ankle brachial index pending. ESR 40.   3-Diabetes; Continue with lantus. SSI. HB A-1c; 11.4.  4-Acute renal failure resolved with IV fluids. Cr peak to 1.4  5-Dysphagia; dysphagia diet.  6-Hypertension; permissive hypertension. Follow trend. Blood pressure increasing. Will consider start Norvasc.   Code Status: Full Code.  Family Communication: Care discussed with patient.  Disposition Plan: Remain inpatient.    Consultants:  neurology  Procedures: ECHO;Left ventricle: The cavity size was normal. Wall thickness was increased in a pattern of moderate LVH. Systolic function was normal. The estimated ejection fraction was in the range of 60% to 65%. Wall motion was normal; there were no regional wall motion abnormalities. Doppler parameters are consistent with abnormal left ventricular relaxation (grade 1 diastolic dysfunction).   Doppler; Findings suggest 1-39% internal carotid artery stenosis bilaterally. Vertebral arteries are patent with antegrade flow.   Antibiotics:  none  HPI/Subjective: Patient  denies chest pain, dyspnea.  Explain to him importance of getting TEE.   Objective: Filed Vitals:   04/07/13 1051  BP: 151/82  Pulse: 69  Temp:   Resp: 15    Intake/Output Summary (Last 24 hours) at 04/07/13 1104 Last data filed at 04/07/13 1057  Gross per 24 hour  Intake    200 ml  Output   2175 ml  Net  -1975 ml   Filed Weights   04/04/13 0534 04/04/13 0922 04/05/13 0335  Weight: 78.019 kg (172 lb) 75.116 kg (165 lb 9.6 oz) 75.322 kg (166 lb 0.9 oz)    Exam:   General:  No distress.   Cardiovascular: S 1, S 2 RRR  Respiratory: CTA  Abdomen: Bs present, soft, nt  Musculoskeletal: trace edema, left big toe with dry ulceration, no drainage.   Neuro; speech less disarthric, motor strength  5/5. Right facial droop  Data Reviewed: Basic Metabolic Panel:  Recent Labs Lab 04/04/13 0539 04/04/13 0551 04/06/13 0342  NA 141 140 138  K 4.1 4.3 4.1  CL 100 102 103  CO2 25  --  24  GLUCOSE 174* 169* 226*  BUN 21 28* 14  CREATININE 1.24 1.40* 0.94  CALCIUM 9.7  --  9.0   Liver Function Tests:  Recent Labs Lab 04/04/13 0539  AST 15  ALT 12  ALKPHOS 152*  BILITOT 0.2*  PROT 7.3  ALBUMIN 3.5   No results found for this basename: LIPASE, AMYLASE,  in the last 168 hours No results found for this basename: AMMONIA,  in the last 168 hours CBC:  Recent Labs Lab 04/04/13 0539 04/04/13 0551 04/06/13 0342  WBC 7.9  --  5.9  NEUTROABS 5.5  --   --   HGB 13.3 13.9 11.6*  HCT 37.5* 41.0 34.3*  MCV 89.3  --  90.7  PLT 192  --  203   Cardiac Enzymes: No results found for this basename: CKTOTAL, CKMB, CKMBINDEX, TROPONINI,  in the last 168 hours BNP (last 3 results) No results found for this basename: PROBNP,  in the last 8760 hours CBG:  Recent Labs Lab 04/06/13 1648 04/06/13 2051 04/07/13 0046 04/07/13 0427 04/07/13 0802  GLUCAP 237* 255* 176* 131* 134*    Recent Results (from the past 240 hour(s))  CULTURE, BLOOD (ROUTINE X 2)     Status: None    Collection Time    04/05/13  2:47 PM      Result Value Ref Range Status   Specimen Description BLOOD RIGHT ARM   Final   Special Requests BOTTLES DRAWN AEROBIC AND ANAEROBIC 10 CC   Final   Culture  Setup Time     Final   Value: 04/05/2013 20:19     Performed at [facility]   Culture     Final   Value:        BLOOD CULTURE RECEIVED NO GROWTH TO DATE CULTURE WILL BE HELD FOR 5 DAYS BEFORE ISSUING A FINAL NEGATIVE REPORT     Performed at [facility]   Report Status PENDING   Incomplete  CULTURE, BLOOD (ROUTINE X 2)     Status: None   Collection Time    04/05/13  3:01 PM      Result Value Ref Range Status   Specimen Description BLOOD RIGHT HAND   Final   Special Requests BOTTLES DRAWN AEROBIC ONLY 10 CC   Final   Culture  Setup Time     Final   Value: 04/05/2013 20:18     Performed at [facility]   Culture     Final   Value:        BLOOD CULTURE RECEIVED NO GROWTH TO DATE CULTURE WILL BE HELD FOR 5 DAYS BEFORE ISSUING A FINAL NEGATIVE REPORT     Performed at [facility]   Report Status PENDING   Incomplete     Studies: Dg Foot Complete Left  04/05/2013   CLINICAL DATA:  Ulceration of the left great toe  EXAM: LEFT FOOT - COMPLETE 3+ VIEW  COMPARISON:  DG TOES 2+V*L* dated 10/07/2012  FINDINGS: There is no acute fracture or dislocation. There is is soft tissue ulcer along medial aspect of the left great toe. There is erosion of the distal tuft of the first distal phalanx concerning for osteomyelitis. There is no soft tissue emphysema. There are mild osteoarthritic changes of the first MTP joint. There is no acute fracture or dislocation.  IMPRESSION: Soft tissue ulceration along the medial aspect of the left great toe with erosion of the distal tuft of the first distal phalanx concerning for osteomyelitis. There is no soft tissue emphysema.   Electronically Signed   By: Kathreen Devoid   On: 04/05/2013 17:31    Scheduled Meds: . [facility] HOLD] aspirin  324  mg Oral Once  . [facility] HOLD] aspirin  300 mg Rectal Daily   Or  . [facility] HOLD] aspirin  325 mg Oral Daily  . [[facility] HOLD] insulin aspart  0-9 Units Subcutaneous 6 times per day  . [[facility] HOLD] insulin glargine  15 Units Subcutaneous QHS  . [facility] HOLD] research study medication  75 mg Oral Q breakfast  . [[facility] HOLD] simvastatin  10 mg Oral q1800   Continuous Infusions: . sodium chloride 500 mL (04/07/13  1014)    Principal Problem:   CVA (cerebral infarction) Active Problems:   Diabetes   Acute encephalopathy   Acute renal failure   Dysphagia    Time spent: 25 minutes.     Niel Hummer A  [facility] Pager [phone]. If 7PM-7AM, please contact night-coverage at [email or web address], password [facility] 04/07/2013, 11:04 AM  LOS: 3 days

## 2013-04-07 NOTE — Progress Notes (Signed)
Stroke Team Progress Note  HISTORY  63 y.o. male with a history of hypertension and diabetes mellitus presenting with new onset slurred speech and right facial droop. No history of stroke nor TIA. Patient had no extremity weakness. CT scan of the head showed no acute intracranial abnormality. NIH stroke score was 2.    SUBJECTIVE Patient just back from endoscopy.  OBJECTIVE Most recent Vital Signs: Filed Vitals:   04/07/13 1030 04/07/13 1035 04/07/13 1040 04/07/13 1051  BP: 183/67 173/79 174/87 151/82  Pulse: 83 76 71 69  Temp:      TempSrc:      Resp: 25 17 13 15   Height:      Weight:      SpO2: 100% 100% 100% 100%   CBG (last 3)   Recent Labs  04/07/13 0427 04/07/13 0802 04/07/13 1129  GLUCAP 131* 134* 112*    IV Fluid Intake:      MEDICATIONS  . aspirin  324 mg Oral Once  . aspirin  300 mg Rectal Daily   Or  . aspirin  325 mg Oral Daily  . insulin aspart  0-9 Units Subcutaneous 6 times per day  . insulin glargine  15 Units Subcutaneous QHS  . research study medication  75 mg Oral Q breakfast  . simvastatin  10 mg Oral q1800   PRN:  acetaminophen, acetaminophen, hydrALAZINE  Diet:  Dysphagia 2 thin liquids Activity:   Up with assistance DVT Prophylaxis:  SCDs  CLINICALLY SIGNIFICANT STUDIES Basic Metabolic Panel:   Recent Labs Lab 04/04/13 0539 04/04/13 0551 04/06/13 0342  NA 141 140 138  K 4.1 4.3 4.1  CL 100 102 103  CO2 25  --  24  GLUCOSE 174* 169* 226*  BUN 21 28* 14  CREATININE 1.24 1.40* 0.94  CALCIUM 9.7  --  9.0   Liver Function Tests:   Recent Labs Lab 04/04/13 0539  AST 15  ALT 12  ALKPHOS 152*  BILITOT 0.2*  PROT 7.3  ALBUMIN 3.5   CBC:   Recent Labs Lab 04/04/13 0539 04/04/13 0551 04/06/13 0342  WBC 7.9  --  5.9  NEUTROABS 5.5  --   --   HGB 13.3 13.9 11.6*  HCT 37.5* 41.0 34.3*  MCV 89.3  --  90.7  PLT 192  --  203   Coagulation:   Recent Labs Lab 04/04/13 0539  LABPROT 13.2  INR 1.02   Cardiac  Enzymes: No results found for this basename: CKTOTAL, CKMB, CKMBINDEX, TROPONINI,  in the last 168 hours Urinalysis:   Recent Labs Lab 04/04/13 0838  COLORURINE YELLOW  LABSPEC 1.030  PHURINE 6.0  GLUCOSEU >1000*  HGBUR NEGATIVE  BILIRUBINUR NEGATIVE  KETONESUR NEGATIVE  PROTEINUR 30*  UROBILINOGEN 0.2  NITRITE NEGATIVE  LEUKOCYTESUR NEGATIVE   Lipid Panel    Component Value Date/Time   CHOL 173 04/05/2013 0309   TRIG 113 04/05/2013 0309   HDL 49 04/05/2013 0309   CHOLHDL 3.5 04/05/2013 0309   VLDL 23 04/05/2013 0309   LDLCALC 101* 04/05/2013 0309   HgbA1C  Lab Results  Component Value Date   HGBA1C 11.4* 04/05/2013    Urine Drug Screen:     Component Value Date/Time   LABOPIA NONE DETECTED 04/04/2013 0838   COCAINSCRNUR NONE DETECTED 04/04/2013 0838   LABBENZ NONE DETECTED 04/04/2013 0838   AMPHETMU NONE DETECTED 04/04/2013 0838   THCU NONE DETECTED 04/04/2013 0838   LABBARB NONE DETECTED 04/04/2013 0838    Alcohol Level:  Recent Labs Lab 04/04/13 0539  ETH <11    CT of the brain 04/04/2013    No acute abnormality.  Atrophy and chronic microvascular ischemic change. Stable compared to prior exam.    MRI of the brain  04/04/2013    1. Multiple acute and subacute small vessel infarcts in the bilateral cerebral hemispheres. No associated mass effect or hemorrhage. Lesions involving the left MCA territory and right body of the corpus callosum appear to be have occurred most recently. 2. Underlying chronic severe small vessel ischemia.   MRA of the brain  04/04/2013   Extensive anterior circulation atherosclerosis, most affecting the ICA siphons. Mild to moderate right ICA stenosis.  Motion artifact affecting detail of the bilateral MCA and ACA branches, no major branch occlusion identified.     2D Echocardiogram  Left ventricle: The cavity size was normal. Wall thickness was increased in a pattern of moderate LVH. Systolic function was normal. The estimated ejection fraction  was in the range of 60% to 65%. Wall motion was normal; there were no regional wall motion abnormalities. Doppler parameters are consistent with abnormal left ventricular relaxation (grade 1 diastolic dysfunction).   Carotid Doppler  No hemodynamic stenosis  TEE 04/07/2013 no PFO, no source or embolus  CXR  04/04/2013    No active cardiopulmonary disease.     EKG  Sinus rhythm   Dg Foot Complete Left  04/05/2013   Soft tissue ulceration along the medial aspect of the left great toe with erosion of the distal tuft of the first distal phalanx concerning for osteomyelitis. There is no soft tissue emphysema.    Therapy Recommendations HH PT, OT  Physical Exam   Neurologic Examination:  Mental Status:  Alert, oriented, somewhat slow to respond. Speech dysarthric without evidence of aphasia. Able to follow commands without difficulty.  Cranial Nerves:  II-Visual fields were normal.  III/IV/VI-Pupils were equal and reacted. Extraocular movements were full and conjugate.  V/VII-no facial numbness; mild right lower facial weakness.  VIII-normal.  X-mild to moderate dysarthria.  XII-midline tongue extension  Motor: 5/5 bilaterally with normal tone and bulk  Sensory: Normal throughout.  Deep Tendon Reflexes: 2+ and symmetric.  Plantars: Flexor bilaterally  Cerebellar: Normal finger-to-nose testing  ASSESSMENT [name] is a 63 y.o. male presenting with R facial droop and dysarthria. 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 Infarcts felt to be embolic secondary to unknown etiology.  TEE negative for source. On no antithrombotics prior to admission. Now enrolled in POINT study for secondary stroke prevention.    Hyperlipidemia, LDL 101, on no statin PTA, now on zocor 10 mg daily, goal LDL < 70 for diabetics  hypertension  Diabetes, uncontrolled, HgbA1c 11.4, goal < 7.0  Cigarette smoker   Left big toe ulcer, wound care following. Dr. Lajoyce Corners to to  consult.  Hospital day # 3  TREATMENT/PLAN  Continue POINT study drug and ASA 325 mg daily for secondary stroke prevention. POINT is a randomized, double-blind, multicenter clinical trial to determine whether clopidogrel 75mg /day (after a loading dose of 600mg ) is effective in improving survival free from major ischemic vascular events (ischemic stroke, myocardial infarction, and ischemic vascular death) at 90 days when initiated within 12 hours time last known free of new ischemic symptoms of TIA or minor ischemic stroke in subjects receiving aspirin 50-325mg /day. Please contact Lorenza Burton at [facility] at [phone] for any questions.   Consider OP Tele to look for A-fib due to b/l embolic strokes  vs loop. Will discuss with [facility] health PT and OT  Annie Main, MSN, RN, ANVP-BC, ANP-BC, [facility] Pager: [phone] 04/07/2013 6:51 PM  I have personally obtained a history, examined the patient, evaluated imaging results, and formulated the assessment and plan of care. I agree with the above. Delia Heady, MD   To contact Stroke Continuity provider, please refer to [email or web address]. After hours, contact General Neurology

## 2013-04-07 NOTE — Interval H&P Note (Signed)
History and Physical Interval Note:  04/07/2013 9:35 AM  Manuel Higgins  has presented today for surgery, with the diagnosis of stroke  The various methods of treatment have been discussed with the patient and family. After consideration of risks, benefits and other options for treatment, the patient has consented to  Procedure(s): TRANSESOPHAGEAL ECHOCARDIOGRAM (TEE) (N/A) as a surgical intervention .  The patient's history has been reviewed, patient examined, no change in status, stable for surgery.  I have reviewed the patient's chart and labs.  Questions were answered to the patient's satisfaction.     Lars MassonNELSON, Nishant Schrecengost H

## 2013-04-07 NOTE — Progress Notes (Signed)
Occupational Therapy Treatment Patient Details Name: Manuel Higgins MRN: [id number] DOB: 02/06/1950 Today's Date: 04/07/2013 Time: [id number] OT Time Calculation (min): 25 min  OT Assessment / Plan / Recommendation  History of present illness Pt [facility]urred [facility] MRI revealed multiple B acute and subacute small vessel infarcts.   OT comments  Pt. Was confused during therapy and forgot he just ate lunch. Pt. Stated he had not gotten lunch and CNA stated he just ate. Pt. Was S with unsteady gait to AMB into bathroom to perform transfer. Pt. Was able to stand at sink for grooming at S level.   Follow Up Recommendations  SNF;Home health OT    Barriers to Discharge       Equipment Recommendations       Recommendations for Other Services    Frequency Min 2X/week   Progress towards OT Goals    Plan      Precautions / Restrictions Precautions Precautions: Fall Restrictions Weight Bearing Restrictions: No   Pertinent Vitals/Pain No c/o    ADL  Grooming: Wash/dry hands;Wash/dry face;Supervision/safety Where Assessed - Grooming: Unsupported standing Lower Body Dressing: Supervision/safety Where Assessed - Lower Body Dressing: Unsupported sitting;Supported sit to stand Toilet Transfer: Min Pension scheme managerguard Toilet Transfer Method: Sit to Baristastand Toilet Transfer Equipment: Regular height toilet;Grab bars Toileting - ArchitectClothing Manipulation and Hygiene: Min guard Where Assessed - Toileting Clothing Manipulation and Hygiene: Standing ADL Comments:  (Pt. has decreased memory and will need 24 hour care at d/c.)    OT Diagnosis:    OT Problem List:   OT Treatment Interventions:     OT Goals(current goals can now be found in the care plan section)    Visit Information  Last OT Received On: 04/07/13 Assistance Needed: +1 History of Present Illness: Pt [facility]urred [facility] and [facility] side facial [facility] MRI revealed  multiple B acute and subacute small vessel infarcts.    Subjective Data      Prior Functioning       Cognition  Cognition Arousal/Alertness: Awake/alert Behavior During Therapy: Flat affect Overall Cognitive Status: No family/caregiver present to determine baseline cognitive functioning    Mobility  Transfers General transfer comment:  (S to [employer] A to AMB into bathroom.)    Exercises      Balance    End of Session OT - End of Session Activity Tolerance: Patient tolerated treatment well Patient left: in bed;with call bell/phone within reach;with bed alarm set  GO     [name] 04/07/2013, 2:16 PM

## 2013-04-08 ENCOUNTER — Inpatient Hospital Stay (HOSPITAL_COMMUNITY): Payer: Medicaid Other

## 2013-04-08 ENCOUNTER — Encounter (HOSPITAL_COMMUNITY): Payer: Self-pay | Admitting: Cardiology

## 2013-04-08 ENCOUNTER — Encounter (HOSPITAL_COMMUNITY)
Admission: EM | Disposition: A | Discharge status: Home Health | Payer: Self-pay | Source: Home / Self Care | Attending: Internal Medicine

## 2013-04-08 DIAGNOSIS — I6350 Cerebral infarction due to unspecified occlusion or stenosis of unspecified cerebral artery: Secondary | ICD-10-CM

## 2013-04-08 HISTORY — PX: LOOP RECORDER IMPLANT: SHX5954

## 2013-04-08 HISTORY — PX: LOOP RECORDER IMPLANT: SHX5477

## 2013-04-08 LAB — GLUCOSE, CAPILLARY
Glucose-Capillary: 53 mg/dL — ABNORMAL LOW (ref 70–99)
Glucose-Capillary: 59 mg/dL — ABNORMAL LOW (ref 70–99)
Glucose-Capillary: 96 mg/dL (ref 70–99)
Glucose-Capillary: 231 mg/dL — ABNORMAL HIGH (ref 70–99)
Glucose-Capillary: 404 mg/dL — ABNORMAL HIGH (ref 70–99)
Glucose-Capillary: 344 mg/dL — ABNORMAL HIGH (ref 70–99)
Glucose-Capillary: 317 mg/dL — ABNORMAL HIGH (ref 70–99)

## 2013-04-08 LAB — BASIC METABOLIC PANEL
BUN: 14 mg/dL (ref 6–23)
CALCIUM: 9.3 mg/dL (ref 8.4–10.5)
CO2: 28 mEq/L (ref 19–32)
Chloride: 103 mEq/L (ref 96–112)
Creatinine, Ser: 0.96 mg/dL (ref 0.50–1.35)
GFR, EST NON AFRICAN AMERICAN: 88 mL/min — AB (ref >90)
GLUCOSE: 142 mg/dL — AB (ref 70–99)
Potassium: 4.5 mEq/L (ref 3.7–5.3)
SODIUM: 142 meq/L (ref 137–147)

## 2013-04-08 LAB — CBC
HCT: 34.1 % — ABNORMAL LOW (ref 39.0–52.0)
HEMOGLOBIN: 11.5 g/dL — AB (ref 13.0–17.0)
MCH: 30.9 pg (ref 26.0–34.0)
MCHC: 33.7 g/dL (ref 30.0–36.0)
MCV: 91.7 fL (ref 78.0–100.0)
PLATELETS: 228 10*3/uL (ref 150–400)
RBC: 3.72 MIL/uL — AB (ref 4.22–5.81)
RDW: 12.1 % (ref 11.5–15.5)
WBC: 5.8 10*3/uL (ref 4.0–10.5)

## 2013-04-08 SURGERY — LOOP RECORDER IMPLANT
Anesthesia: LOCAL

## 2013-04-08 MED ORDER — ACETAMINOPHEN 325 MG PO TABS
325.0000 mg | ORAL_TABLET | ORAL | Status: DC | PRN
  Filled 2013-04-08: qty 2

## 2013-04-08 MED ORDER — LIDOCAINE-EPINEPHRINE 1 %-1:100000 IJ SOLN
INTRAMUSCULAR | Status: AC
Start: 2013-04-08 — End: 2013-04-08
  Filled 2013-04-08: qty 1

## 2013-04-08 MED ORDER — HEPARIN SODIUM (PORCINE) 5000 UNIT/ML IJ SOLN
5000.0000 [IU] | Freq: Three times a day (TID) | INTRAMUSCULAR | Status: DC
  Administered 2013-04-08: 5000 [IU] via SUBCUTANEOUS
  Filled 2013-04-08 (×5): qty 1

## 2013-04-08 MED ORDER — ONDANSETRON HCL 4 MG/2ML IJ SOLN: 4.0000 mg | Freq: Four times a day (QID) | INTRAMUSCULAR | Status: DC | PRN

## 2013-04-08 MED ORDER — INSULIN GLARGINE 100 UNIT/ML ~~LOC~~ SOLN
10.0000 [IU] | Freq: Every day | SUBCUTANEOUS | Status: DC
  Administered 2013-04-08: 10 [IU] via SUBCUTANEOUS
  Filled 2013-04-08 (×2): qty 0.1

## 2013-04-08 MED ORDER — GADOBENATE DIMEGLUMINE 529 MG/ML IV SOLN
15.0000 mL | Freq: Once | INTRAVENOUS | Status: AC
  Administered 2013-04-08: 15 mL via INTRAVENOUS

## 2013-04-08 MED ORDER — AMLODIPINE BESYLATE 10 MG PO TABS
10.0000 mg | ORAL_TABLET | Freq: Every day | ORAL | Status: DC
  Administered 2013-04-08 – 2013-04-09 (×2): 10 mg via ORAL
  Filled 2013-04-08 (×2): qty 1

## 2013-04-08 MED ORDER — INSULIN ASPART 100 UNIT/ML ~~LOC~~ SOLN
0.0000 [IU] | Freq: Three times a day (TID) | SUBCUTANEOUS | Status: DC
Start: 2013-04-08 — End: 2013-04-09
  Administered 2013-04-08: 9 [IU] via SUBCUTANEOUS
  Administered 2013-04-09 (×2): 7 [IU] via SUBCUTANEOUS

## 2013-04-08 NOTE — Progress Notes (Signed)
Hypoglycemic Event  CBG: 59  Treatment: 15 GM carbohydrate snack  Symptoms: Sweaty  Follow-up CBG: Time:0420 CBG Result:53 2nd followup CBG: Time:0445 CBG Result:96  Possible Reasons for Event: Medication regimen: Novolog insulin q4hrs sliding scale, Lantus insulin 15 units QHS.  Comments/MD notified:   Text page sent to Tama GanderKatherine Schorr, NP for [facility] to make aware and request diabetes coordinator consultation for management recommendations.   Manuel Higgins, Manuel Higgins  Remember to initiate Hypoglycemia Order Set & complete

## 2013-04-08 NOTE — Progress Notes (Signed)
Stroke Team Progress Note  HISTORY 63 y.o. male with a history of hypertension and diabetes mellitus presenting with new onset slurred speech and right facial droop. No history of stroke nor TIA. Patient had no extremity weakness. CT scan of the head showed no acute intracranial abnormality. NIH stroke score was 2.  Patient was not administered IV tPA secondary to mild deficits and delay and arrival.  SUBJECTIVE Patient without new complaints.  OBJECTIVE Most recent Vital Signs: Filed Vitals:   04/07/13 2156 04/08/13 0132 04/08/13 0445 04/08/13 0820  BP: 140/72 144/75 126/51 152/69  Pulse: 75 70 72 85  Temp: 98.4 F (36.9 C) 98.1 F (36.7 C) 98.4 F (36.9 C) 98 F (36.7 C)  TempSrc: Oral Oral Oral Oral  Resp: 16 17 18 18   Height:      Weight:      SpO2: 100% 99% 100% 100%   CBG (last 3)   Recent Labs  04/08/13 0418 04/08/13 0444 04/08/13 0815  GLUCAP 53* 96 231*    IV Fluid Intake:      MEDICATIONS  . aspirin  324 mg Oral Once  . aspirin  300 mg Rectal Daily   Or  . aspirin  325 mg Oral Daily  . insulin aspart  0-9 Units Subcutaneous TID WC  . insulin glargine  15 Units Subcutaneous QHS  . research study medication  75 mg Oral Q breakfast  . simvastatin  10 mg Oral q1800   PRN:  acetaminophen, acetaminophen, hydrALAZINE  Diet:  Dysphagia 2 thin liquids Activity:   Up with assistance DVT Prophylaxis:  SCDs  CLINICALLY SIGNIFICANT STUDIES Basic Metabolic Panel:   Recent Labs Lab 04/06/13 0342 04/08/13 0510  NA 138 142  K 4.1 4.5  CL 103 103  CO2 24 28  GLUCOSE 226* 142*  BUN 14 14  CREATININE 0.94 0.96  CALCIUM 9.0 9.3   Liver Function Tests:   Recent Labs Lab 04/04/13 0539  AST 15  ALT 12  ALKPHOS 152*  BILITOT 0.2*  PROT 7.3  ALBUMIN 3.5   CBC:   Recent Labs Lab 04/04/13 0539  04/06/13 0342 04/08/13 0510  WBC 7.9  --  5.9 5.8  NEUTROABS 5.5  --   --   --   HGB 13.3  < > 11.6* 11.5*  HCT 37.5*  < > 34.3* 34.1*  MCV 89.3  --   90.7 91.7  PLT 192  --  203 228  < > = values in this interval not displayed. Coagulation:   Recent Labs Lab 04/04/13 0539  LABPROT 13.2  INR 1.02   Cardiac Enzymes: No results found for this basename: CKTOTAL, CKMB, CKMBINDEX, TROPONINI,  in the last 168 hours Urinalysis:   Recent Labs Lab 04/04/13 0838  COLORURINE YELLOW  LABSPEC 1.030  PHURINE 6.0  GLUCOSEU >1000*  HGBUR NEGATIVE  BILIRUBINUR NEGATIVE  KETONESUR NEGATIVE  PROTEINUR 30*  UROBILINOGEN 0.2  NITRITE NEGATIVE  LEUKOCYTESUR NEGATIVE   Lipid Panel    Component Value Date/Time   CHOL 173 04/05/2013 0309   TRIG 113 04/05/2013 0309   HDL 49 04/05/2013 0309   CHOLHDL 3.5 04/05/2013 0309   VLDL 23 04/05/2013 0309   LDLCALC 101* 04/05/2013 0309   HgbA1C  Lab Results  Component Value Date   HGBA1C 11.4* 04/05/2013    Urine Drug Screen:     Component Value Date/Time   LABOPIA NONE DETECTED 04/04/2013 0838   COCAINSCRNUR NONE DETECTED 04/04/2013 0838   LABBENZ NONE DETECTED 04/04/2013 [id number]  AMPHETMU NONE DETECTED 04/04/2013 0838   THCU NONE DETECTED 04/04/2013 0838   LABBARB NONE DETECTED 04/04/2013 0838    Alcohol Level:   Recent Labs Lab 04/04/13 0539  ETH <11    CT of the brain 04/04/2013    No acute abnormality.  Atrophy and chronic microvascular ischemic change. Stable compared to prior exam.    MRI of the brain  04/04/2013    1. Multiple acute and subacute small vessel infarcts in the bilateral cerebral hemispheres. No associated mass effect or hemorrhage. Lesions involving the left MCA territory and right body of the corpus callosum appear to be have occurred most recently. 2. Underlying chronic severe small vessel ischemia.   MRA of the brain  04/04/2013   Extensive anterior circulation atherosclerosis, most affecting the ICA siphons. Mild to moderate right ICA stenosis.  Motion artifact affecting detail of the bilateral MCA and ACA branches, no major branch occlusion identified.     2D Echocardiogram   Left ventricle: The cavity size was normal. Wall thickness was increased in a pattern of moderate LVH. Systolic function was normal. The estimated ejection fraction was in the range of 60% to 65%. Wall motion was normal; there were no regional wall motion abnormalities. Doppler parameters are consistent with abnormal left ventricular relaxation (grade 1 diastolic dysfunction).   Carotid Doppler  No hemodynamic stenosis  TEE 04/07/2013 no PFO, no source or embolus  CXR  04/04/2013    No active cardiopulmonary disease.     EKG  Sinus rhythm   Dg Foot Complete Left  04/05/2013   Soft tissue ulceration along the medial aspect of the left great toe with erosion of the distal tuft of the first distal phalanx concerning for osteomyelitis. There is no soft tissue emphysema.    Therapy Recommendations HH PT, OT  Physical Exam   Neurologic Examination:  Mental Status:  Alert, oriented, somewhat slow to respond. Speech dysarthric without evidence of aphasia. Able to follow commands without difficulty.  Cranial Nerves:  II-Visual fields were normal.  III/IV/VI-Pupils were equal and reacted. Extraocular movements were full and conjugate.  V/VII-no facial numbness; mild right lower facial weakness.  VIII-normal.  X-mild to moderate dysarthria.  XII-midline tongue extension  Motor: 5/5 bilaterally with normal tone and bulk  Sensory: Normal throughout.  Deep Tendon Reflexes: 2+ and symmetric.  Plantars: Flexor bilaterally  Cerebellar: Normal finger-to-nose testing  ASSESSMENT [name] is a 63 y.o. male presenting with R facial droop and dysarthria. 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 Infarcts felt to be embolic secondary to unknown etiology.  TEE negative for source. On no antithrombotics prior to admission. Now enrolled in POINT study for secondary stroke prevention.    Hyperlipidemia, LDL 101, on no statin PTA, now on zocor 10 mg daily, goal LDL < 70 for  diabetics  hypertension  Diabetes, uncontrolled, HgbA1c 11.4, goal < 7.0  Cigarette smoker   Left big toe ulcer, wound care following. Orthopedic consult underway with imaging pending.  Hospital day # 4  TREATMENT/PLAN  Continue POINT study drug and ASA 325 mg daily for secondary stroke prevention. POINT is a randomized, double-blind, multicenter clinical trial to determine whether clopidogrel 75mg /day (after a loading dose of 600mg ) is effective in improving survival free from major ischemic vascular events (ischemic stroke, myocardial infarction, and ischemic vascular death) at 90 days when initiated within 12 hours time last known free of new ischemic symptoms of TIA or minor ischemic stroke in subjects receiving aspirin 50-325mg /day. Please  contact Lorenza Burton at [facility] at [phone] for any questions.    As TEE negative, a [facility] electrophysiologist will consult and consider placement of an place implantable loop recorder to evaluate for atrial fibrillation as etiology of stroke. This has been explained to patient/family by Dr. Pearlean Brownie and they are agreeable.  Home health PT and OT recommended at discharge.  Annie Main, MSN, RN, ANVP-BC, ANP-BC, [facility] Pager: [phone] 04/08/2013 10:53 AM  I have personally obtained a history, examined the patient, evaluated imaging results, and formulated the assessment and plan of care. I agree with the above.  Delia Heady, MD   To contact Stroke Continuity provider, please refer to [email or web address]. After hours, contact General Neurology

## 2013-04-08 NOTE — Progress Notes (Addendum)
Inpatient Diabetes Program Recommendations  AACE/ADA: New Consensus Statement on Inpatient Glycemic Control (2013)  Target Ranges:  Prepandial:   less than 140 mg/dL      Peak postprandial:   less than 180 mg/dL (1-2 hours)      Critically ill patients:  140 - 180 mg/dL   Reason for Visit: Results for Manuel Higgins, Keiyon (MRN [id number]) as of 04/08/2013 10:24  Ref. Range 04/07/2013 23:38 04/08/2013 03:56 04/08/2013 04:18 04/08/2013 04:44 04/08/2013 08:15  Glucose-Capillary Latest Range: 70-99 mg/dL 409239 (H) 59 (L) 53 (L) 96 231 (H)  Results for Manuel Higgins, [name] (MRN [id number]) as of 04/08/2013 10:24  Ref. Range 04/05/2013 03:29  Hemoglobin A1C Latest Range: <5.7 % 11.4 (H)   Note that patient had low CBG over night after receiving sensitive correction q 4 hours.  Consider reducing frequency of Novolog correction to tid with meals and HS (bedtime scale).  Also may consider adding Novolog meal coverage 3 units tid with meals (to be held if patient eats less than 50%).  Text page sent to Dr. Sunnie Nielsenegalado.   Thanks, Beryl MeagerJenny Mikko Lewellen, RN, BC-ADM Inpatient Diabetes Coordinator Pager [phone]  Spoke briefly to patient regarding elevated A1C.  [facility] however he does not remember his name.  He states that he has been on the 70/30 "for a while". Based on A1C, it appears he needs adjustment of home meds.  Will continue to follow.

## 2013-04-08 NOTE — Progress Notes (Signed)
Subjective: Events of day noted.   Objective: Vital signs in last 24 hours: Temp:  [98 F (36.7 C)-99.1 F (37.3 C)] 99.1 F (37.3 C) (03/24 2000) Pulse Rate:  [70-85] 84 (03/24 2000) Resp:  [16-18] 18 (03/24 2000) BP: (126-174)/(51-78) 174/68 mmHg (03/24 2000) SpO2:  [99 %-100 %] 100 % (03/24 2000)  Intake/Output from previous day: 03/23 0701 - 03/24 0700 In: 560 [P.O.:360; I.V.:200] Out: 575 [Urine:575] Intake/Output this shift: Total I/O In: -  Out: 650 [Urine:650]   Recent Labs  04/06/13 0342 04/08/13 0510  HGB 11.6* 11.5*    Recent Labs  04/06/13 0342 04/08/13 0510  WBC 5.9 5.8  RBC 3.78* 3.72*  HCT 34.3* 34.1*  PLT 203 228    Recent Labs  04/06/13 0342 04/08/13 0510  NA 138 142  K 4.1 4.5  CL 103 103  CO2 24 28  BUN 14 14  CREATININE 0.94 0.96  GLUCOSE 226* 142*  CALCIUM 9.0 9.3   No results found for this basename: LABPT, INR,  in the last 72 hours  MRI: no osteo of hallux.  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  Continue local wound care while pt is in the hospital.  Pt should f/u at the wound center for further care of this ulcer.  I'll sign off.  Please call [phone] with any questions.   Toni ArthursHEWITT, Manuel Higgins 04/08/2013, 9:35 PM

## 2013-04-08 NOTE — Consult Note (Signed)
ELECTROPHYSIOLOGY CONSULT NOTE  Patient ID: Manuel Higgins MRN: [id number], DOB/AGE: [birth year]   Admit date: 04/04/2013 Date of Consult: 04/08/2013  Primary Physician: Bernerd Limbo Primary Cardiologist: new to [facility] Reason for Consultation: Cryptogenic stroke; recommendations regarding Implantable Loop Recorder  History of Present Illness Manuel Higgins was admitted on 04/04/2013 with new onset slurred speech and right facial droop.  Imaging demonstrated bilateral anterior circulation infarcts.  He has undergone workup for stroke including echocardiogram, TEE and carotid dopplers.  The patient has been monitored on telemetry which has demonstrated sinus rhythm with no arrhythmias.    Past medical history is notable for hypertension and diabetes.   Echocardiogram this admission demonstrated EF 60-65%, no RWMA, moderate LV hypertrophy, no significant valvular abnormalities.  Lab work is remarkable for A1C of 11.4.  Prior to admission, the patient denies chest pain, shortness of breath, dizziness, palpitations, or syncope.  They are recovering from their stroke with plans to return home with home health services at discharge.  EP has been asked to evaluate for placement of an implantable loop recorder to monitor for atrial fibrillation.  ROS is negative except as outlined above.    Past Medical History  Diagnosis Date  . Hypertension   . Diabetes mellitus without complication   . Stroke      Surgical History:  Past Surgical History  Procedure Laterality Date  . Back surgery    . Tonsillectomy    . Tee without cardioversion N/A 04/07/2013    Procedure: TRANSESOPHAGEAL ECHOCARDIOGRAM (TEE);  Surgeon: Lars Masson, MD;  Location: [facility] ENDOSCOPY;  Service: Cardiovascular;  Laterality: N/A;     Prescriptions prior to admission  Medication Sig Dispense Refill  . insulin aspart protamine- aspart (NOVOLOG MIX 70/30) (70-30) 100 UNIT/ML injection Inject 20 Units into the  skin 2 (two) times daily.      Marland Kitchen amLODipine (NORVASC) 10 MG tablet Take 1 tablet (10 mg total) by mouth daily.  30 tablet  0  . lisinopril (PRINIVIL,ZESTRIL) 10 MG tablet Take 1 tablet (10 mg total) by mouth daily.  30 tablet  0  . naphazoline (NAPHCON) 0.1 % ophthalmic solution Place 1 drop into both eyes 4 (four) times daily as needed for irritation.  15 mL  0  . terbinafine (LAMISIL) 250 MG tablet Take 1 tablet (250 mg total) by mouth daily.  14 tablet  0    Inpatient Medications:  . aspirin  324 mg Oral Once  . aspirin  300 mg Rectal Daily   Or  . aspirin  325 mg Oral Daily  . insulin aspart  0-9 Units Subcutaneous TID WC  . insulin glargine  15 Units Subcutaneous QHS  . research study medication  75 mg Oral Q breakfast  . simvastatin  10 mg Oral q1800    Allergies: No Known Allergies  History   Social History  . Marital Status: Divorced    Spouse Name: N/A    Number of Children: N/A  . Years of Education: N/A   Occupational History  . Not on file.   Social History Main Topics  . Smoking status: Current Every Day Smoker -- 0.33 packs/day for 10 years    Types: Cigarettes  . Smokeless tobacco: Never Used  . Alcohol Use: No     Comment: Denies Currently but previously marked as yes  . Drug Use: No     Comment: denies hx of IVDrug use  . Sexual Activity: Not on file   Other Topics Concern  .  Not on file   Social History Narrative   Lives in [name] in [place] for past 6 weeks.  Previously homeless.   Unemployed.     No family history of early coronary disease or stroke.   Physical Exam  Well appearing NAD HEENT: Unremarkable,[place], AT Neck:  6 JVD, no thyromegally Back:  No CVA tenderness Lungs:  Clear with no wheezes, rales, or rhonchi HEART:  Regular rate rhythm, no murmurs, no rubs, no clicks Abd:  soft, positive bowel sounds, no organomegally, no rebound, no guarding Ext:  2 plus pulses, no edema, no cyanosis, no clubbing Skin:  No rashes no  nodules Neuro:  CN II through XII intact, motor grossly intact   Labs:   Lab Results  Component Value Date   WBC 5.8 04/08/2013   HGB 11.5* 04/08/2013   HCT 34.1* 04/08/2013   MCV 91.7 04/08/2013   PLT 228 04/08/2013    Recent Labs Lab 04/04/13 0539  04/08/13 0510  NA 141  < > 142  K 4.1  < > 4.5  CL 100  < > 103  CO2 25  < > 28  BUN 21  < > 14  CREATININE 1.24  < > 0.96  CALCIUM 9.7  < > 9.3  PROT 7.3  --   --   BILITOT 0.2*  --   --   ALKPHOS 152*  --   --   ALT 12  --   --   AST 15  --   --   GLUCOSE 174*  < > 142*  < > = values in this interval not displayed.   Radiology/Studies: Dg Chest 2 View 04/04/2013   CLINICAL DATA:  Stroke  EXAM: CHEST  2 VIEW  COMPARISON:  12/10/2011.  FINDINGS: Cardiomediastinal silhouette is unremarkable. No acute infiltrate or pleural effusion. No pulmonary edema. Bony thorax is unremarkable.  IMPRESSION: No active cardiopulmonary disease.   Electronically Signed   By: Natasha Mead M.D.   On: 04/04/2013 10:36   Ct Head Wo Contrast 04/04/2013   CLINICAL DATA:  Right facial droop.  EXAM: CT HEAD WITHOUT CONTRAST  TECHNIQUE: Contiguous axial images were obtained from the base of the skull through the vertex without intravenous contrast.  COMPARISON:  Head CT scan 02/15/2013.  FINDINGS: There is cortical atrophy and chronic microvascular ischemic change. No evidence of acute intracranial abnormality including infarct, hemorrhage, mass lesion, mass effect, midline shift or abnormal extra-axial fluid collection. No hydrocephalus or pneumocephalus. Calvarium intact. Imaged paranasal sinuses are clear. Very small right mastoid effusion is noted, unchanged.  IMPRESSION: No acute abnormality.  Atrophy and chronic microvascular ischemic change. Stable compared to prior exam.  Critical Value/emergent results were called by telephone at the time of interpretation on 04/04/2013 at 5:28 AM to Dr. Dierdre Highman, who verbally acknowledged these results.   Electronically Signed   By:  Drusilla Kanner M.D.   On: 04/04/2013 05:28   Mr Maxine Glenn Head Wo Contrast 04/04/2013   CLINICAL DATA:  63 year old male with hypertension and diabetes last seen normal at 2330 hr yesterday. Right facial droop and garbled speech. Initial encounter.  EXAM: MRI HEAD WITHOUT CONTRAST  MRA HEAD WITHOUT CONTRAST  TECHNIQUE: Multiplanar, multiecho pulse sequences of the brain and surrounding structures were obtained without intravenous contrast. Angiographic images of the head were obtained using MRA technique without contrast.  COMPARISON:  Head CT without contrast 04/04/2013.  FINDINGS: MRI HEAD FINDINGS  There are multiple bilateral foci of abnormal signal on the  trace diffusion images. The areas which appear to be most restricted on ADC are in the posterior left MCA territory on series 5, image 22, and the right corpus callosum on image 23. The additional areas either demonstrate isointense ADC, or facilitated diffusion (T2 shine through).  Major intracranial vascular flow voids are preserved.  Extensive chronic small vessel ischemia throughout the brain. Chronic lacunar infarcts in the left thalamus, bilateral corona radiata (including associated chronic blood products e.g. left frontal horn), right paracentral pons, and scattered in both cerebellar hemispheres. Patchy and confluent additional cerebral white matter T2 and FLAIR hyperintensity. No supratentorial cortical encephalomalacia identified. Occasional small areas of chronic blood products in the cerebral hemispheres, and multiple chronic micro hemorrhages in the brainstem and cerebellum.  No midline shift, mass effect, evidence of mass lesion, ventriculomegaly, extra-axial collection or acute intracranial hemorrhage. Cervicomedullary junction and pituitary are within normal limits. Negative visualized cervical spine. Visualized orbit soft tissues are within normal limits. Minor paranasal sinus mucosal thickening. Trace mastoid fluid and retained secretions in  the nasopharynx. Visualized scalp soft tissues are within normal limits. Visualized bone marrow signal is within normal limits.  MRA HEAD FINDINGS  Antegrade flow in the posterior circulation with dominant distal left vertebral artery. Normal left PICA origin. Dominant right AICA. Patent vertebrobasilar junction. No basilar stenosis. SCA and PCA origins are normal. Normal left posterior communicating artery, the right is diminutive or absent. Mild irregularity in the bilateral PCA P2 segments with preserved distal flow.  Antegrade flow in both ICA siphons. Bilateral cavernous and supra clinoid segment irregularity, worse on the right where there is up to mild to moderate stenosis. Carotid termini remain patent. Ophthalmic and left posterior communicating artery origins are normal.  MCA and ACA origins are patent. Motion artifact at this level degrades detail of the bilateral MCA and ACA vessels. The M1 segment on the right is within normal limits. There is irregularity of the left M1 segment (series 305, image 9) but this could be artifactual. No major MCA or ACA branch occlusion is evident.  IMPRESSION: 1. Multiple acute and subacute small vessel infarcts in the bilateral cerebral hemispheres. No associated mass effect or hemorrhage. Lesions involving the left MCA territory and right body of the corpus callosum appear to be have occurred most recently. 2. Underlying chronic severe small vessel ischemia. 3. Extensive anterior circulation atherosclerosis, most affecting the ICA siphons. Mild to moderate right ICA stenosis. 4. Motion artifact affecting detail of the bilateral MCA and ACA branches, no major branch occlusion identified.   Electronically Signed   By: Augusto GambleLee  Hall M.D.   On: 04/04/2013 14:53     12-lead ECG sinus rhythm rate 77, LVH, intervals .17/.10/.47   Telemetry sinus rhythm with no atrial arrhythmias   Assessment and Plan 1.Cryptogenic stroke 2. Near resolution of neuro deficits Rec: I have  discussed the risks/benefits/goals/expectations of insertion of an ILR for his unexplained stroke and he wishes to proceed.  Lewayne BuntingGregg Taylor, M.D.

## 2013-04-08 NOTE — Progress Notes (Signed)
PT Cancellation Note  Patient Details Name: Charlynne PanderDarrell Reece MRN: [id number] DOB: 09/08/1950   Cancelled Treatment:    Reason Eval/Treat Not Completed: Patient declined, no reason specified.  PT to check back tomorrow.    Thanks,    Rollene Rotundaebecca B. Yailyn Strack, PT, DPT [phone]   04/08/2013, 3:36 PM

## 2013-04-08 NOTE — Progress Notes (Signed)
VASCULAR LAB PRELIMINARY  ARTERIAL  ABI completed:    RIGHT    LEFT    PRESSURE WAVEFORM  PRESSURE WAVEFORM  BRACHIAL 155 Triphasic BRACHIAL 157 Triphasic  DP 137 Monophasic DP 158 Triphasic  PT 180 Biphasic PT 101 Monophasic    RIGHT LEFT  ABI 1.15 1.01   ABIs indicate normal arterial flow with Doppler waveforms indicating possible diffuse abnormalities in the right DP and Left PT.  Cristina Ceniceros, RVS 04/08/2013, 9:25 AM

## 2013-04-08 NOTE — Progress Notes (Signed)
[facility] PROGRESS NOTE  Manuel Higgins HYW:[id number] DOB: November 15, 1950 DOA: 04/04/2013 PCP: Manuel Higgins  Assessment/Plan: 1-Multiple acute and subacute small vessel infarcts in the bilateral cerebral hemispheres;  Doppler; Findings suggest 1-39% internal carotid artery stenosis bilaterally. Vertebral arteries are patent with antegrade flow. ECHO; No source of thrombus.  Continue with aspirin.  Need better diabetes controlled.  LDL 101. Started simvastatin.  TEE negative for vegetation.  Neuro following.  Plan for loop recorder.  Point Study.   2-Left big toe ulcer;  x ray: Soft tissue ulceration along the medial aspect of the left great toe with erosion of the distal tuft of the first distal phalanx concerning for osteomyelitis. There is no soft tissue emphysema. - Blood culture no growth to date.  -Wound care recommendation appreciated.  -MRI: 1. Cellulitis along the medial aspect of the left great toe. No evidence of osteomyelitis of the great toe. Mild marrow edema and T1 hypointensity in the distal fifth phalanx with enhancement on postcontrast imaging which may be secondary to trauma versus osteomyelitis. Correlate with clinical  exam.  -Awaiting Dr Doran Durand recommendation.  ESR 40.  -ABIs indicate normal arterial flow with Doppler waveforms indicating possible diffuse abnormalities in the right DP and Left PT  3-Diabetes; Continue with lantus. SSI. HB A-1c; 11.4. Hypoglycemic episode will decrease lantus  Dose.   4-Acute renal failure resolved with IV fluids. Cr peak to 1.4  5-Dysphagia; dysphagia diet.  6-Hypertension; Will resume Norvasc.   Code Status: Full Code.  Family Communication: Care discussed with patient.  Disposition Plan: Remain inpatient.    Consultants:  neurology  Procedures: ECHO;Left ventricle: The cavity size was normal. Wall thickness was increased in a pattern of moderate LVH. Systolic function was normal. The estimated ejection fraction  was in the range of 60% to 65%. Wall motion was normal; there were no regional wall motion abnormalities. Doppler parameters are consistent with abnormal left ventricular relaxation (grade 1 diastolic dysfunction).   Doppler; Findings suggest 1-39% internal carotid artery stenosis bilaterally. Vertebral arteries are patent with antegrade flow.   Antibiotics:  none  HPI/Subjective: No complaints.   Objective: Filed Vitals:   04/08/13 0820  BP: 152/69  Pulse: 85  Temp: 98 F (36.7 C)  Resp: 18    Intake/Output Summary (Last 24 hours) at 04/08/13 1232 Last data filed at 04/08/13 0816  Gross per 24 hour  Intake    720 ml  Output    350 ml  Net    370 ml   Filed Weights   04/04/13 0534 04/04/13 0922 04/05/13 0335  Weight: 78.019 kg (172 lb) 75.116 kg (165 lb 9.6 oz) 75.322 kg (166 lb 0.9 oz)    Exam:   General:  No distress.   Cardiovascular: S 1, S 2 RRR  Respiratory: CTA  Abdomen: Bs present, soft, nt  Musculoskeletal: trace edema, left big toe with dry ulceration, no drainage.   Neuro; speech less disarthric, motor strength  5/5. Right facial droop  Data Reviewed: Basic Metabolic Panel:  Recent Labs Lab 04/04/13 0539 04/04/13 0551 04/06/13 0342 04/08/13 0510  NA 141 140 138 142  K 4.1 4.3 4.1 4.5  CL 100 102 103 103  CO2 25  --  24 28  GLUCOSE 174* 169* 226* 142*  BUN 21 28* 14 14  CREATININE 1.24 1.40* 0.94 0.96  CALCIUM 9.7  --  9.0 9.3   Liver Function Tests:  Recent Labs Lab 04/04/13 0539  AST 15  ALT 12  ALKPHOS 152*  BILITOT 0.2*  PROT 7.3  ALBUMIN 3.5   No results found for this basename: LIPASE, AMYLASE,  in the last 168 hours No results found for this basename: AMMONIA,  in the last 168 hours CBC:  Recent Labs Lab 04/04/13 0539 04/04/13 0551 04/06/13 0342 04/08/13 0510  WBC 7.9  --  5.9 5.8  NEUTROABS 5.5  --   --   --   HGB 13.3 13.9 11.6* 11.5*  HCT 37.5* 41.0 34.3* 34.1*  MCV 89.3  --  90.7 91.7  PLT 192  --   203 228   Cardiac Enzymes: No results found for this basename: CKTOTAL, CKMB, CKMBINDEX, TROPONINI,  in the last 168 hours BNP (last 3 results) No results found for this basename: PROBNP,  in the last 8760 hours CBG:  Recent Labs Lab 04/07/13 2338 04/08/13 0356 04/08/13 0418 04/08/13 0444 04/08/13 0815  GLUCAP 239* 59* 53* 96 231*    Recent Results (from the past 240 hour(s))  CULTURE, BLOOD (ROUTINE X 2)     Status: None   Collection Time    04/05/13  2:47 PM      Result Value Ref Range Status   Specimen Description BLOOD RIGHT ARM   Final   Special Requests BOTTLES DRAWN AEROBIC AND ANAEROBIC 10 CC   Final   Culture  Setup Time     Final   Value: 04/05/2013 20:19     Performed at [facility]   Culture     Final   Value:        BLOOD CULTURE RECEIVED NO GROWTH TO DATE CULTURE WILL BE HELD FOR 5 DAYS BEFORE ISSUING A FINAL NEGATIVE REPORT     Performed at [facility]   Report Status PENDING   Incomplete  CULTURE, BLOOD (ROUTINE X 2)     Status: None   Collection Time    04/05/13  3:01 PM      Result Value Ref Range Status   Specimen Description BLOOD RIGHT HAND   Final   Special Requests BOTTLES DRAWN AEROBIC ONLY 10 CC   Final   Culture  Setup Time     Final   Value: 04/05/2013 20:18     Performed at [facility]   Culture     Final   Value:        BLOOD CULTURE RECEIVED NO GROWTH TO DATE CULTURE WILL BE HELD FOR 5 DAYS BEFORE ISSUING A FINAL NEGATIVE REPORT     Performed at [facility]   Report Status PENDING   Incomplete     Studies: Mr Foot Left W Wo Contrast  04/08/2013   CLINICAL DATA:  Left great toe pain.  Soft tissue ulcer.  EXAM: MRI OF THE LEFT FOREFOOT WITHOUT AND WITH CONTRAST  TECHNIQUE: Multiplanar, multisequence MR imaging was performed both before and after administration of intravenous contrast.  CONTRAST:  38m MULTIHANCE GADOBENATE DIMEGLUMINE 529 MG/ML IV SOLN  COMPARISON:  None.  FINDINGS: There is a soft tissue  ulcer with mild edema and enhancement along the medial aspect of the great toe. There is no fluid collection. There is no sinus tract.  There is no joint effusion. There is no significant marrow edema within the great toe. 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 There is mild marrow edema and T1 hypointensity in the distal fifth phalanx with enhancement on postcontrast imaging which may be secondary to trauma versus osteomyelitis. There is mild osteoarthritis of the first MTP  joint. There is hallux valgus. The visualized flexor and extensor tendons are intact. There is no muscle signal abnormality. There is mild increased signal within the plantar musculature as can be seen in a diabetic. There are no areas of abnormal enhancement.  IMPRESSION: 1. Cellulitis along the medial aspect of the left great toe. No evidence of osteomyelitis of the great toe. 2. Mild marrow edema and T1 hypointensity in the distal fifth phalanx with enhancement on postcontrast imaging which may be secondary to trauma versus osteomyelitis. Correlate with clinical exam.   Electronically Signed   By: Kathreen Devoid   On: 04/08/2013 09:38    Scheduled Meds: . aspirin  324 mg Oral Once  . aspirin  300 mg Rectal Daily   Or  . aspirin  325 mg Oral Daily  . insulin aspart  0-9 Units Subcutaneous TID WC  . insulin glargine  15 Units Subcutaneous QHS  . research study medication  75 mg Oral Q breakfast  . simvastatin  10 mg Oral q1800   Continuous Infusions:    Principal Problem:   CVA (cerebral infarction) Active Problems:   Diabetes   Acute encephalopathy   Acute renal failure   Dysphagia    Time spent: 25 minutes.     Niel Hummer A  [facility] Pager [phone]. If 7PM-7AM, please contact night-coverage at [email or web address], password [facility] 04/08/2013, 12:32 PM  LOS: 4 days

## 2013-04-08 NOTE — Interval H&P Note (Signed)
History and Physical Interval Note:  04/08/2013 2:52 PM  Manuel Higgins  has presented today for surgery, with the diagnosis of snycope  The various methods of treatment have been discussed with the patient and family. After consideration of risks, benefits and other options for treatment, the patient has consented to  Procedure(s): LOOP RECORDER IMPLANT (N/A) as a surgical intervention .  The patient's history has been reviewed, patient examined, no change in status, stable for surgery.  I have reviewed the patient's chart and labs.  Questions were answered to the patient's satisfaction.     Leonia ReevesGregg Derotha Fishbaugh,M.D.

## 2013-04-08 NOTE — CV Procedure (Signed)
Electrophysiology procedure note  Procedure: Insertion of an implantable loop recorder  Indication: Cryptogenic stroke  Description of the procedure: After informed consent was obtained, the patient was prepped in the usual manner. 20 cc of lidocaine was infiltrated into the left pectoral region. A 1 cm stab incision was carried out. The [employer] implantable loop recorder, serial number [id number] was inserted into the subcutaneous space. The R waves measured 0.8 mV. Benzoin and Steri-Strips were painted on the skin. A bandage was placed and the patient was returned to his room in satisfactory condition.  Complications: No immediate procedural complications  Conclusion: Successful insertion of an implantable loop recorder in a patient with cryptogenic stroke.  Lewayne BuntingGregg Taylor, M.D.

## 2013-04-08 NOTE — H&P (View-Only) (Signed)
ELECTROPHYSIOLOGY CONSULT NOTE  Patient ID: Manuel Higgins MRN: [id number], DOB/AGE: [birth year]   Admit date: 04/04/2013 Date of Consult: 04/08/2013  Primary Physician: Bernerd Limbo Primary Cardiologist: new to [facility] Reason for Consultation: Cryptogenic stroke; recommendations regarding Implantable Loop Recorder  History of Present Illness Manuel Higgins was admitted on 04/04/2013 with new onset slurred speech and right facial droop.  Imaging demonstrated bilateral anterior circulation infarcts.  He has undergone workup for stroke including echocardiogram, TEE and carotid dopplers.  The patient has been monitored on telemetry which has demonstrated sinus rhythm with no arrhythmias.    Past medical history is notable for hypertension and diabetes.   Echocardiogram this admission demonstrated EF 60-65%, no RWMA, moderate LV hypertrophy, no significant valvular abnormalities.  Lab work is remarkable for A1C of 11.4.  Prior to admission, the patient denies chest pain, shortness of breath, dizziness, palpitations, or syncope.  They are recovering from their stroke with plans to return home with home health services at discharge.  EP has been asked to evaluate for placement of an implantable loop recorder to monitor for atrial fibrillation.  ROS is negative except as outlined above.    Past Medical History  Diagnosis Date  . Hypertension   . Diabetes mellitus without complication   . Stroke      Surgical History:  Past Surgical History  Procedure Laterality Date  . Back surgery    . Tonsillectomy    . Tee without cardioversion N/A 04/07/2013    Procedure: TRANSESOPHAGEAL ECHOCARDIOGRAM (TEE);  Surgeon: Lars Masson, MD;  Location: [facility] ENDOSCOPY;  Service: Cardiovascular;  Laterality: N/A;     Prescriptions prior to admission  Medication Sig Dispense Refill  . insulin aspart protamine- aspart (NOVOLOG MIX 70/30) (70-30) 100 UNIT/ML injection Inject 20 Units into the  skin 2 (two) times daily.      Marland Kitchen amLODipine (NORVASC) 10 MG tablet Take 1 tablet (10 mg total) by mouth daily.  30 tablet  0  . lisinopril (PRINIVIL,ZESTRIL) 10 MG tablet Take 1 tablet (10 mg total) by mouth daily.  30 tablet  0  . naphazoline (NAPHCON) 0.1 % ophthalmic solution Place 1 drop into both eyes 4 (four) times daily as needed for irritation.  15 mL  0  . terbinafine (LAMISIL) 250 MG tablet Take 1 tablet (250 mg total) by mouth daily.  14 tablet  0    Inpatient Medications:  . aspirin  324 mg Oral Once  . aspirin  300 mg Rectal Daily   Or  . aspirin  325 mg Oral Daily  . insulin aspart  0-9 Units Subcutaneous TID WC  . insulin glargine  15 Units Subcutaneous QHS  . research study medication  75 mg Oral Q breakfast  . simvastatin  10 mg Oral q1800    Allergies: No Known Allergies  History   Social History  . Marital Status: Divorced    Spouse Name: N/A    Number of Children: N/A  . Years of Education: N/A   Occupational History  . Not on file.   Social History Main Topics  . Smoking status: Current Every Day Smoker -- 0.33 packs/day for 10 years    Types: Cigarettes  . Smokeless tobacco: Never Used  . Alcohol Use: No     Comment: Denies Currently but previously marked as yes  . Drug Use: No     Comment: denies hx of IVDrug use  . Sexual Activity: Not on file   Other Topics Concern  .  Not on file   Social History Narrative   Lives in [name] in [place] for past 6 weeks.  Previously homeless.   Unemployed.     No family history of early coronary disease or stroke.   Physical Exam  Well appearing NAD HEENT: Unremarkable,[place], AT Neck:  6 JVD, no thyromegally Back:  No CVA tenderness Lungs:  Clear with no wheezes, rales, or rhonchi HEART:  Regular rate rhythm, no murmurs, no rubs, no clicks Abd:  soft, positive bowel sounds, no organomegally, no rebound, no guarding Ext:  2 plus pulses, no edema, no cyanosis, no clubbing Skin:  No rashes no  nodules Neuro:  CN II through XII intact, motor grossly intact   Labs:   Lab Results  Component Value Date   WBC 5.8 04/08/2013   HGB 11.5* 04/08/2013   HCT 34.1* 04/08/2013   MCV 91.7 04/08/2013   PLT 228 04/08/2013    Recent Labs Lab 04/04/13 0539  04/08/13 0510  NA 141  < > 142  K 4.1  < > 4.5  CL 100  < > 103  CO2 25  < > 28  BUN 21  < > 14  CREATININE 1.24  < > 0.96  CALCIUM 9.7  < > 9.3  PROT 7.3  --   --   BILITOT 0.2*  --   --   ALKPHOS 152*  --   --   ALT 12  --   --   AST 15  --   --   GLUCOSE 174*  < > 142*  < > = values in this interval not displayed.   Radiology/Studies: Dg Chest 2 View 04/04/2013   CLINICAL DATA:  Stroke  EXAM: CHEST  2 VIEW  COMPARISON:  12/10/2011.  FINDINGS: Cardiomediastinal silhouette is unremarkable. No acute infiltrate or pleural effusion. No pulmonary edema. Bony thorax is unremarkable.  IMPRESSION: No active cardiopulmonary disease.   Electronically Signed   By: Natasha Mead M.D.   On: 04/04/2013 10:36   Ct Head Wo Contrast 04/04/2013   CLINICAL DATA:  Right facial droop.  EXAM: CT HEAD WITHOUT CONTRAST  TECHNIQUE: Contiguous axial images were obtained from the base of the skull through the vertex without intravenous contrast.  COMPARISON:  Head CT scan 02/15/2013.  FINDINGS: There is cortical atrophy and chronic microvascular ischemic change. No evidence of acute intracranial abnormality including infarct, hemorrhage, mass lesion, mass effect, midline shift or abnormal extra-axial fluid collection. No hydrocephalus or pneumocephalus. Calvarium intact. Imaged paranasal sinuses are clear. Very small right mastoid effusion is noted, unchanged.  IMPRESSION: No acute abnormality.  Atrophy and chronic microvascular ischemic change. Stable compared to prior exam.  Critical Value/emergent results were called by telephone at the time of interpretation on 04/04/2013 at 5:28 AM to Dr. Dierdre Highman, who verbally acknowledged these results.   Electronically Signed   By:  Drusilla Kanner M.D.   On: 04/04/2013 05:28   Mr Maxine Glenn Head Wo Contrast 04/04/2013   CLINICAL DATA:  63 year old male with hypertension and diabetes last seen normal at 2330 hr yesterday. Right facial droop and garbled speech. Initial encounter.  EXAM: MRI HEAD WITHOUT CONTRAST  MRA HEAD WITHOUT CONTRAST  TECHNIQUE: Multiplanar, multiecho pulse sequences of the brain and surrounding structures were obtained without intravenous contrast. Angiographic images of the head were obtained using MRA technique without contrast.  COMPARISON:  Head CT without contrast 04/04/2013.  FINDINGS: MRI HEAD FINDINGS  There are multiple bilateral foci of abnormal signal on the  trace diffusion images. The areas which appear to be most restricted on ADC are in the posterior left MCA territory on series 5, image 22, and the right corpus callosum on image 23. The additional areas either demonstrate isointense ADC, or facilitated diffusion (T2 shine through).  Major intracranial vascular flow voids are preserved.  Extensive chronic small vessel ischemia throughout the brain. Chronic lacunar infarcts in the left thalamus, bilateral corona radiata (including associated chronic blood products e.g. left frontal horn), right paracentral pons, and scattered in both cerebellar hemispheres. Patchy and confluent additional cerebral white matter T2 and FLAIR hyperintensity. No supratentorial cortical encephalomalacia identified. Occasional small areas of chronic blood products in the cerebral hemispheres, and multiple chronic micro hemorrhages in the brainstem and cerebellum.  No midline shift, mass effect, evidence of mass lesion, ventriculomegaly, extra-axial collection or acute intracranial hemorrhage. Cervicomedullary junction and pituitary are within normal limits. Negative visualized cervical spine. Visualized orbit soft tissues are within normal limits. Minor paranasal sinus mucosal thickening. Trace mastoid fluid and retained secretions in  the nasopharynx. Visualized scalp soft tissues are within normal limits. Visualized bone marrow signal is within normal limits.  MRA HEAD FINDINGS  Antegrade flow in the posterior circulation with dominant distal left vertebral artery. Normal left PICA origin. Dominant right AICA. Patent vertebrobasilar junction. No basilar stenosis. SCA and PCA origins are normal. Normal left posterior communicating artery, the right is diminutive or absent. Mild irregularity in the bilateral PCA P2 segments with preserved distal flow.  Antegrade flow in both ICA siphons. Bilateral cavernous and supra clinoid segment irregularity, worse on the right where there is up to mild to moderate stenosis. Carotid termini remain patent. Ophthalmic and left posterior communicating artery origins are normal.  MCA and ACA origins are patent. Motion artifact at this level degrades detail of the bilateral MCA and ACA vessels. The M1 segment on the right is within normal limits. There is irregularity of the left M1 segment (series 305, image 9) but this could be artifactual. No major MCA or ACA branch occlusion is evident.  IMPRESSION: 1. Multiple acute and subacute small vessel infarcts in the bilateral cerebral hemispheres. No associated mass effect or hemorrhage. Lesions involving the left MCA territory and right body of the corpus callosum appear to be have occurred most recently. 2. Underlying chronic severe small vessel ischemia. 3. Extensive anterior circulation atherosclerosis, most affecting the ICA siphons. Mild to moderate right ICA stenosis. 4. Motion artifact affecting detail of the bilateral MCA and ACA branches, no major branch occlusion identified.   Electronically Signed   By: Augusto GambleLee  Hall M.D.   On: 04/04/2013 14:53     12-lead ECG sinus rhythm rate 77, LVH, intervals .17/.10/.47   Telemetry sinus rhythm with no atrial arrhythmias   Assessment and Plan 1.Cryptogenic stroke 2. Near resolution of neuro deficits Rec: I have  discussed the risks/benefits/goals/expectations of insertion of an ILR for his unexplained stroke and he wishes to proceed.  Lewayne BuntingGregg Matty Deamer, M.D.

## 2013-04-09 LAB — GLUCOSE, CAPILLARY
Glucose-Capillary: 326 mg/dL — ABNORMAL HIGH (ref 70–99)
Glucose-Capillary: 350 mg/dL — ABNORMAL HIGH (ref 70–99)

## 2013-04-09 MED ORDER — SIMVASTATIN 10 MG PO TABS: 10.0000 mg | ORAL_TABLET | Freq: Every day | ORAL | Status: DC

## 2013-04-09 MED ORDER — DOXYCYCLINE HYCLATE 100 MG PO TABS: 100.0000 mg | ORAL_TABLET | Freq: Two times a day (BID) | ORAL | Status: DC

## 2013-04-09 MED ORDER — INSULIN GLARGINE 100 UNIT/ML ~~LOC~~ SOLN
15.0000 [IU] | Freq: Every day | SUBCUTANEOUS | Status: DC
  Filled 2013-04-09: qty 0.15

## 2013-04-09 MED ORDER — CIPROFLOXACIN HCL 500 MG PO TABS: 500.0000 mg | ORAL_TABLET | Freq: Two times a day (BID) | ORAL | Status: DC

## 2013-04-09 MED ORDER — ASPIRIN 325 MG PO TABS: 325.0000 mg | ORAL_TABLET | Freq: Every day | ORAL | Status: DC

## 2013-04-09 MED ORDER — INSULIN ASPART 100 UNIT/ML ~~LOC~~ SOLN: 0.0000 [IU] | Freq: Three times a day (TID) | SUBCUTANEOUS | Status: DC

## 2013-04-09 MED ORDER — LISINOPRIL 10 MG PO TABS: 10.0000 mg | ORAL_TABLET | Freq: Every day | ORAL | Status: DC

## 2013-04-09 MED ORDER — INSULIN GLARGINE 100 UNIT/ML ~~LOC~~ SOLN: 15.0000 [IU] | Freq: Every day | SUBCUTANEOUS | Status: DC

## 2013-04-09 MED ORDER — STUDY - INVESTIGATIONAL DRUG SIMPLE RECORD: 75.0000 mg | Freq: Every day | Status: DC

## 2013-04-09 MED ORDER — AMLODIPINE BESYLATE 10 MG PO TABS: 10.0000 mg | ORAL_TABLET | Freq: Every day | ORAL | Status: DC

## 2013-04-09 NOTE — Progress Notes (Signed)
Inpatient Diabetes Program Recommendations  AACE/ADA: New Consensus Statement on Inpatient Glycemic Control (2013)  Target Ranges:  Prepandial:   less than 140 mg/dL      Peak postprandial:   less than 180 mg/dL (1-2 hours)      Critically ill patients:  140 - 180 mg/dL   Reason for Visit: Results for Manuel Higgins, Manuel Higgins (MRN [id number]) as of 04/09/2013 10:20  Ref. Range 04/08/2013 12:52 04/08/2013 18:20 04/08/2013 21:09 04/09/2013 07:44  Glucose-Capillary Latest Range: 70-99 mg/dL 761404 (H) 950344 (H) 932317 (H) 326 (H)   Diabetes history: Diabetes Outpatient Diabetes medications: 70/30 20 units bid Current orders for Inpatient glycemic control: Lantus 10 units q HS and Novolog sensitive tid with meals  Please consider increasing Lantus to 15 units q HS and add Novolog meal coverage 3 units tid with meals.  Beryl MeagerJenny Ladarian Bonczek, RN, BC-ADM Inpatient Diabetes Coordinator Pager [phone]

## 2013-04-09 NOTE — Care Management (Signed)
[address] LewandowskyGraves-Bigelow, Shellene Sweigert Kaye, [place],BSN [phone] CM did call the [facility] for assistance with insulin. Spoke to Aliso Viejoourtney at the pharmacy and she has samples. [facility] and Darryl will pick the medications up by 6:00pm. No further needs from CM at this time. Pt has orange card appointment 04-14-13. No further needs from CM at this time.

## 2013-04-09 NOTE — Progress Notes (Signed)
Subjective: Pt doing well this AM. Pain well controlled. Pt denies N/V/F/C, chest pain, SOB, calf pain, or paresthesia b/l.  Objective: Vital signs in last 24 hours: Temp:  [98 F (36.7 C)-99.1 F (37.3 C)] 98.3 F (36.8 C) (03/25 0501) Pulse Rate:  [84-93] 93 (03/25 0501) Resp:  [18-20] 20 (03/25 0501) BP: (148-174)/(68-78) 148/77 mmHg (03/25 0501) SpO2:  [99 %-100 %] 99 % (03/25 0501) Weight:  [75.1 kg (165 lb 9.1 oz)] 75.1 kg (165 lb 9.1 oz) (03/25 0501)  Intake/Output from previous day: 03/24 0701 - 03/25 0700 In: 960 [P.O.:960] Out: 1950 [Urine:1950] Intake/Output this shift:     Recent Labs  04/08/13 0510  HGB 11.5*    Recent Labs  04/08/13 0510  WBC 5.8  RBC 3.72*  HCT 34.1*  PLT 228    Recent Labs  04/08/13 0510  NA 142  K 4.5  CL 103  CO2 28  BUN 14  CREATININE 0.96  GLUCOSE 142*  CALCIUM 9.3   No results found for this basename: LABPT, INR,  in the last 72 hours  WD WN 63y/o male in NAD, A/Ox3, appears stated age.  EOMI, mood and affect normal respirations unlabored. On physical exam left great toe presents with dry ulcer to the plantar medial surface.  No drainage, erythema, or TTP noted on exam. Small amount of localized edema to left great toe surrounding ulcer. Strength 5/5 with dorsiflexion and plantarflexion of the left great toe. DP pulses 2+b/l, normal sensation to light touch intact throughout foot with some decreased sensation to left great toe. Distal toes mobile well perfused with cap refill <2sec.   Assessment/Plan: I discussed with pt MRI findings of the left foot.  He will continue non-operative treatment for the cellulitis in the left great toe. He will also continue medical management for his other health conditions as directed by Dr. Isidoro Donningai. Pt is to f/u with wound center upon discharge for further treatment of his left great toe ulcer. Per Dr. Victorino DikeHewitt, we have signed off case.   Mirko Tailor S 04/09/2013, 7:36 AM

## 2013-04-09 NOTE — Progress Notes (Signed)
Stroke Team Progress Note  HISTORY 63 y.o. male with a history of hypertension and diabetes mellitus presenting with new onset slurred speech and right facial droop. No history of stroke nor TIA. Patient had no extremity weakness. CT scan of the head showed no acute intracranial abnormality. NIH stroke score was 2.  Patient was not administered IV tPA secondary to mild deficits and delay and arrival.  SUBJECTIVE Patient without complaints.  OBJECTIVE Most recent Vital Signs: Filed Vitals:   04/08/13 0820 04/08/13 1512 04/08/13 2000 04/09/13 0501  BP: 152/69 165/78 174/68 148/77  Pulse: 85 85 84 93  Temp: 98 F (36.7 C) 98.9 F (37.2 C) 99.1 F (37.3 C) 98.3 F (36.8 C)  TempSrc: Oral Oral Oral Oral  Resp: 18 18 18 20   Height:      Weight:    75.1 kg (165 lb 9.1 oz)  SpO2: 100% 99% 100% 99%   CBG (last 3)   Recent Labs  04/08/13 1820 04/08/13 2109 04/09/13 0744  GLUCAP 344* 317* 326*    IV Fluid Intake:      MEDICATIONS  . amLODipine  10 mg Oral Daily  . aspirin  324 mg Oral Once  . aspirin  300 mg Rectal Daily   Or  . aspirin  325 mg Oral Daily  . insulin aspart  0-9 Units Subcutaneous TID WC  . insulin glargine  10 Units Subcutaneous QHS  . research study medication  75 mg Oral Q breakfast  . simvastatin  10 mg Oral q1800   PRN:  acetaminophen, hydrALAZINE, ondansetron (ZOFRAN) IV  Diet:  Carb Control  Activity:   Up with assistance DVT Prophylaxis:  Heparin 5000 units sq tid   CLINICALLY SIGNIFICANT STUDIES Basic Metabolic Panel:   Recent Labs Lab 04/06/13 0342 04/08/13 0510  NA 138 142  K 4.1 4.5  CL 103 103  CO2 24 28  GLUCOSE 226* 142*  BUN 14 14  CREATININE 0.94 0.96  CALCIUM 9.0 9.3   Liver Function Tests:   Recent Labs Lab 04/04/13 0539  AST 15  ALT 12  ALKPHOS 152*  BILITOT 0.2*  PROT 7.3  ALBUMIN 3.5   CBC:   Recent Labs Lab 04/04/13 0539  04/06/13 0342 04/08/13 0510  WBC 7.9  --  5.9 5.8  NEUTROABS 5.5  --   --   --    HGB 13.3  < > 11.6* 11.5*  HCT 37.5*  < > 34.3* 34.1*  MCV 89.3  --  90.7 91.7  PLT 192  --  203 228  < > = values in this interval not displayed. Coagulation:   Recent Labs Lab 04/04/13 0539  LABPROT 13.2  INR 1.02   Cardiac Enzymes: No results found for this basename: CKTOTAL, CKMB, CKMBINDEX, TROPONINI,  in the last 168 hours Urinalysis:   Recent Labs Lab 04/04/13 0838  COLORURINE YELLOW  LABSPEC 1.030  PHURINE 6.0  GLUCOSEU >1000*  HGBUR NEGATIVE  BILIRUBINUR NEGATIVE  KETONESUR NEGATIVE  PROTEINUR 30*  UROBILINOGEN 0.2  NITRITE NEGATIVE  LEUKOCYTESUR NEGATIVE   Lipid Panel    Component Value Date/Time   CHOL 173 04/05/2013 0309   TRIG 113 04/05/2013 0309   HDL 49 04/05/2013 0309   CHOLHDL 3.5 04/05/2013 0309   VLDL 23 04/05/2013 0309   LDLCALC 101* 04/05/2013 0309   HgbA1C  Lab Results  Component Value Date   HGBA1C 11.4* 04/05/2013    Urine Drug Screen:     Component Value Date/Time   LABOPIA  NONE DETECTED 04/04/2013 0838   COCAINSCRNUR NONE DETECTED 04/04/2013 0838   LABBENZ NONE DETECTED 04/04/2013 0838   AMPHETMU NONE DETECTED 04/04/2013 0838   THCU NONE DETECTED 04/04/2013 0838   LABBARB NONE DETECTED 04/04/2013 0838    Alcohol Level:   Recent Labs Lab 04/04/13 0539  ETH <11    CT of the brain 04/04/2013    No acute abnormality.  Atrophy and chronic microvascular ischemic change. Stable compared to prior exam.    MRI of the brain  04/04/2013    1. Multiple acute and subacute small vessel infarcts in the bilateral cerebral hemispheres. No associated mass effect or hemorrhage. Lesions involving the left MCA territory and right body of the corpus callosum appear to be have occurred most recently. 2. Underlying chronic severe small vessel ischemia.   MRA of the brain  04/04/2013   Extensive anterior circulation atherosclerosis, most affecting the ICA siphons. Mild to moderate right ICA stenosis.  Motion artifact affecting detail of the bilateral MCA and ACA  branches, no major branch occlusion identified.     2D Echocardiogram  Left ventricle: The cavity size was normal. Wall thickness was increased in a pattern of moderate LVH. Systolic function was normal. The estimated ejection fraction was in the range of 60% to 65%. Wall motion was normal; there were no regional wall motion abnormalities. Doppler parameters are consistent with abnormal left ventricular relaxation (grade 1 diastolic dysfunction).   Carotid Doppler  No hemodynamic stenosis  TEE 04/07/2013 no PFO, no source or embolus  CXR  04/04/2013    No active cardiopulmonary disease.     EKG  Sinus rhythm   Dg Foot Complete Left  04/05/2013   Soft tissue ulceration along the medial aspect of the left great toe with erosion of the distal tuft of the first distal phalanx concerning for osteomyelitis. There is no soft tissue emphysema.    Therapy Recommendations HH PT, OT  Physical Exam   Neurologic Examination:  Mental Status:  Alert, oriented, somewhat slow to respond. Speech dysarthric without evidence of aphasia. Able to follow commands without difficulty.  Cranial Nerves:  II-Visual fields were normal.  III/IV/VI-Pupils were equal and reacted. Extraocular movements were full and conjugate.  V/VII-no facial numbness; mild right lower facial weakness.  VIII-normal.  X-mild to moderate dysarthria.  XII-midline tongue extension  Motor: 5/5 bilaterally with normal tone and bulk  Sensory: Normal throughout.  Deep Tendon Reflexes: 2+ and symmetric.  Plantars: Flexor bilaterally  Cerebellar: Normal finger-to-nose testing  ASSESSMENT [name] is a 63 y.o. male presenting with R facial droop and dysarthria. 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 Infarcts felt to be embolic secondary to unknown etiology.  TEE negative for source. Loop recorder placed to assess for atrial fibrillation as cause of stroke. On no antithrombotics prior to admission. Now enrolled in  POINT study for secondary stroke prevention.    Hyperlipidemia, LDL 101, on no statin PTA, now on zocor 10 mg daily, goal LDL < 70 for diabetics  hypertension  Diabetes, uncontrolled, HgbA1c 11.4, goal < 7.0  Cigarette smoker   Left big toe ulcer, cellulitis, wound center to follow at d/c. Orthopedic consult has signed off.   Hospital day # 5  TREATMENT/PLAN  Continue POINT study drug and ASA 325 mg daily for secondary stroke prevention. POINT is a randomized, double-blind, multicenter clinical trial to determine whether clopidogrel 75mg /day (after a loading dose of 600mg ) is effective in improving survival free from major ischemic vascular events (  ischemic stroke, myocardial infarction, and ischemic vascular death) at 90 days when initiated within 12 hours time last known free of new ischemic symptoms of TIA or minor ischemic stroke in subjects receiving aspirin 50-325mg /day. Please contact Lorenza BurtonWes Harbison at [facility] at [phone] for any questions.  [facility]  Dispense POINT study drug home with pt. I wrote an order for the nurse to get from the main pharmacy.  Home health PT and OT recommended at discharge.  No further stroke workup indicated.  Patient has a 10-15% risk of having another stroke over the next year, the highest risk is within 2 weeks of the most recent stroke/TIA (risk of having a stroke following a stroke or TIA is the same).  Ongoing risk factor control by Primary Care Physician  Stroke Service will sign off. Please call should any needs arise.  Follow up with Dr. Pearlean BrownieSethi, Stroke Clinic, in 2 months.   Annie MainSHARON BIBY, MSN, RN, ANVP-BC, ANP-BC, Lawernce IonGNP-BC [facility] Pager: [phone] 04/09/2013 9:41 AM  I have personally obtained a history, examined the patient, evaluated imaging results, and formulated the assessment and plan of care. I agree with the above. Delia HeadyPramod Sethi,  MD   To contact Stroke Continuity provider, please refer to [email or web address]. After hours, contact General Neurology

## 2013-04-09 NOTE — Discharge Summary (Signed)
Physician Discharge Summary  Patient ID: Charlynne PanderDarrell King MRN: [id number] DOB/AGE: 61/06/1950 61 y.o.  Admit date: 04/04/2013 Discharge date: 04/09/2013  Primary Care Physician:  Bernerd LimboMITCHELL, [name]  Discharge Diagnoses:    . acute CVA  . left toe ulcer  . Diabetes . Acute encephalopathy . Acute renal failure . Dysphagia  Consults: Neurology/stroke service                   Orthopedics                   Cardiology/EP service   Recommendations for Outpatient Follow-up:   Please make an appointment with wound care center in 10 days for followup  Wound care:  Wound care: Wound care to left great toe (hallux), medial aspect:  Cleanse with NS, pat gently dry.  Apply a saline moistened 2x2 inch gauze (opened) to ulcer, top with dry gauze and secure with wrap/tape.  Change twice daily.   Please check CBGs/blood sugars at least 3 times daily with meals. Keep a log of the blood sugars and bring it to your doctor's appointments.  Allergies:  No Known Allergies   Discharge Medications:   Medication List    STOP taking these medications       insulin aspart protamine- aspart (70-30) 100 UNIT/ML injection  Commonly known as:  NOVOLOG MIX 70/30      TAKE these medications       amLODipine 10 MG tablet  Commonly known as:  NORVASC  Take 1 tablet (10 mg total) by mouth daily.     aspirin 325 MG tablet  Take 1 tablet (325 mg total) by mouth daily.     ciprofloxacin 500 MG tablet  Commonly known as:  CIPRO  Take 1 tablet (500 mg total) by mouth 2 (two) times daily. X 2 weeks     doxycycline 100 MG tablet  Commonly known as:  VIBRA-TABS  Take 1 tablet (100 mg total) by mouth 2 (two) times daily. X 2 weeks     insulin aspart 100 UNIT/ML injection  Commonly known as:  novoLOG  Inject 0-9 Units into the skin 3 (three) times daily with meals. Sliding scale CBG 70 - 120: 0 units CBG 121 - 150: 1 unit,  CBG 151 - 200: 2 units,  CBG 201 - 250: 3 units,  CBG 251 - 300: 5 units,  CBG 301 -  350: 7 units,  CBG 351 - 400: 9 units   CBG > 400: 9 units and notify your MD     insulin glargine 100 UNIT/ML injection  Commonly known as:  LANTUS  Inject 0.15 mLs (15 Units total) into the skin at bedtime.     lisinopril 10 MG tablet  Commonly known as:  PRINIVIL,ZESTRIL  Take 1 tablet (10 mg total) by mouth daily.     naphazoline 0.1 % ophthalmic solution  Commonly known as:  NAPHCON  Place 1 drop into both eyes 4 (four) times daily as needed for irritation.     research study medication  Take 75 mg by mouth daily with breakfast.     simvastatin 10 MG tablet  Commonly known as:  ZOCOR  Take 1 tablet (10 mg total) by mouth at bedtime.     terbinafine 250 MG tablet  Commonly known as:  LAMISIL  Take 1 tablet (250 mg total) by mouth daily.         Brief H and P: For complete details please refer to admission H  and P, but in brief Patient is a 63 year old male with with history of hypertension, insulin-dependent diabetes mellitus, who is currently at resident of [facility] (? Group home) was seen loss normal at 11:30 p.m. at night before going to bed. On the morning of admission, the [facility] staff noted right-sided facial drooping and garbled speech this morning, at 5:15 AM when he woke up. The patient still had garbled speech and was not able to provide a clear history himself. He denied any prior history of stroke.  Neurology was consulted in the ER, patient was not consider TPA candidate due to late presentation. Patient was recommended admission for stroke workup.   Hospital Course:  Patient is a 63 y.o. male with a history of hypertension and diabetes mellitus who presented with  new onset slurred speech and right facial droop. No history of stroke nor TIA. Patient had no extremity weakness.  CT scan of the head showed no acute intracranial abnormality. NIH stroke score was 2. Patient was not consider TPA candidate secondary to delayed presentation.     1-Multiple acute and subacute small vessel infarcts in the bilateral cerebral hemispheres; patient underwent full stroke workup. MRI of the brain showed multiple acute and subacute small vessel infarcts in the bilateral cerebral hemispheres. MRA brain showed extensive anterior circulation arthrosclerosis most affecting the ICA siphons. Mild to moderate right ICA stenosis Carotid Dopplers showed 1-39% internal carotid artery stenosis bilaterally. Vertebral arteries are patent with antegrade flow. 2-D echo showed EF of 60 still 65%, grade 1 diastolic dysfunction, no source of thrombus Neurology/stroke service followed the patient and recommended aspirin 325 mg daily and enrolled in POINT TRIAL Per stroke service : ( POINT is a randomized, double-blind, multicenter clinical trial to determine whether clopidogrel 75mg /day (after a loading dose of 600mg ) is effective in improving survival free from major ischemic vascular events (ischemic stroke, myocardial infarction, and ischemic vascular death) at 90 days when initiated within 12 hours time last known free of new ischemic symptoms of TIA or minor ischemic stroke in subjects receiving aspirin 50-325mg /day) LDL 101. Started simvastatin.  TEE negative for vegetation.  Cardiology was consulted and implantable loop recorder was placed on 04/08/13  2-Left big toe ulcer; x ray: Soft tissue ulceration along the medial aspect of the left great toe with erosion of the distal tuft of the first distal phalanx concerning for osteomyelitis. There is no soft tissue emphysema.  Blood culture no growth to date. Wound care recommendation appreciated.  -MRI: 1. Cellulitis along the medial aspect of the left great toe. No evidence of osteomyelitis of the great toe. Mild marrow edema and T1 hypointensity in the distal fifth phalanx with enhancement on postcontrast imaging which may be secondary to trauma versus osteomyelitis. Correlate with clinical  exam.  -ABIs indicate  normal arterial flow with Doppler waveforms indicating possible diffuse abnormalities in the right DP and Left PT  Orthopedics was consulted and recommended local ice wound care and treating cellulitis. Please make the followup appointment with the wound care center .  3-Diabetes; Continue with lantus. SSI. HB A-1c; 11.4. Patient was placed on Lantus, per diabetic coordinator, 15 units at bedtime and sliding scale insulin. He will need to be closely monitored by  primary physician  4-Acute renal failure resolved with IV fluids. Cr peak to 1.4   5-Dysphagia; currently resolved  6-Hypertension; Will resume Norvasc.     Day of Discharge BP 148/77  Pulse 93  Temp(Src) 98.3 F (36.8 C) (Oral)  Resp 20  Ht 5\' 9"  (1.753 m)  Wt 75.1 kg (165 lb 9.1 oz)  BMI 24.44 kg/m2  SpO2 99%  Physical Exam: General: Alert and awake oriented x3 not in any acute distress. CVS: S1-S2 clear regular rate and rhythm Chest clear to auscultation bilaterally Abdomen soft nontender nondistended normal bowel sounds Extremities trace edema, left big toe with dry ulceration no drainage Neuro: Motor strength 5 x 5, right facial droop, speech less dysarthria     The results of significant diagnostics from this hospitalization (including imaging, microbiology, ancillary and laboratory) are listed below for reference.    LAB RESULTS: Basic Metabolic Panel:  Recent Labs Lab 04/06/13 0342 04/08/13 0510  NA 138 142  K 4.1 4.5  CL 103 103  CO2 24 28  GLUCOSE 226* 142*  BUN 14 14  CREATININE 0.94 0.96  CALCIUM 9.0 9.3   Liver Function Tests:  Recent Labs Lab 04/04/13 0539  AST 15  ALT 12  ALKPHOS 152*  BILITOT 0.2*  PROT 7.3  ALBUMIN 3.5   No results found for this basename: LIPASE, AMYLASE,  in the last 168 hours No results found for this basename: AMMONIA,  in the last 168 hours CBC:  Recent Labs Lab 04/04/13 0539  04/06/13 0342 04/08/13 0510  WBC 7.9  --  5.9 5.8  NEUTROABS 5.5  --    --   --   HGB 13.3  < > 11.6* 11.5*  HCT 37.5*  < > 34.3* 34.1*  MCV 89.3  --  90.7 91.7  PLT 192  --  203 228  < > = values in this interval not displayed. Cardiac Enzymes: No results found for this basename: CKTOTAL, CKMB, CKMBINDEX, TROPONINI,  in the last 168 hours BNP: No components found with this basename: POCBNP,  CBG:  Recent Labs Lab 04/09/13 0744 04/09/13 1156  GLUCAP 326* 350*    Significant Diagnostic Studies:  Dg Chest 2 View  04/04/2013   CLINICAL DATA:  Stroke  EXAM: CHEST  2 VIEW  COMPARISON:  12/10/2011.  FINDINGS: Cardiomediastinal silhouette is unremarkable. No acute infiltrate or pleural effusion. No pulmonary edema. Bony thorax is unremarkable.  IMPRESSION: No active cardiopulmonary disease.   Electronically Signed   By: Natasha Mead M.D.   On: 04/04/2013 10:36   Ct Head Wo Contrast  04/04/2013   CLINICAL DATA:  Right facial droop.  EXAM: CT HEAD WITHOUT CONTRAST  TECHNIQUE: Contiguous axial images were obtained from the base of the skull through the vertex without intravenous contrast.  COMPARISON:  Head CT scan 02/15/2013.  FINDINGS: There is cortical atrophy and chronic microvascular ischemic change. No evidence of acute intracranial abnormality including infarct, hemorrhage, mass lesion, mass effect, midline shift or abnormal extra-axial fluid collection. No hydrocephalus or pneumocephalus. Calvarium intact. Imaged paranasal sinuses are clear. Very small right mastoid effusion is noted, unchanged.  IMPRESSION: No acute abnormality.  Atrophy and chronic microvascular ischemic change. Stable compared to prior exam.  Critical Value/emergent results were called by telephone at the time of interpretation on 04/04/2013 at 5:28 AM to Dr. Dierdre Highman, who verbally acknowledged these results.   Electronically Signed   By: Drusilla Kanner M.D.   On: 04/04/2013 05:28   Mr Maxine Glenn Head Wo Contrast  04/04/2013   CLINICAL DATA:  63 year old male with hypertension and diabetes last seen  normal at 2330 hr yesterday. Right facial droop and garbled speech. Initial encounter.  EXAM: MRI HEAD WITHOUT CONTRAST  MRA HEAD WITHOUT CONTRAST  TECHNIQUE: Multiplanar,  multiecho pulse sequences of the brain and surrounding structures were obtained without intravenous contrast. Angiographic images of the head were obtained using MRA technique without contrast.  COMPARISON:  Head CT without contrast 04/04/2013.  FINDINGS: MRI HEAD FINDINGS  There are multiple bilateral foci of abnormal signal on the trace diffusion images. The areas which appear to be most restricted on ADC are in the posterior left MCA territory on series 5, image 22, and the right corpus callosum on image 23. The additional areas either demonstrate isointense ADC, or facilitated diffusion (T2 shine through).  Major intracranial vascular flow voids are preserved.  Extensive chronic small vessel ischemia throughout the brain. Chronic lacunar infarcts in the left thalamus, bilateral corona radiata (including associated chronic blood products e.g. left frontal horn), right paracentral pons, and scattered in both cerebellar hemispheres. Patchy and confluent additional cerebral white matter T2 and FLAIR hyperintensity. No supratentorial cortical encephalomalacia identified. Occasional small areas of chronic blood products in the cerebral hemispheres, and multiple chronic micro hemorrhages in the brainstem and cerebellum.  No midline shift, mass effect, evidence of mass lesion, ventriculomegaly, extra-axial collection or acute intracranial hemorrhage. Cervicomedullary junction and pituitary are within normal limits. Negative visualized cervical spine. Visualized orbit soft tissues are within normal limits. Minor paranasal sinus mucosal thickening. Trace mastoid fluid and retained secretions in the nasopharynx. Visualized scalp soft tissues are within normal limits. Visualized bone marrow signal is within normal limits.  MRA HEAD FINDINGS  Antegrade  flow in the posterior circulation with dominant distal left vertebral artery. Normal left PICA origin. Dominant right AICA. Patent vertebrobasilar junction. No basilar stenosis. SCA and PCA origins are normal. Normal left posterior communicating artery, the right is diminutive or absent. Mild irregularity in the bilateral PCA P2 segments with preserved distal flow.  Antegrade flow in both ICA siphons. Bilateral cavernous and supra clinoid segment irregularity, worse on the right where there is up to mild to moderate stenosis. Carotid termini remain patent. Ophthalmic and left posterior communicating artery origins are normal.  MCA and ACA origins are patent. Motion artifact at this level degrades detail of the bilateral MCA and ACA vessels. The M1 segment on the right is within normal limits. There is irregularity of the left M1 segment (series 305, image 9) but this could be artifactual. No major MCA or ACA branch occlusion is evident.  IMPRESSION: 1. Multiple acute and subacute small vessel infarcts in the bilateral cerebral hemispheres. No associated mass effect or hemorrhage. Lesions involving the left MCA territory and right body of the corpus callosum appear to be have occurred most recently. 2. Underlying chronic severe small vessel ischemia. 3. Extensive anterior circulation atherosclerosis, most affecting the ICA siphons. Mild to moderate right ICA stenosis. 4. Motion artifact affecting detail of the bilateral MCA and ACA branches, no major branch occlusion identified.   Electronically Signed   By: Augusto Gamble M.D.   On: 04/04/2013 14:53   Mr Brain Wo Contrast  04/04/2013   CLINICAL DATA:  63 year old male with hypertension and diabetes last seen normal at 2330 hr yesterday. Right facial droop and garbled speech. Initial encounter.  EXAM: MRI HEAD WITHOUT CONTRAST  MRA HEAD WITHOUT CONTRAST  TECHNIQUE: Multiplanar, multiecho pulse sequences of the brain and surrounding structures were obtained without  intravenous contrast. Angiographic images of the head were obtained using MRA technique without contrast.  COMPARISON:  Head CT without contrast 04/04/2013.  FINDINGS: MRI HEAD FINDINGS  There are multiple bilateral foci of abnormal signal on the trace diffusion  images. The areas which appear to be most restricted on ADC are in the posterior left MCA territory on series 5, image 22, and the right corpus callosum on image 23. The additional areas either demonstrate isointense ADC, or facilitated diffusion (T2 shine through).  Major intracranial vascular flow voids are preserved.  Extensive chronic small vessel ischemia throughout the brain. Chronic lacunar infarcts in the left thalamus, bilateral corona radiata (including associated chronic blood products e.g. left frontal horn), right paracentral pons, and scattered in both cerebellar hemispheres. Patchy and confluent additional cerebral white matter T2 and FLAIR hyperintensity. No supratentorial cortical encephalomalacia identified. Occasional small areas of chronic blood products in the cerebral hemispheres, and multiple chronic micro hemorrhages in the brainstem and cerebellum.  No midline shift, mass effect, evidence of mass lesion, ventriculomegaly, extra-axial collection or acute intracranial hemorrhage. Cervicomedullary junction and pituitary are within normal limits. Negative visualized cervical spine. Visualized orbit soft tissues are within normal limits. Minor paranasal sinus mucosal thickening. Trace mastoid fluid and retained secretions in the nasopharynx. Visualized scalp soft tissues are within normal limits. Visualized bone marrow signal is within normal limits.  MRA HEAD FINDINGS  Antegrade flow in the posterior circulation with dominant distal left vertebral artery. Normal left PICA origin. Dominant right AICA. Patent vertebrobasilar junction. No basilar stenosis. SCA and PCA origins are normal. Normal left posterior communicating artery, the right  is diminutive or absent. Mild irregularity in the bilateral PCA P2 segments with preserved distal flow.  Antegrade flow in both ICA siphons. Bilateral cavernous and supra clinoid segment irregularity, worse on the right where there is up to mild to moderate stenosis. Carotid termini remain patent. Ophthalmic and left posterior communicating artery origins are normal.  MCA and ACA origins are patent. Motion artifact at this level degrades detail of the bilateral MCA and ACA vessels. The M1 segment on the right is within normal limits. There is irregularity of the left M1 segment (series 305, image 9) but this could be artifactual. No major MCA or ACA branch occlusion is evident.  IMPRESSION: 1. Multiple acute and subacute small vessel infarcts in the bilateral cerebral hemispheres. No associated mass effect or hemorrhage. Lesions involving the left MCA territory and right body of the corpus callosum appear to be have occurred most recently. 2. Underlying chronic severe small vessel ischemia. 3. Extensive anterior circulation atherosclerosis, most affecting the ICA siphons. Mild to moderate right ICA stenosis. 4. Motion artifact affecting detail of the bilateral MCA and ACA branches, no major branch occlusion identified.   Electronically Signed   By: Augusto Gamble M.D.   On: 04/04/2013 14:53   Dg Foot Complete Left  04/05/2013   CLINICAL DATA:  Ulceration of the left great toe  EXAM: LEFT FOOT - COMPLETE 3+ VIEW  COMPARISON:  DG TOES 2+V*L* dated 10/07/2012  FINDINGS: There is no acute fracture or dislocation. There is is soft tissue ulcer along medial aspect of the left great toe. There is erosion of the distal tuft of the first distal phalanx concerning for osteomyelitis. There is no soft tissue emphysema. There are mild osteoarthritic changes of the first MTP joint. There is no acute fracture or dislocation.  IMPRESSION: Soft tissue ulceration along the medial aspect of the left great toe with erosion of the distal  tuft of the first distal phalanx concerning for osteomyelitis. There is no soft tissue emphysema.   Electronically Signed   By: Elige Ko   On: 04/05/2013 17:31    2D ECHO: Study Conclusions  transsophageal echo   - Left ventricle: Systolic function was normal. The estimated ejection fraction was in the range of 60% to 65%. Wall motion was normal; there were no regional wall motion abnormalities. - Mitral valve: Mild regurgitation. - Left atrium: No evidence of thrombus in the atrial cavity or appendage. No evidence of thrombus in the atrial cavity or appendage. - Right atrium: No evidence of thrombus in the atrial cavity or appendage. - Atrial septum: No defect or patent foramen ovale was identified. Impressions:  - No cardiac source of emboli was indentified.    Disposition and Follow-up:     Discharge Orders   Future Appointments Provider Department Dept Phone   04/14/2013 9:30 AM [facility] Financial Counselor [facility] [phone]   04/17/2013 11:30 AM [facility] [employer] [phone]   Future Orders Complete By Expires   Diet Carb Modified  As directed    Discharge instructions  As directed    Comments:     Wound care: Wound care to left great toe (hallux), medial aspect:  Cleanse with NS, pat gently dry.  Apply a saline moistened 2x2 inch gauze (opened) to ulcer, top with dry gauze and secure with wrap/tape.  Change twice daily.   Increase activity slowly  As directed        DISPOSITION: Group home DIET: Carb modified diet   DISCHARGE FOLLOW-UP Follow-up Information   Follow up with [facility]. (Registered Nurse, Physical and Occupational Therapy)    Contact information:   [address] [place] [address] [phone]       Follow up with Gates Rigg, MD. Schedule an appointment as soon as possible for a visit in 2 months. (Stroke Clinic)    Specialties:   Neurology, Radiology   Contact information:   [address] [place] [address] [phone]       Follow up with [facility]              In 10 days. (for foot wound , for hospital follow-up)    Contact information:   [address] [place] [address] [phone]      Follow up with Lewayne Bunting, MD On 04/17/2013. (at 11:30AM)    Specialty:  Cardiology   Contact information:   [address] [place] [address] [phone]       Follow up with [facility]    . Schedule an appointment as soon as possible for a visit in 2 weeks. (for hospital follow-up)    Contact information:   [address] [place] [address] [phone]      Time spent on Discharge: 45 mins  Signed:   RAI,RIPUDEEP M.D. [facility] 04/09/2013, 12:34 PM Pager: [phone]

## 2013-04-09 NOTE — Progress Notes (Signed)
PT Cancellation Note  Patient Details Name: Manuel Higgins MRN: [id number] DOB: 05/13/1950   Cancelled Treatment:    Reason Eval/Treat Not Completed: Other (comment) (Artistfinancial counselor in room)   Lurena Joinerebecca B. Tiarna Koppen, PT, DPT [phone]   04/09/2013, 11:44 AM

## 2013-04-09 NOTE — Progress Notes (Signed)
[facility] in [facility]/t patient returning back there to day. Also went over discharge instructions with Mr. Manuel MohHouston d/t this nurse feeling as if  Patient would not understand all the information involved in the discharge instructions.  This nurse will call [employer] when ready to discharge patient.

## 2013-04-09 NOTE — Progress Notes (Signed)
PT Cancellation Note  Patient Details Name: Manuel PanderDarrell Miyasato MRN: [id number] DOB: 04/29/1950   Cancelled Treatment:    Reason Eval/Treat Not Completed: Patient declined, no reason specified .  "I don't want to move.  I want to lay here and wait for my lunch."  If pt refuses again tomorrow PT to sign off per departmental policy.   Thanks,    Rollene Rotundaebecca B. Janele Lague, PT, DPT [phone]   04/09/2013, 11:52 AM

## 2013-04-11 LAB — CULTURE, BLOOD (ROUTINE X 2)
CULTURE: NO GROWTH
Culture: NO GROWTH

## 2013-04-14 ENCOUNTER — Encounter (HOSPITAL_COMMUNITY): Payer: Self-pay | Admitting: Emergency Medicine

## 2013-04-14 ENCOUNTER — Ambulatory Visit: Payer: Self-pay

## 2013-04-14 ENCOUNTER — Emergency Department (HOSPITAL_COMMUNITY)
Admission: EM | Admit: 2013-04-14 | Discharge: 2013-04-14 | Disposition: A | Discharge status: Home / Self Care | Payer: Medicaid Other | Attending: Emergency Medicine | Admitting: Emergency Medicine

## 2013-04-14 DIAGNOSIS — Z794 Long term (current) use of insulin: Secondary | ICD-10-CM | POA: Diagnosis not present

## 2013-04-14 DIAGNOSIS — Z79899 Other long term (current) drug therapy: Secondary | ICD-10-CM | POA: Diagnosis not present

## 2013-04-14 DIAGNOSIS — R739 Hyperglycemia, unspecified: Secondary | ICD-10-CM

## 2013-04-14 DIAGNOSIS — Z7982 Long term (current) use of aspirin: Secondary | ICD-10-CM | POA: Insufficient documentation

## 2013-04-14 DIAGNOSIS — Z8673 Personal history of transient ischemic attack (TIA), and cerebral infarction without residual deficits: Secondary | ICD-10-CM | POA: Diagnosis not present

## 2013-04-14 DIAGNOSIS — F17200 Nicotine dependence, unspecified, uncomplicated: Secondary | ICD-10-CM | POA: Insufficient documentation

## 2013-04-14 DIAGNOSIS — E119 Type 2 diabetes mellitus without complications: Secondary | ICD-10-CM | POA: Insufficient documentation

## 2013-04-14 DIAGNOSIS — I10 Essential (primary) hypertension: Secondary | ICD-10-CM | POA: Diagnosis not present

## 2013-04-14 LAB — CBC WITH DIFFERENTIAL/PLATELET
BASOS ABS: 0 10*3/uL (ref 0.0–0.1)
BASOS PCT: 0 % (ref 0–1)
EOS ABS: 0.1 10*3/uL (ref 0.0–0.7)
EOS PCT: 2 % (ref 0–5)
HEMATOCRIT: 35.1 % — AB (ref 39.0–52.0)
Hemoglobin: 12 g/dL — ABNORMAL LOW (ref 13.0–17.0)
LYMPHS PCT: 19 % (ref 12–46)
Lymphs Abs: 1.4 10*3/uL (ref 0.7–4.0)
MCH: 31.6 pg (ref 26.0–34.0)
MCHC: 34.2 g/dL (ref 30.0–36.0)
MCV: 92.4 fL (ref 78.0–100.0)
MONO ABS: 0.5 10*3/uL (ref 0.1–1.0)
Monocytes Relative: 7 % (ref 3–12)
Neutro Abs: 5.3 10*3/uL (ref 1.7–7.7)
Neutrophils Relative %: 72 % (ref 43–77)
PLATELETS: 260 10*3/uL (ref 150–400)
RBC: 3.8 MIL/uL — ABNORMAL LOW (ref 4.22–5.81)
RDW: 12.3 % (ref 11.5–15.5)
WBC: 7.3 10*3/uL (ref 4.0–10.5)

## 2013-04-14 LAB — I-STAT CHEM 8, ED
BUN: 31 mg/dL — AB (ref 6–23)
CALCIUM ION: 1.17 mmol/L (ref 1.13–1.30)
Chloride: 96 mEq/L (ref 96–112)
Creatinine, Ser: 1.4 mg/dL — ABNORMAL HIGH (ref 0.50–1.35)
Glucose, Bld: 503 mg/dL — ABNORMAL HIGH (ref 70–99)
HCT: 37 % — ABNORMAL LOW (ref 39.0–52.0)
HEMOGLOBIN: 12.6 g/dL — AB (ref 13.0–17.0)
Potassium: 5 mEq/L (ref 3.7–5.3)
Sodium: 134 mEq/L — ABNORMAL LOW (ref 137–147)
TCO2: 28 mmol/L (ref 0–100)

## 2013-04-14 LAB — URINALYSIS, ROUTINE W REFLEX MICROSCOPIC
Bilirubin Urine: NEGATIVE
Glucose, UA: >1000 mg/dL — AB
Hgb urine dipstick: NEGATIVE
Ketones, ur: NEGATIVE mg/dL
LEUKOCYTES UA: NEGATIVE
Nitrite: NEGATIVE
PROTEIN: NEGATIVE mg/dL
Specific Gravity, Urine: 1.029 (ref 1.005–1.030)
Urobilinogen, UA: 0.2 mg/dL (ref 0.0–1.0)
pH: 6 (ref 5.0–8.0)

## 2013-04-14 LAB — URINE MICROSCOPIC-ADD ON

## 2013-04-14 LAB — CBG MONITORING, ED
GLUCOSE-CAPILLARY: 306 mg/dL — AB (ref 70–99)
Glucose-Capillary: 486 mg/dL — ABNORMAL HIGH (ref 70–99)
Glucose-Capillary: 346 mg/dL — ABNORMAL HIGH (ref 70–99)

## 2013-04-14 MED ORDER — INSULIN ASPART 100 UNIT/ML ~~LOC~~ SOLN
10.0000 [IU] | Freq: Once | SUBCUTANEOUS | Status: AC
  Administered 2013-04-14: 10 [IU] via INTRAVENOUS
  Filled 2013-04-14: qty 1

## 2013-04-14 MED ORDER — SODIUM CHLORIDE 0.9 % IV BOLUS (SEPSIS)
1000.0000 mL | Freq: Once | INTRAVENOUS | Status: AC
  Administered 2013-04-14: 1000 mL via INTRAVENOUS

## 2013-04-14 NOTE — Discharge Planning (Signed)
[facility] Felicia E, [employer] Liaison  Spoke to patient about primary care resources and establishing care with a provider. Patient states he is followed and [facility] Dr[facility][name]. Patient states he does not have the orange card with this practice but that option has been suggested to him. I gave the patient an orange card application and reccommended that he take it to his practice to get a card. My contact information was also provided for any future questions or concerns.

## 2013-04-14 NOTE — Discharge Instructions (Signed)

## 2013-04-14 NOTE — ED Provider Notes (Signed)
CSN: [id number]     Arrival date & time 04/14/13  4098 History   First MD Initiated Contact with Patient 04/14/13 [phone]     Chief Complaint  Patient presents with  . Hyperglycemia     (Consider location/radiation/quality/duration/timing/severity/associated sxs/prior Treatment) Patient is a 63 y.o. male presenting with hyperglycemia. The history is provided by the patient.  Hyperglycemia Blood sugar level PTA:  >400 Severity:  Moderate Onset quality:  Gradual Timing:  Constant Progression:  Unchanged Chronicity:  Chronic Diabetes status:  Controlled with insulin Current diabetic therapy:  Insulin - lantus and 70/30 Time since last antidiabetic medication:  1 hour Context: not new diabetes diagnosis and not noncompliance   Relieved by:  Nothing Ineffective treatments:  Insulin Associated symptoms: no abdominal pain, no altered mental status, no dysuria, no increased appetite, no increased thirst, no nausea, no polyuria, no shortness of breath, no vomiting and no weakness   Risk factors: no hx of DKA and no recent steroid use     Past Medical History  Diagnosis Date  . Hypertension   . Diabetes mellitus without complication   . Stroke    Past Surgical History  Procedure Laterality Date  . Back surgery    . Tonsillectomy    . Tee without cardioversion N/A 04/07/2013    Procedure: TRANSESOPHAGEAL ECHOCARDIOGRAM (TEE);  Surgeon: Lars Masson, MD;  Location: [facility] ENDOSCOPY;  Service: Cardiovascular;  Laterality: N/A;  . Loop recorder implant  04/08/2013    MDT LinQ implanted by Dr Ladona Ridgel for cryptogenic stroke   History reviewed. No pertinent family history. History  Substance Use Topics  . Smoking status: Current Every Day Smoker -- 0.33 packs/day for 10 years    Types: Cigarettes  . Smokeless tobacco: Never Used  . Alcohol Use: No     Comment: Denies Currently but previously marked as yes    Review of Systems  Respiratory: Negative for shortness of breath.    Gastrointestinal: Negative for nausea, vomiting and abdominal pain.  Endocrine: Negative for polydipsia and polyuria.  Genitourinary: Negative for dysuria.  All other systems reviewed and are negative.      Allergies  Review of patient's allergies indicates no known allergies.  Home Medications   Current Outpatient Rx  Name  Route  Sig  Dispense  Refill  . amLODipine (NORVASC) 10 MG tablet   Oral   Take 1 tablet (10 mg total) by mouth daily.   30 tablet   4   . aspirin 325 MG tablet   Oral   Take 1 tablet (325 mg total) by mouth daily.   30 tablet   3   . ciprofloxacin (CIPRO) 500 MG tablet   Oral   Take 1 tablet (500 mg total) by mouth 2 (two) times daily. X 2 weeks   28 tablet   0   . doxycycline (VIBRA-TABS) 100 MG tablet   Oral   Take 1 tablet (100 mg total) by mouth 2 (two) times daily. X 2 weeks   28 tablet   0   . insulin aspart (NOVOLOG) 100 UNIT/ML injection   Subcutaneous   Inject 0-9 Units into the skin 3 (three) times daily with meals. Sliding scale CBG 70 - 120: 0 units CBG 121 - 150: 1 unit,  CBG 151 - 200: 2 units,  CBG 201 - 250: 3 units,  CBG 251 - 300: 5 units,  CBG 301 - 350: 7 units,  CBG 351 - 400: 9 units   CBG >  400: 9 units and notify your MD   10 mL   11   . insulin glargine (LANTUS) 100 UNIT/ML injection   Subcutaneous   Inject 0.15 mLs (15 Units total) into the skin at bedtime.   10 mL   11   . lisinopril (PRINIVIL,ZESTRIL) 10 MG tablet   Oral   Take 1 tablet (10 mg total) by mouth daily.   30 tablet   5   . naphazoline (NAPHCON) 0.1 % ophthalmic solution   Both Eyes   Place 1 drop into both eyes 4 (four) times daily as needed for irritation.   15 mL   0   . research study medication   Oral   Take 75 mg by mouth daily with breakfast.         . simvastatin (ZOCOR) 10 MG tablet   Oral   Take 1 tablet (10 mg total) by mouth at bedtime.   30 tablet   5   . terbinafine (LAMISIL) 250 MG tablet   Oral   Take 1 tablet  (250 mg total) by mouth daily.   14 tablet   0    BP 168/78  Pulse 75  Temp(Src) 97.7 F (36.5 C) (Oral)  Resp 18  SpO2 99% Physical Exam  Nursing note and vitals reviewed. Constitutional: He is oriented to person, place, and time. He appears well-developed and well-nourished. No distress.  HENT:  Head: Normocephalic and atraumatic.  Mouth/Throat: Oropharynx is clear and moist.  Eyes: Conjunctivae and EOM are normal. Pupils are equal, round, and reactive to light.  Neck: Normal range of motion. Neck supple.  Cardiovascular: Normal rate, regular rhythm and intact distal pulses.   No murmur heard. Pulmonary/Chest: Effort normal and breath sounds normal. No respiratory distress. He has no wheezes. He has no rales.  Abdominal: Soft. He exhibits no distension. There is no tenderness. There is no rebound and no guarding.  Musculoskeletal: Normal range of motion. He exhibits no edema and no tenderness.  Neurological: He is alert and oriented to person, place, and time.  Skin: Skin is warm and dry. No rash noted. No erythema.  Psychiatric: He has a normal mood and affect. His behavior is normal.    ED Course  Procedures (including critical care time) Labs Review Labs Reviewed  CBC WITH DIFFERENTIAL - Abnormal; Notable for the following:    RBC 3.80 (*)    Hemoglobin 12.0 (*)    HCT 35.1 (*)    All other components within normal limits  URINALYSIS, ROUTINE W REFLEX MICROSCOPIC - Abnormal; Notable for the following:    Glucose, UA >1000 (*)    All other components within normal limits  CBG MONITORING, ED - Abnormal; Notable for the following:    Glucose-Capillary 486 (*)    All other components within normal limits  I-STAT CHEM 8, ED - Abnormal; Notable for the following:    Sodium 134 (*)    BUN 31 (*)    Creatinine, Ser 1.40 (*)    Glucose, Bld 503 (*)    Hemoglobin 12.6 (*)    HCT 37.0 (*)    All other components within normal limits  CBG MONITORING, ED - Abnormal;  Notable for the following:    Glucose-Capillary 346 (*)    All other components within normal limits  CBG MONITORING, ED - Abnormal; Notable for the following:    Glucose-Capillary 306 (*)    All other components within normal limits  URINE MICROSCOPIC-ADD ON   Imaging  Review No results found.   EKG Interpretation None      MDM   Final diagnoses:  Hyperglycemia    Patient is here do to persistent high blood sugars. He states they've been running greater than 400 for the last 3 days however upon further evaluation patient's blood sugars always run in the 400s despite taking insulin. However it sounds like he is not taking his insulin today as it is prescribed and also lives in a boarding home where he does not cook his own meals.  Pt o/w has no complaints.   Discussed in detail diabetic diet and insulin schedule. CBC, i-stat and UA pending.  Pt given 1L bolus and insulin.  Also pt has appt with his PCP in April for further DM management.  All labs wnl except for hyperglycemia and mild renal insufficiency with elevated Cr of 1.5 from .7.  BS improved to 300 after insulin and fluids.  Will have f/u with pcp for further management and monitoring.    Gwyneth SproutWhitney Kadden Osterhout, MD 04/14/13 1217

## 2013-04-14 NOTE — ED Notes (Signed)
CBG 486 

## 2013-04-14 NOTE — ED Notes (Signed)
Pt now sts not taking lantus insulin

## 2013-04-14 NOTE — ED Notes (Signed)
Pt c/o hyperglycemia this am with CBG reading over 400 at home today; pt sts taking meds and sts took 20 units of 70/30 this am

## 2013-04-17 ENCOUNTER — Ambulatory Visit: Payer: Self-pay

## 2013-04-18 ENCOUNTER — Emergency Department (HOSPITAL_COMMUNITY): Payer: Medicaid Other

## 2013-04-18 ENCOUNTER — Inpatient Hospital Stay (HOSPITAL_COMMUNITY)
Admission: EM | Admit: 2013-04-18 | Discharge: 2013-04-23 | DRG: 637 | Disposition: A | Discharge status: Skilled Nursing Facility | Payer: Medicaid Other | Attending: Internal Medicine | Admitting: Internal Medicine

## 2013-04-18 ENCOUNTER — Encounter (HOSPITAL_COMMUNITY): Payer: Self-pay | Admitting: Emergency Medicine

## 2013-04-18 DIAGNOSIS — D649 Anemia, unspecified: Secondary | ICD-10-CM | POA: Diagnosis present

## 2013-04-18 DIAGNOSIS — D72829 Elevated white blood cell count, unspecified: Secondary | ICD-10-CM | POA: Diagnosis present

## 2013-04-18 DIAGNOSIS — E1110 Type 2 diabetes mellitus with ketoacidosis without coma: Secondary | ICD-10-CM | POA: Diagnosis present

## 2013-04-18 DIAGNOSIS — L03039 Cellulitis of unspecified toe: Secondary | ICD-10-CM | POA: Diagnosis present

## 2013-04-18 DIAGNOSIS — Z79899 Other long term (current) drug therapy: Secondary | ICD-10-CM

## 2013-04-18 DIAGNOSIS — R1310 Dysphagia, unspecified: Secondary | ICD-10-CM | POA: Diagnosis present

## 2013-04-18 DIAGNOSIS — M869 Osteomyelitis, unspecified: Secondary | ICD-10-CM | POA: Diagnosis present

## 2013-04-18 DIAGNOSIS — I674 Hypertensive encephalopathy: Secondary | ICD-10-CM | POA: Diagnosis present

## 2013-04-18 DIAGNOSIS — E1010 Type 1 diabetes mellitus with ketoacidosis without coma: Secondary | ICD-10-CM | POA: Diagnosis present

## 2013-04-18 DIAGNOSIS — E119 Type 2 diabetes mellitus without complications: Secondary | ICD-10-CM | POA: Diagnosis not present

## 2013-04-18 DIAGNOSIS — Z8673 Personal history of transient ischemic attack (TIA), and cerebral infarction without residual deficits: Secondary | ICD-10-CM

## 2013-04-18 DIAGNOSIS — F17200 Nicotine dependence, unspecified, uncomplicated: Secondary | ICD-10-CM | POA: Diagnosis present

## 2013-04-18 DIAGNOSIS — L02619 Cutaneous abscess of unspecified foot: Secondary | ICD-10-CM | POA: Diagnosis present

## 2013-04-18 DIAGNOSIS — I10 Essential (primary) hypertension: Secondary | ICD-10-CM | POA: Diagnosis present

## 2013-04-18 DIAGNOSIS — E870 Hyperosmolality and hypernatremia: Secondary | ICD-10-CM | POA: Diagnosis not present

## 2013-04-18 DIAGNOSIS — E876 Hypokalemia: Secondary | ICD-10-CM | POA: Diagnosis not present

## 2013-04-18 DIAGNOSIS — I639 Cerebral infarction, unspecified: Secondary | ICD-10-CM | POA: Diagnosis present

## 2013-04-18 DIAGNOSIS — N179 Acute kidney failure, unspecified: Secondary | ICD-10-CM | POA: Diagnosis present

## 2013-04-18 DIAGNOSIS — G9340 Encephalopathy, unspecified: Secondary | ICD-10-CM | POA: Diagnosis present

## 2013-04-18 DIAGNOSIS — Z794 Long term (current) use of insulin: Secondary | ICD-10-CM

## 2013-04-18 DIAGNOSIS — I6350 Cerebral infarction due to unspecified occlusion or stenosis of unspecified cerebral artery: Secondary | ICD-10-CM | POA: Diagnosis not present

## 2013-04-18 HISTORY — DX: Hypertensive crisis, unspecified: I16.9

## 2013-04-18 HISTORY — DX: Non-pressure chronic ulcer of other part of unspecified foot with unspecified severity: L97.509

## 2013-04-18 LAB — COMPREHENSIVE METABOLIC PANEL
ALT: 12 U/L (ref 0–53)
AST: 12 U/L (ref 0–37)
Albumin: 4.1 g/dL (ref 3.5–5.2)
Alkaline Phosphatase: 170 U/L — ABNORMAL HIGH (ref 39–117)
BUN: 50 mg/dL — ABNORMAL HIGH (ref 6–23)
CHLORIDE: 104 meq/L (ref 96–112)
CO2: 23 meq/L (ref 19–32)
Calcium: 9.7 mg/dL (ref 8.4–10.5)
Creatinine, Ser: 2 mg/dL — ABNORMAL HIGH (ref 0.50–1.35)
GFR calc Af Amer: 40 mL/min — ABNORMAL LOW (ref >90)
GFR calc non Af Amer: 34 mL/min — ABNORMAL LOW (ref >90)
Glucose, Bld: 964 mg/dL (ref 70–99)
Potassium: 4.7 mEq/L (ref 3.7–5.3)
SODIUM: 147 meq/L (ref 137–147)
Total Protein: 7.6 g/dL (ref 6.0–8.3)

## 2013-04-18 LAB — BLOOD GAS, ARTERIAL
Acid-base deficit: 1.2 mmol/L (ref 0.0–2.0)
Bicarbonate: 24.2 mEq/L — ABNORMAL HIGH (ref 20.0–24.0)
DRAWN BY: 318141
FIO2: 0.36 %
O2 Saturation: 98.1 %
PCO2 ART: 45.8 mmHg — AB (ref 35.0–45.0)
Patient temperature: 98.6
TCO2: 22.3 mmol/L (ref 0–100)
pH, Arterial: 7.343 — ABNORMAL LOW (ref 7.350–7.450)
pO2, Arterial: 115 mmHg — ABNORMAL HIGH (ref 80.0–100.0)

## 2013-04-18 LAB — CBC
HCT: 37.4 % — ABNORMAL LOW (ref 39.0–52.0)
Hemoglobin: 12 g/dL — ABNORMAL LOW (ref 13.0–17.0)
MCH: 30.8 pg (ref 26.0–34.0)
MCHC: 32.1 g/dL (ref 30.0–36.0)
MCV: 96.1 fL (ref 78.0–100.0)
PLATELETS: 254 10*3/uL (ref 150–400)
RBC: 3.89 MIL/uL — AB (ref 4.22–5.81)
RDW: 13 % (ref 11.5–15.5)
WBC: 12.4 10*3/uL — AB (ref 4.0–10.5)

## 2013-04-18 LAB — KETONES, QUALITATIVE

## 2013-04-18 LAB — I-STAT TROPONIN, ED: Troponin i, poc: 0 ng/mL (ref 0.00–0.08)

## 2013-04-18 LAB — URINALYSIS, ROUTINE W REFLEX MICROSCOPIC
BILIRUBIN URINE: NEGATIVE
Glucose, UA: >1000 mg/dL — AB
HGB URINE DIPSTICK: NEGATIVE
KETONES UR: NEGATIVE mg/dL
Leukocytes, UA: NEGATIVE
Nitrite: NEGATIVE
Protein, ur: 30 mg/dL — AB
Specific Gravity, Urine: 1.031 — ABNORMAL HIGH (ref 1.005–1.030)
Urobilinogen, UA: 0.2 mg/dL (ref 0.0–1.0)
pH: 5 (ref 5.0–8.0)

## 2013-04-18 LAB — BLOOD GAS, VENOUS
Acid-base deficit: 0.7 mmol/L (ref 0.0–2.0)
BICARBONATE: 24.8 meq/L — AB (ref 20.0–24.0)
FIO2: 0.21 %
O2 SAT: 70.1 %
Patient temperature: 98.6
TCO2: 22.8 mmol/L (ref 0–100)
pCO2, Ven: 47.1 mmHg (ref 45.0–50.0)
pH, Ven: 7.342 — ABNORMAL HIGH (ref 7.250–7.300)
pO2, Ven: 38.7 mmHg (ref 30.0–45.0)

## 2013-04-18 LAB — GLUCOSE, CAPILLARY: Glucose-Capillary: 571 mg/dL (ref 70–99)

## 2013-04-18 LAB — URINE MICROSCOPIC-ADD ON

## 2013-04-18 LAB — CBG MONITORING, ED: Glucose-Capillary: >600 mg/dL (ref 70–99)

## 2013-04-18 MED ORDER — SIMVASTATIN 10 MG PO TABS
10.0000 mg | ORAL_TABLET | Freq: Every day | ORAL | Status: DC
  Administered 2013-04-19 – 2013-04-22 (×5): 10 mg via ORAL
  Filled 2013-04-18 (×6): qty 1

## 2013-04-18 MED ORDER — HEPARIN SODIUM (PORCINE) 5000 UNIT/ML IJ SOLN: 5000.0000 [IU] | Freq: Three times a day (TID) | INTRAMUSCULAR | Status: DC

## 2013-04-18 MED ORDER — ALBUTEROL SULFATE (2.5 MG/3ML) 0.083% IN NEBU: 2.5000 mg | INHALATION_SOLUTION | RESPIRATORY_TRACT | Status: DC | PRN

## 2013-04-18 MED ORDER — SODIUM CHLORIDE 0.9 % IV SOLN: INTRAVENOUS | Status: DC

## 2013-04-18 MED ORDER — ONDANSETRON HCL 4 MG/2ML IJ SOLN: 4.0000 mg | Freq: Four times a day (QID) | INTRAMUSCULAR | Status: DC | PRN

## 2013-04-18 MED ORDER — AMLODIPINE BESYLATE 10 MG PO TABS
10.0000 mg | ORAL_TABLET | Freq: Every day | ORAL | Status: DC
  Administered 2013-04-19 – 2013-04-20 (×2): 10 mg via ORAL
  Filled 2013-04-18 (×3): qty 1

## 2013-04-18 MED ORDER — SODIUM CHLORIDE 0.9 % IV SOLN
1000.0000 mL | INTRAVENOUS | Status: DC
  Administered 2013-04-18: 1000 mL via INTRAVENOUS

## 2013-04-18 MED ORDER — GUAIFENESIN-DM 100-10 MG/5ML PO SYRP: 5.0000 mL | ORAL_SOLUTION | ORAL | Status: DC | PRN

## 2013-04-18 MED ORDER — SODIUM CHLORIDE 0.9 % IV SOLN
INTRAVENOUS | Status: DC
  Administered 2013-04-18: 9 [IU]/h via INTRAVENOUS
  Filled 2013-04-18: qty 1

## 2013-04-18 MED ORDER — DEXTROSE-NACL 5-0.45 % IV SOLN
INTRAVENOUS | Status: DC
  Administered 2013-04-19 (×2): via INTRAVENOUS

## 2013-04-18 MED ORDER — HYDRALAZINE HCL 20 MG/ML IJ SOLN
5.0000 mg | INTRAMUSCULAR | Status: DC | PRN
  Administered 2013-04-19: 5 mg via INTRAVENOUS
  Filled 2013-04-18: qty 1

## 2013-04-18 MED ORDER — DEXTROSE 50 % IV SOLN: 25.0000 mL | INTRAVENOUS | Status: DC | PRN

## 2013-04-18 MED ORDER — SODIUM CHLORIDE 0.9 % IV BOLUS (SEPSIS)
1000.0000 mL | Freq: Once | INTRAVENOUS | Status: AC
  Administered 2013-04-18: 1000 mL via INTRAVENOUS

## 2013-04-18 MED ORDER — ACETAMINOPHEN 325 MG PO TABS: 650.0000 mg | ORAL_TABLET | Freq: Four times a day (QID) | ORAL | Status: DC | PRN

## 2013-04-18 MED ORDER — DOXYCYCLINE HYCLATE 100 MG PO TABS
100.0000 mg | ORAL_TABLET | Freq: Two times a day (BID) | ORAL | Status: DC
  Administered 2013-04-19 – 2013-04-23 (×10): 100 mg via ORAL
  Filled 2013-04-18 (×12): qty 1

## 2013-04-18 MED ORDER — ASPIRIN 325 MG PO TABS
325.0000 mg | ORAL_TABLET | Freq: Every day | ORAL | Status: DC
  Administered 2013-04-19 – 2013-04-23 (×5): 325 mg via ORAL
  Filled 2013-04-18 (×5): qty 1

## 2013-04-18 MED ORDER — HYDROCODONE-ACETAMINOPHEN 5-325 MG PO TABS: 1.0000 | ORAL_TABLET | ORAL | Status: DC | PRN

## 2013-04-18 MED ORDER — POTASSIUM CHLORIDE 10 MEQ/100ML IV SOLN
10.0000 meq | INTRAVENOUS | Status: AC
  Administered 2013-04-19 (×2): 10 meq via INTRAVENOUS
  Filled 2013-04-18: qty 100

## 2013-04-18 MED ORDER — ALUM & MAG HYDROXIDE-SIMETH 200-200-20 MG/5ML PO SUSP: 30.0000 mL | Freq: Four times a day (QID) | ORAL | Status: DC | PRN

## 2013-04-18 MED ORDER — STUDY - INVESTIGATIONAL DRUG SIMPLE RECORD
75.0000 mg | Freq: Every day | Status: DC
  Filled 2013-04-18: qty 75

## 2013-04-18 MED ORDER — DEXTROSE-NACL 5-0.45 % IV SOLN: INTRAVENOUS | Status: DC

## 2013-04-18 MED ORDER — CLONIDINE HCL 0.2 MG/24HR TD PTWK
0.2000 mg | MEDICATED_PATCH | TRANSDERMAL | Status: DC
  Administered 2013-04-19: 0.2 mg via TRANSDERMAL
  Filled 2013-04-18: qty 1

## 2013-04-18 MED ORDER — ONDANSETRON HCL 4 MG PO TABS: 4.0000 mg | ORAL_TABLET | Freq: Four times a day (QID) | ORAL | Status: DC | PRN

## 2013-04-18 MED ORDER — NAPHAZOLINE HCL 0.1 % OP SOLN
1.0000 [drp] | Freq: Four times a day (QID) | OPHTHALMIC | Status: DC | PRN
  Filled 2013-04-18: qty 15

## 2013-04-18 MED ORDER — ACETAMINOPHEN 650 MG RE SUPP: 650.0000 mg | Freq: Four times a day (QID) | RECTAL | Status: DC | PRN

## 2013-04-18 MED ORDER — SODIUM CHLORIDE 0.9 % IV SOLN
INTRAVENOUS | Status: DC
  Filled 2013-04-18: qty 1

## 2013-04-18 NOTE — ED Notes (Signed)
Patient remains hypertensive MD aware Patient denies complaints--will continue to monitor  Side rails up, call bell in reach

## 2013-04-18 NOTE — ED Notes (Signed)
MD at bedside. 

## 2013-04-18 NOTE — ED Notes (Signed)
Bed: WA17 Expected date:  Expected time:  Means of arrival:  Comments: EMS/63 yo male with altered mental status/CBG "high"-lethargic

## 2013-04-18 NOTE — ED Notes (Signed)
Patient back from radiology Patient remains in NAD 

## 2013-04-18 NOTE — ED Notes (Signed)
Patient transported to radiology via stretcher Patient in NAD upon leaving room  

## 2013-04-18 NOTE — H&P (Addendum)
[facility] History and Physical  Manuel Higgins BMW:[id number] DOB: 04/10/1950 DOA: 04/18/2013  Referring physician: ED MD Dr. Micheline Mazeocherty PCP: Bernerd LimboMITCHELL, [name]   Chief Complaint: Confusion and elevated blood sugar.  HPI: Manuel Higgins is a 63 y.o. male with a history of multiple acute and subacute small vessel cerebral infarcts in March 2015, hypertension, and diabetes mellitus, who presents with a report of confusion. The patient is currently confused and the history is not reliable. The history, in part, was provided by the ED physician and staff. Accordingly, the patient reports generalized weakness and an elevated blood sugar. Otherwise, he appears to be confused about why he is in the hospital. According to the ED physician, the patient told her in the registered nurse that he had missed a few doses of insulin and that his blood glucose was reading "high". Apparently, he is a [facility]. No family or acquaintances are available for questioning. The patient denies headache, dizziness, difficulty swallowing, chest pain, or palpitations. He denies cough, fever, chills, diarrhea, and pain with urination.  In the ED, he was hypertensive with a blood pressure ranging from 170-190 and diastolic blood pressure ranging from 78-120. His lab data are significant for a venous glucose of 964, BUN of 50, creatinine of 2.0, WBC of 12.4, pH of 7.3, PCO2 of 46, and PO2 of 115 on oxygen. His anion gap is 20. He is being admitted for further evaluation and management.     Review of Systems:  Unable to get a reliable history other than what is above in the history of present illness.   Past Medical History  Diagnosis Date  . Hypertension   . Diabetes mellitus without complication   . Stroke 03/2013    Multiple infarcts  . Hypertensive crisis 02/15/2013  . Toe ulcer    Past Surgical History  Procedure Laterality Date  . Back surgery    . Tonsillectomy    .  Tee without cardioversion N/A 04/07/2013    Procedure: TRANSESOPHAGEAL ECHOCARDIOGRAM (TEE);  Surgeon: Lars MassonKatarina H Nelson, MD;  [facility];  Service: Cardiovascular;  Laterality: N/A;  . Loop recorder implant  04/08/2013    MDT LinQ implanted by Dr Ladona Ridgelaylor for cryptogenic stroke   Social History:  reports that he has been smoking Cigarettes.  He has a 3.3 pack-year smoking history. He has never used smokeless tobacco. He reports that he does not drink alcohol or use illicit drugs.[facility].  No Known Allergies  Family history: The patient is unable to give me a reliable family history.   Prior to Admission medications   Medication Sig Start Date End Date Taking? Authorizing Provider  amLODipine (NORVASC) 10 MG tablet Take 1 tablet (10 mg total) by mouth daily. 04/09/13  Yes Ripudeep Jenna LuoK Rai, MD  aspirin 325 MG tablet Take 1 tablet (325 mg total) by mouth daily. 04/09/13  Yes Ripudeep Jenna LuoK Rai, MD  insulin aspart (NOVOLOG) 100 UNIT/ML injection Inject 0-9 Units into the skin 3 (three) times daily with meals. Sliding scale CBG 70 - 120: 0 units CBG 121 - 150: 1 unit,  CBG 151 - 200: 2 units,  CBG 201 - 250: 3 units,  CBG 251 - 300: 5 units,  CBG 301 - 350: 7 units,  CBG 351 - 400: 9 units   CBG > 400: 9 units and notify your MD 04/09/13  Yes Ripudeep Jenna LuoK Rai, MD  lisinopril (PRINIVIL,ZESTRIL) 10  MG tablet Take 1 tablet (10 mg total) by mouth daily. 04/09/13  Yes Ripudeep Jenna Luo, MD  ciprofloxacin (CIPRO) 500 MG tablet Take 1 tablet (500 mg total) by mouth 2 (two) times daily. X 2 weeks 04/09/13   Ripudeep Jenna Luo, MD  doxycycline (VIBRA-TABS) 100 MG tablet Take 1 tablet (100 mg total) by mouth 2 (two) times daily. X 2 weeks 04/09/13   Ripudeep Jenna Luo, MD  insulin glargine (LANTUS) 100 UNIT/ML injection Inject 0.15 mLs (15 Units total) into the skin at bedtime. 04/09/13   Ripudeep Jenna Luo, MD  naphazoline (NAPHCON) 0.1 % ophthalmic solution Place 1 drop into both eyes 4  (four) times daily as needed for irritation. 02/16/13   Andrena Mews, DO  research study medication Take 75 mg by mouth daily with breakfast. 04/09/13   Ripudeep Jenna Luo, MD  simvastatin (ZOCOR) 10 MG tablet Take 1 tablet (10 mg total) by mouth at bedtime. 04/09/13   Ripudeep Jenna Luo, MD  terbinafine (LAMISIL) 250 MG tablet Take 1 tablet (250 mg total) by mouth daily. 03/26/13   Rodolph Bong, MD   Physical Exam: Filed Vitals:   04/18/13 2300  BP: 178/120  Pulse: 109  Temp: 98.3 F (36.8 C)  Resp: 12    BP 178/120  Pulse 109  Temp(Src) 98.3 F (36.8 C) (Oral)  Resp 12  Ht 5\' 9"  (1.753 m)  Wt 72.2 kg (159 lb 2.8 oz)  BMI 23.49 kg/m2  SpO2 97%  General:  Appears calm and comfortable, but confused. Eyes: PERRL, normal lids, irises & conjunctiva ENT: Mucous membranes are mildly dry. No posterior exudates or edema. Full set of dentures. He appears to be slightly hard of hearing. Neck: no LAD, masses or thyromegaly Cardiovascular: S1, S2, with a soft systolic murmur. No LE edema. Left chest wall with palpable loop recorder. No surrounding erythema or tenderness. Telemetry: SR, no arrhythmias  Respiratory: CTA bilaterally, no w/r/r. Normal respiratory effort. Abdomen: Positive bowel sounds, soft, ntnd Skin: Left great toe callus over well-healed previous ulcer. No surrounding fluctuance, erythema, or drainage. No tenderness. Musculoskeletal: grossly normal tone BUE/BLE Psychiatric: He is alert and his speech is clear. However he appears to be confused with why he is in hospital and his recent medical history. Neurologic: He is alert and oriented to place and his name. He has a mild right facial droop. Strength in the supine position is 5 over 5 throughout with exception of 4+ over 5 right lower extremity strength. Sensation is grossly intact.           Labs on Admission:  Basic Metabolic Panel:  Recent Labs Lab 04/14/13 0938 04/18/13 2000  NA 134* 147  K 5.0 4.7  CL 96 104  CO2   --  23  GLUCOSE 503* 964*  BUN 31* 50*  CREATININE 1.40* 2.00*  CALCIUM  --  9.7   Liver Function Tests:  Recent Labs Lab 04/18/13 2000  AST 12  ALT 12  ALKPHOS 170*  BILITOT <0.2*  PROT 7.6  ALBUMIN 4.1   No results found for this basename: LIPASE, AMYLASE,  in the last 168 hours No results found for this basename: AMMONIA,  in the last 168 hours CBC:  Recent Labs Lab 04/14/13 0922 04/14/13 0938 04/18/13 2000  WBC 7.3  --  12.4*  NEUTROABS 5.3  --   --   HGB 12.0* 12.6* 12.0*  HCT 35.1* 37.0* 37.4*  MCV 92.4  --  96.1  PLT 260  --  254   Cardiac Enzymes: No results found for this basename: CKTOTAL, CKMB, CKMBINDEX, TROPONINI,  in the last 168 hours  BNP (last 3 results) No results found for this basename: PROBNP,  in the last 8760 hours CBG:  Recent Labs Lab 04/14/13 0909 04/14/13 1100 04/14/13 1211 04/18/13 1946 04/18/13 2257  GLUCAP 486* 346* 306* >600* 571*    Radiological Exams on Admission: Dg Chest 2 View  04/18/2013   CLINICAL DATA:  Hyperglycemia  EXAM: CHEST  2 VIEW  COMPARISON:  04/04/2013  FINDINGS: Cardiomediastinal silhouette is stable. No acute infiltrate or pleural effusion. No pulmonary edema. Bony thorax is unremarkable.  IMPRESSION: No active cardiopulmonary disease.   Electronically Signed   By: Natasha Mead M.D.   On: 04/18/2013 20:55    EKG: Independently reviewed. Sinus tachycardia with a heart rate of 109 beats per minute and LVH.  Assessment/Plan Principal Problem:   DKA (diabetic ketoacidoses) Active Problems:   Diabetes   Acute encephalopathy   HTN (hypertension), malignant   CVA (cerebral infarction)   Acute renal failure   1. Insulin requiring diabetes mellitus with DKA. The patient's anion gap is 20 and his venous glucose is greater than 900. It is unclear if the patient is even getting his insulin or giving himself insulin on a consistent basis at the homeless shelter. Insulin drip protocol started in the emergency  department. We'll start DKA protocol with the insulin drip, CBG measurements, and BMETs. We'll start aggressive IV fluid hydration following the 2 L bolus in the ED. Will transition him to sliding scale NovoLog and Lantus when DKA has resolved. His hemoglobin A1c was 11.4 on 04/05/13. 2. Acute encephalopathy. This is likely the result of uncontrolled diabetes, but will consider hypertensive encephalopathy and residual effects of recent stroke. We'll order a CT scan of his head for further evaluation. 3. 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 He was enrolled in the POINT trial during the previous hospitalization. This was discussed with the Pharm.D., Jacquenette Shone. Apparently, the patient should not have heparin or Lovenox or non-study Plavix. Therefore, SCDs will be ordered and subcutaneous heparin will be discontinued. 4. Status post loop recorder insertion. Loop recorder was inserted during the previous hospitalization for workup of cerebral multi-infarcts. The patient does not know when he is to followup with cardiologist Dr. Lewayne Bunting. Would recommend consulting cardiology in the next day or 2 for followup. 5. Acute renal failure, likely secondary to prerenal azotemia/dehydration in the setting of ACE inhibitor therapy  His creatinine was 0.96 last month. We'll continue IV fluid hydration and follow his renal function closely. 6. Malignant hypertension. He is treated with amlodipine and lisinopril chronically. Lisinopril will be discontinued in the setting of acute renal failure. We'll continue amlodipine. We'll add Catapres patch and when necessary IV hydralazine. 7. Query left great toe cellulitis/osteomyelitis. The patient was discharged on Cipro and doxycycline from the previous hospitalization over a week ago. Per exam, there does not appear to be any active evidence of cellulitis or abscess. Will, however, continue doxycycline for one more week. The patient was supposed to be scheduled for  wound care consult as an outpatient. 8. Psychosocial environment. Will ask the social worker or case manager to investigate the patient's support system and his ability to followup with appropriate medical followups and compliance with medical therapy.    Code Status: Full code Family Communication: No family available Disposition Plan: To be determined  Time spent: One hour and 10 minutes  Aria Pickrell [facility]  Pager [phone]

## 2013-04-18 NOTE — ED Notes (Signed)
Patient arrives via EMS from homeless shelter r/t c/o AMS Patient states that for the past two days his CBG has been reading "high" Patient states that he does take insulin and did take it today EMS CBG stated "high" 20 Left FA placed by EMS

## 2013-04-18 NOTE — ED Notes (Signed)
Pt aware of need of urine sample. Urinal at bedside 

## 2013-04-18 NOTE — ED Provider Notes (Signed)
CSN: [id number]     Arrival date & time 04/18/13  1941 History   First MD Initiated Contact with Patient 04/18/13 1946     Chief Complaint  Patient presents with  . Hyperglycemia  . Altered Mental Status     (Consider location/radiation/quality/duration/timing/severity/associated sxs/prior Treatment) Patient is a 63 y.o. male presenting with hyperglycemia. The history is provided by the patient. No language interpreter was used.  Hyperglycemia Severity:  Severe Onset quality:  Unable to specify Duration:  3 days Timing:  Constant Progression:  Worsening Chronicity:  Recurrent Diabetes status:  Controlled with insulin Current diabetic therapy:  Novolog, lantus Time since last antidiabetic medication:  1 day Context: noncompliance   Relieved by:  Nothing Ineffective treatments:  None tried Associated symptoms: altered mental status, chest pain, confusion, fatigue, increased thirst, polyuria and weakness   Associated symptoms: no abdominal pain, no dehydration, no dizziness, no dysuria, no fever, no nausea, no shortness of breath, no syncope, no vomiting and no weight change   Altered mental status:    Severity:  Unable to specify   Onset quality:  Unable to specify   Timing:  Unable to specify   Progression:  Unable to specify   Features:  Confusion Chest pain:    Quality:  Sharp   Severity:  Moderate   Onset quality:  Unable to specify   Duration:  2 days (lasting mins at a time)   Timing:  Intermittent   Progression:  Waxing and waning   Chronicity:  New   Past Medical History  Diagnosis Date  . Hypertension   . Diabetes mellitus without complication   . Stroke 03/2013    Multiple infarcts  . Hypertensive crisis 02/15/2013  . Toe ulcer    Past Surgical History  Procedure Laterality Date  . Back surgery    . Tonsillectomy    . Tee without cardioversion N/A 04/07/2013    Procedure: TRANSESOPHAGEAL ECHOCARDIOGRAM (TEE);  Surgeon: Lars Masson, MD;  Location: [facility]  ENDOSCOPY;  Service: Cardiovascular;  Laterality: N/A;  . Loop recorder implant  04/08/2013    MDT LinQ implanted by Dr Ladona Ridgel for cryptogenic stroke   History reviewed. No pertinent family history. History  Substance Use Topics  . Smoking status: Current Every Day Smoker -- 0.33 packs/day for 10 years    Types: Cigarettes  . Smokeless tobacco: Never Used  . Alcohol Use: No     Comment: Denies Currently but previously marked as yes    Review of Systems  Constitutional: Positive for fatigue. Negative for fever, activity change and appetite change.  HENT: Negative for congestion, facial swelling, rhinorrhea and trouble swallowing.   Eyes: Negative for photophobia and pain.  Respiratory: Negative for cough, chest tightness and shortness of breath.   Cardiovascular: Positive for chest pain. Negative for leg swelling and syncope.  Gastrointestinal: Negative for nausea, vomiting, abdominal pain, diarrhea and constipation.  Endocrine: Positive for polydipsia and polyuria.  Genitourinary: Negative for dysuria, urgency, decreased urine volume and difficulty urinating.  Musculoskeletal: Negative for back pain and gait problem.  Skin: Negative for color change, rash and wound.  Allergic/Immunologic: Negative for immunocompromised state.  Neurological: Negative for dizziness, facial asymmetry, speech difficulty, weakness, numbness and headaches.  Psychiatric/Behavioral: Positive for confusion. Negative for decreased concentration and agitation.      Allergies  Review of patient's allergies indicates no known allergies.  Home Medications   No current outpatient prescriptions on file. BP 177/78  Pulse 106  Resp 19  SpO2 97%  Physical Exam  Constitutional: He is oriented to person, place, and time. He appears well-developed and well-nourished. He appears listless. He appears ill. No distress.  HENT:  Head: Normocephalic and atraumatic.  Mouth/Throat: No oropharyngeal exudate.  Eyes:  Pupils are equal, round, and reactive to light.  Neck: Normal range of motion. Neck supple.  Cardiovascular: Normal rate, regular rhythm and normal heart sounds.  Exam reveals no gallop and no friction rub.   No murmur heard. Pulmonary/Chest: Effort normal and breath sounds normal. No respiratory distress. He has no wheezes. He has no rales.  Abdominal: Soft. Bowel sounds are normal. He exhibits no distension and no mass. There is no tenderness. There is no rebound and no guarding.  Musculoskeletal: Normal range of motion. He exhibits no edema and no tenderness.  Neurological: He is oriented to person, place, and time. He has normal strength. He appears listless. He displays no tremor. No cranial nerve deficit or sensory deficit. He exhibits normal muscle tone. Coordination normal. GCS eye subscore is 4. GCS verbal subscore is 5. GCS motor subscore is 6.  Skin: Skin is warm and dry.  Psychiatric: He has a normal mood and affect.    ED Course  Procedures (including critical care time) Labs Review Labs Reviewed  CBC - Abnormal; Notable for the following:    WBC 12.4 (*)    RBC 3.89 (*)    Hemoglobin 12.0 (*)    HCT 37.4 (*)    All other components within normal limits  COMPREHENSIVE METABOLIC PANEL - Abnormal; Notable for the following:    Glucose, Bld 964 (*)    BUN 50 (*)    Creatinine, Ser 2.00 (*)    Alkaline Phosphatase 170 (*)    Total Bilirubin <0.2 (*)    GFR calc non Af Amer 34 (*)    GFR calc Af Amer 40 (*)    All other components within normal limits  URINALYSIS, ROUTINE W REFLEX MICROSCOPIC - Abnormal; Notable for the following:    Specific Gravity, Urine 1.031 (*)    Glucose, UA >1000 (*)    Protein, ur 30 (*)    All other components within normal limits  BLOOD GAS, VENOUS - Abnormal; Notable for the following:    pH, Ven 7.342 (*)    Bicarbonate 24.8 (*)    All other components within normal limits  KETONES, QUALITATIVE - Abnormal; Notable for the following:     Acetone, Bld SMALL (*)    All other components within normal limits  BLOOD GAS, ARTERIAL - Abnormal; Notable for the following:    pH, Arterial 7.343 (*)    pCO2 arterial 45.8 (*)    pO2, Arterial 115.0 (*)    Bicarbonate 24.2 (*)    All other components within normal limits  CBG MONITORING, ED - Abnormal; Notable for the following:    Glucose-Capillary >600 (*)    All other components within normal limits  MRSA PCR SCREENING  URINE MICROSCOPIC-ADD ON  CBG MONITORING, ED  Rosezena Sensor, ED   Imaging Review Dg Chest 2 View  04/18/2013   CLINICAL DATA:  Hyperglycemia  EXAM: CHEST  2 VIEW  COMPARISON:  04/04/2013  FINDINGS: Cardiomediastinal silhouette is stable. No acute infiltrate or pleural effusion. No pulmonary edema. Bony thorax is unremarkable.  IMPRESSION: No active cardiopulmonary disease.   Electronically Signed   By: Natasha Mead M.D.   On: 04/18/2013 20:55     EKG Interpretation   Date/Time:  Friday April 18 2013 19:47:16  EDT Ventricular Rate:  109 PR Interval:  156 QRS Duration: 97 QT Interval:  353 QTC Calculation: 475 R Axis:   73 Text Interpretation:  Sinus tachycardia Probable left atrial enlargement  Left ventricular hypertrophy Anterior ST elevation, probably due to LVH  Borderline prolonged QT interval Confirmed by DOCHERTY  MD, MEGAN ([id number]  on 04/18/2013 7:53:54 PM      MDM   Final diagnoses:  DKA (diabetic ketoacidoses)    Pt is a 63 y.o. male with Pmhx as above who [facility] with AMS and [facility]. He admits to missing a dose of insulin today. He also reports 2 days of intermittent midsternal CP lasting mins at a time. None currently.  On PE, pt is listless, but oriented, no focal neuro findings, tachycardic. FSBG >600.  AG on CMP 20, Glu 964, +small acetones, Cr 2 which is elevated from baseline. Findings c/w DKA. 2L NS given, gluco-stablizer protocol will be started and pt will be admitted to [facility] on stepdown. CXR, trop nml.          Shanna CiscoMegan E Docherty, MD 04/18/13 2326

## 2013-04-19 ENCOUNTER — Inpatient Hospital Stay (HOSPITAL_COMMUNITY): Payer: Medicaid Other

## 2013-04-19 DIAGNOSIS — N179 Acute kidney failure, unspecified: Secondary | ICD-10-CM

## 2013-04-19 DIAGNOSIS — I10 Essential (primary) hypertension: Secondary | ICD-10-CM

## 2013-04-19 DIAGNOSIS — I6350 Cerebral infarction due to unspecified occlusion or stenosis of unspecified cerebral artery: Secondary | ICD-10-CM

## 2013-04-19 LAB — BASIC METABOLIC PANEL
BUN: 37 mg/dL — ABNORMAL HIGH (ref 6–23)
BUN: 40 mg/dL — ABNORMAL HIGH (ref 6–23)
BUN: 42 mg/dL — AB (ref 6–23)
BUN: 44 mg/dL — AB (ref 6–23)
CALCIUM: 9.5 mg/dL (ref 8.4–10.5)
CALCIUM: 9.4 mg/dL (ref 8.4–10.5)
CHLORIDE: 122 meq/L — AB (ref 96–112)
CO2: 27 mEq/L (ref 19–32)
CO2: 26 mEq/L (ref 19–32)
CO2: 27 mEq/L (ref 19–32)
CO2: 26 mEq/L (ref 19–32)
CREATININE: 1.33 mg/dL (ref 0.50–1.35)
CREATININE: 1.4 mg/dL — AB (ref 0.50–1.35)
CREATININE: 1.53 mg/dL — AB (ref 0.50–1.35)
Calcium: 9.4 mg/dL (ref 8.4–10.5)
Calcium: 9.3 mg/dL (ref 8.4–10.5)
Chloride: 119 mEq/L — ABNORMAL HIGH (ref 96–112)
Chloride: 123 mEq/L — ABNORMAL HIGH (ref 96–112)
Chloride: 118 mEq/L — ABNORMAL HIGH (ref 96–112)
Creatinine, Ser: 1.31 mg/dL (ref 0.50–1.35)
GFR calc Af Amer: 55 mL/min — ABNORMAL LOW (ref >90)
GFR calc Af Amer: 65 mL/min — ABNORMAL LOW (ref >90)
GFR calc non Af Amer: 53 mL/min — ABNORMAL LOW (ref >90)
GFR calc non Af Amer: 56 mL/min — ABNORMAL LOW (ref >90)
GFR, EST AFRICAN AMERICAN: 61 mL/min — AB (ref >90)
GFR, EST AFRICAN AMERICAN: 66 mL/min — AB (ref >90)
GFR, EST NON AFRICAN AMERICAN: 47 mL/min — AB (ref >90)
GFR, EST NON AFRICAN AMERICAN: 57 mL/min — AB (ref >90)
GLUCOSE: 190 mg/dL — AB (ref 70–99)
GLUCOSE: 463 mg/dL — AB (ref 70–99)
Glucose, Bld: 261 mg/dL — ABNORMAL HIGH (ref 70–99)
Glucose, Bld: 128 mg/dL — ABNORMAL HIGH (ref 70–99)
POTASSIUM: 3.8 meq/L (ref 3.7–5.3)
POTASSIUM: 4.3 meq/L (ref 3.7–5.3)
Potassium: 3.8 mEq/L (ref 3.7–5.3)
Potassium: 3.9 mEq/L (ref 3.7–5.3)
SODIUM: 154 meq/L — AB (ref 137–147)
Sodium: 157 mEq/L — ABNORMAL HIGH (ref 137–147)
Sodium: 160 mEq/L — ABNORMAL HIGH (ref 137–147)
Sodium: 162 mEq/L — ABNORMAL HIGH (ref 137–147)

## 2013-04-19 LAB — GLUCOSE, CAPILLARY
GLUCOSE-CAPILLARY: 106 mg/dL — AB (ref 70–99)
GLUCOSE-CAPILLARY: 125 mg/dL — AB (ref 70–99)
GLUCOSE-CAPILLARY: 318 mg/dL — AB (ref 70–99)
GLUCOSE-CAPILLARY: 320 mg/dL — AB (ref 70–99)
GLUCOSE-CAPILLARY: 468 mg/dL — AB (ref 70–99)
Glucose-Capillary: 121 mg/dL — ABNORMAL HIGH (ref 70–99)
Glucose-Capillary: 165 mg/dL — ABNORMAL HIGH (ref 70–99)
Glucose-Capillary: 206 mg/dL — ABNORMAL HIGH (ref 70–99)
Glucose-Capillary: 246 mg/dL — ABNORMAL HIGH (ref 70–99)
Glucose-Capillary: 358 mg/dL — ABNORMAL HIGH (ref 70–99)
Glucose-Capillary: 370 mg/dL — ABNORMAL HIGH (ref 70–99)
Glucose-Capillary: 88 mg/dL (ref 70–99)

## 2013-04-19 LAB — CBC
HCT: 34.1 % — ABNORMAL LOW (ref 39.0–52.0)
HCT: 34.3 % — ABNORMAL LOW (ref 39.0–52.0)
Hemoglobin: 11.1 g/dL — ABNORMAL LOW (ref 13.0–17.0)
Hemoglobin: 11.4 g/dL — ABNORMAL LOW (ref 13.0–17.0)
MCH: 30.7 pg (ref 26.0–34.0)
MCH: 30.8 pg (ref 26.0–34.0)
MCHC: 32.6 g/dL (ref 30.0–36.0)
MCHC: 33.2 g/dL (ref 30.0–36.0)
MCV: 92.7 fL (ref 78.0–100.0)
MCV: 94.5 fL (ref 78.0–100.0)
PLATELETS: 252 10*3/uL (ref 150–400)
Platelets: 229 10*3/uL (ref 150–400)
RBC: 3.61 MIL/uL — ABNORMAL LOW (ref 4.22–5.81)
RBC: 3.7 MIL/uL — ABNORMAL LOW (ref 4.22–5.81)
RDW: 12.7 % (ref 11.5–15.5)
RDW: 12.7 % (ref 11.5–15.5)
WBC: 15 10*3/uL — AB (ref 4.0–10.5)
WBC: 16.2 10*3/uL — ABNORMAL HIGH (ref 4.0–10.5)

## 2013-04-19 LAB — MRSA PCR SCREENING: MRSA by PCR: INVALID — AB

## 2013-04-19 MED ORDER — DEXTROSE 5 % IV SOLN
INTRAVENOUS | Status: DC
Start: 2013-04-19 — End: 2013-04-21
  Administered 2013-04-19: 75 mL via INTRAVENOUS
  Administered 2013-04-20 – 2013-04-21 (×3): via INTRAVENOUS

## 2013-04-19 MED ORDER — INSULIN ASPART 100 UNIT/ML ~~LOC~~ SOLN
0.0000 [IU] | SUBCUTANEOUS | Status: DC
  Administered 2013-04-19: 9 [IU] via SUBCUTANEOUS
  Administered 2013-04-19: 2 [IU] via SUBCUTANEOUS

## 2013-04-19 MED ORDER — INSULIN ASPART 100 UNIT/ML ~~LOC~~ SOLN
0.0000 [IU] | SUBCUTANEOUS | Status: DC
  Administered 2013-04-19: 15 [IU] via SUBCUTANEOUS
  Administered 2013-04-19: 2 [IU] via SUBCUTANEOUS
  Administered 2013-04-20: 15 [IU] via SUBCUTANEOUS
  Administered 2013-04-20: 11 [IU] via SUBCUTANEOUS
  Administered 2013-04-20: 3 [IU] via SUBCUTANEOUS
  Administered 2013-04-20: 13 [IU] via SUBCUTANEOUS
  Administered 2013-04-20: 3 [IU] via SUBCUTANEOUS
  Administered 2013-04-20 – 2013-04-21 (×2): 4 [IU] via SUBCUTANEOUS
  Administered 2013-04-21: 20 [IU] via SUBCUTANEOUS
  Administered 2013-04-21: 4 [IU] via SUBCUTANEOUS
  Administered 2013-04-21: 7 [IU] via SUBCUTANEOUS
  Administered 2013-04-21: 3 [IU] via SUBCUTANEOUS
  Administered 2013-04-22: 11 [IU] via SUBCUTANEOUS
  Administered 2013-04-22: 3 [IU] via SUBCUTANEOUS
  Administered 2013-04-22: 4 [IU] via SUBCUTANEOUS
  Administered 2013-04-22 (×2): 7 [IU] via SUBCUTANEOUS
  Administered 2013-04-22: 3 [IU] via SUBCUTANEOUS
  Administered 2013-04-23: 11 [IU] via SUBCUTANEOUS

## 2013-04-19 MED ORDER — LISINOPRIL 20 MG PO TABS
20.0000 mg | ORAL_TABLET | Freq: Every day | ORAL | Status: DC
  Administered 2013-04-19 – 2013-04-23 (×4): 20 mg via ORAL
  Filled 2013-04-19 (×5): qty 1

## 2013-04-19 MED ORDER — INSULIN GLARGINE 100 UNIT/ML ~~LOC~~ SOLN
15.0000 [IU] | Freq: Every day | SUBCUTANEOUS | Status: DC
  Administered 2013-04-19 – 2013-04-20 (×2): 15 [IU] via SUBCUTANEOUS
  Filled 2013-04-19 (×3): qty 0.15

## 2013-04-19 MED ORDER — HYDRALAZINE HCL 20 MG/ML IJ SOLN
10.0000 mg | Freq: Four times a day (QID) | INTRAMUSCULAR | Status: DC | PRN
  Administered 2013-04-19: 10 mg via INTRAVENOUS
  Filled 2013-04-19: qty 1
  Filled 2013-04-19: qty 0.5

## 2013-04-19 MED ORDER — HYDRALAZINE HCL 25 MG PO TABS
25.0000 mg | ORAL_TABLET | Freq: Three times a day (TID) | ORAL | Status: DC
Start: 2013-04-19 — End: 2013-04-23
  Administered 2013-04-19 – 2013-04-23 (×11): 25 mg via ORAL
  Filled 2013-04-19 (×16): qty 1

## 2013-04-19 NOTE — Progress Notes (Signed)
[facility] PROGRESS NOTE  Manuel Higgins ZOX:[id number] DOB: Feb 14, 1950 DOA: 04/18/2013 PCP: Bernerd Limbo  Assessment/Plan:  1. Insulin requiring diabetes mellitus with DKA. He was started on IV glucose, and transitioned to lantus . Will transition him to sliding scale NovoLog as DKA has resolved. His hemoglobin A1c was 11.4 on 04/05/13. 2. Acute encephalopathy. This is likely the result of uncontrolled diabetes, but will consider hypertensive encephalopathy and residual effects of recent stroke. Ct HEAD does not reveal new strokes.  3. 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 He was enrolled in the POINT trial during the previous hospitalization.but he never took any medications as per Radio producer.  4. Status post loop recorder insertion. Loop recorder was inserted during the previous hospitalization for workup of cerebral multi-infarcts. The patient does not know when he is to followup with cardiologist Dr. Lewayne Bunting.  5. Acute renal failure, likely secondary to prerenal azotemia/dehydration : on fluids with improvement in renal function.  6. Malignant hypertension. He is treated with amlodipine and lisinopril chronically.  We'll continue amlodipine. We'll add Catapres patch and when necessary IV hydralazine. 7. Query left great toe cellulitis/osteomyelitis. The patient was discharged on Cipro and doxycycline from the previous hospitalization over a week ago. Per exam, there does not appear to be any active evidence of cellulitis or abscess. Will, however, continue doxycycline for one more week. The patient was supposed to be scheduled for wound care consult as an outpatient. Wound care consulted in patinet.  8. Psychosocial environment. Will ask the social worker or case manager to investigate the patient's support system and his ability to followup with appropriate medical followups and compliance with medical therapy. Code Status: Full code  Family Communication: No family  available  Disposition Plan: To be determined   Consultants:  NONE  Procedures: CT HEAD   Antibiotics:  Doxycycline   HPI/Subjective: SLEEPY,,    Objective: Filed Vitals:   04/19/13 1212  BP: 176/79  Pulse:   Temp:   Resp:     Intake/Output Summary (Last 24 hours) at 04/19/13 1405 Last data filed at 04/19/13 1200  Gross per 24 hour  Intake   1750 ml  Output    400 ml  Net   1350 ml   Filed Weights   04/18/13 2300 04/19/13 0301  Weight: 72.2 kg (159 lb 2.8 oz) 72.2 kg (159 lb 2.8 oz)    Exam:   General:  lethargic  Cardiovascular: s1s2  Respiratory: ctab  Abdomen: soft NT ND BS+  Musculoskeletal: NOpedal edema.   Data Reviewed: Basic Metabolic Panel:  Recent Labs Lab 04/18/13 2000 04/19/13 0039 04/19/13 0324 04/19/13 0559 04/19/13 0956  NA 147 157* 160* 162* 154*  K 4.7 3.8 3.8 3.9 4.3  CL 104 119* 122* 123* 118*  CO2 23 27 26 27 26   GLUCOSE 964* 463* 261* 128* 190*  BUN 50* 44* 42* 40* 37*  CREATININE 2.00* 1.53* 1.40* 1.33 1.31  CALCIUM 9.7 9.4 9.3 9.5 9.4   Liver Function Tests:  Recent Labs Lab 04/18/13 2000  AST 12  ALT 12  ALKPHOS 170*  BILITOT <0.2*  PROT 7.6  ALBUMIN 4.1   No results found for this basename: LIPASE, AMYLASE,  in the last 168 hours No results found for this basename: AMMONIA,  in the last 168 hours CBC:  Recent Labs Lab 04/14/13 0922 04/14/13 0938 04/18/13 0039 04/18/13 2000 04/19/13 0556  WBC 7.3  --  15.0* 12.4* 16.2*  NEUTROABS 5.3  --   --   --   --  HGB 12.0* 12.6* 11.4* 12.0* 11.1*  HCT 35.1* 37.0* 34.3* 37.4* 34.1*  MCV 92.4  --  92.7 96.1 94.5  PLT 260  --  252 254 229   Cardiac Enzymes: No results found for this basename: CKTOTAL, CKMB, CKMBINDEX, TROPONINI,  in the last 168 hours BNP (last 3 results) No results found for this basename: PROBNP,  in the last 8760 hours CBG:  Recent Labs Lab 04/19/13 0359 04/19/13 0503 04/19/13 0607 04/19/13 0716 04/19/13 0821  GLUCAP 206*  165* 125* 88 106*    Recent Results (from the past 240 hour(s))  MRSA PCR SCREENING     Status: Abnormal   Collection Time    04/18/13 10:49 PM      Result Value Ref Range Status   MRSA by PCR INVALID RESULTS, SPECIMEN SENT FOR CULTURE (*) NEGATIVE Final   Comment: GGAARLAND RN AT 0100 ON [id number]                The GeneXpert MRSA Assay (FDA     approved for NASAL specimens     only), is one component of a     comprehensive MRSA colonization     surveillance program. It is not     intended to diagnose MRSA     infection nor to guide or     monitor treatment for     MRSA infections.     Studies: Dg Chest 2 View  04/18/2013   CLINICAL DATA:  Hyperglycemia  EXAM: CHEST  2 VIEW  COMPARISON:  04/04/2013  FINDINGS: Cardiomediastinal silhouette is stable. No acute infiltrate or pleural effusion. No pulmonary edema. Bony thorax is unremarkable.  IMPRESSION: No active cardiopulmonary disease.   Electronically Signed   By: Natasha Mead M.D.   On: 04/18/2013 20:55   Ct Head Wo Contrast  04/19/2013   CLINICAL DATA:  Encephalopathy; recent CVA.  EXAM: CT HEAD WITHOUT CONTRAST  TECHNIQUE: Contiguous axial images were obtained from the base of the skull through the vertex without intravenous contrast.  COMPARISON:  MRI/MRA of the brain, and CT of the head, performed 04/05/2011  FINDINGS: Known scattered small acute infarcts at the corona radiata bilaterally and right body of the corpus callosum are not well seen on CT. There is no evidence of acute infarction, mass lesion, or intra- or extra-axial hemorrhage on CT.  Scattered periventricular white matter change likely reflects small vessel ischemic microangiopathy. Small chronic lacunar infarcts are seen at the cerebellar hemispheres bilaterally.  The brainstem and fourth ventricle are within normal limits. The third and lateral ventricles, and basal ganglia are unremarkable in appearance. The cerebral hemispheres demonstrate grossly normal gray-white  differentiation. No mass effect or midline shift is seen.  There is no evidence of fracture; visualized osseous structures are unremarkable in appearance. The orbits are within normal limits. The paranasal sinuses and mastoid air cells are well-aerated. No significant soft tissue abnormalities are seen.  IMPRESSION: 1. Known scattered small acute infarcts at the corona radiata bilaterally and right body of the corpus callosum are not well assessed on CT. No evidence of hemorrhage transformation. 2. Scattered small vessel ischemic microangiopathy. 3. Small chronic lacunar infarcts at the cerebellar hemispheres bilaterally.   Electronically Signed   By: Roanna Raider M.D.   On: 04/19/2013 06:26    Scheduled Meds: . amLODipine  10 mg Oral Daily  . aspirin  325 mg Oral Daily  . cloNIDine  0.2 mg Transdermal Weekly  . doxycycline  100 mg Oral BID  .  hydrALAZINE  25 mg Oral 3 times per day  . insulin aspart  0-9 Units Subcutaneous Q4H  . insulin glargine  15 Units Subcutaneous Daily  . lisinopril  20 mg Oral Daily  . simvastatin  10 mg Oral QHS   Continuous Infusions: . dextrose 75 mL (04/19/13 1212)    Principal Problem:   DKA (diabetic ketoacidoses) Active Problems:   Diabetes   CVA (cerebral infarction)   Acute encephalopathy   Acute renal failure   HTN (hypertension), malignant    Time spent: 35 min    Manuel Higgins  [facility] Pager [phone] If 7PM-7AM, please contact night-coverage at [email or web address], password [email or web address] 04/19/2013, 2:05 PM  LOS: 1 day

## 2013-04-19 NOTE — Evaluation (Signed)
Clinical/Bedside Swallow Evaluation Patient Details  Name: Charlynne PanderDarrell Streb MRN: [id number] Date of Birth: 07/28/1950  Today's Date: 04/19/2013 Time: [id number] SLP Time Calculation (min): 27 min  Past Medical History:  Past Medical History  Diagnosis Date  . Hypertension   . Diabetes mellitus without complication   . Stroke 03/2013    Multiple infarcts  . Hypertensive crisis 02/15/2013  . Toe ulcer    Past Surgical History:  Past Surgical History  Procedure Laterality Date  . Back surgery    . Tonsillectomy    . Tee without cardioversion N/A 04/07/2013    Procedure: TRANSESOPHAGEAL ECHOCARDIOGRAM (TEE);  Surgeon: Lars MassonKatarina H Nelson, MD;  [facility];  Service: Cardiovascular;  Laterality: N/A;  . Loop recorder implant  04/08/2013    MDT LinQ implanted by Dr Ladona Ridgelaylor for cryptogenic stroke   HPI:  63 year old male with with history of hypertension, insulin-dependent diabetes mellitus admitted with DKA.  Pt with h/o CVA.  Swallow evaluation was ordered.    Assessment / Plan / Recommendation Clinical Impression  Pt presents with cognitive based dysphagia marked by delayed oral transiting, decreased mastication abilities.  Overt cough noted after swallow of cracker and fruit cocktail - with pt stating "I'm choking a little."  Pt pointed to proximal esophagus to indicate are of stasis sensation requiring him to cough to clear - per pt has occured in the past.   ? if pt could have some esophaegal motility issues given his DM.  Pt tolerating icecream, juice and ice much better than solids (no s/s of aspiration with liquids and more efficient swallow).     Also question if pt has abrasion/ulcer posterior soft palate - he denies pain with swallowing nor acknowledgement of presence- advised RN.    Recommend pt consume full liquid due cognition and esophageal symptoms of dysphagia with precautions.  Educated pt to findings, compensation strategies and aspiration precautions.  SLP to follow  up at bedside for dietary advancement readiness.      Aspiration Risk  Moderate    Diet Recommendation Thin liquid (full liquids)   Liquid Administration via: Cup;Straw Medication Administration: Whole meds with liquid Supervision: Patient able to self feed;Intermittent supervision to cue for compensatory strategies Compensations: Slow rate;Small sips/bites Postural Changes and/or Swallow Maneuvers: Seated upright 90 degrees    Other  Recommendations Oral Care Recommendations: Oral care BID   Follow Up Recommendations   (tbd)    Frequency and Duration min 2x/week  1 week   Pertinent Vitals/Pain Afebrile, decreased      Swallow Study Prior Functional Status   see HHX     General Date of Onset: 04/19/13 HPI: 63 year old male with with history of hypertension, insulin-dependent diabetes mellitus admitted with DKA.  Pt with h/o CVA.  Swallow evaluation was ordered.  Type of Study: Bedside swallow evaluation Diet Prior to this Study: Thin liquids (clears) Temperature Spikes Noted: No Respiratory Status: Room air History of Recent Intubation: No Behavior/Cognition: Alert;Cooperative;Pleasant mood;Confused Oral Cavity - Dentition: Dentures, top Patient Positioning: Upright in bed Baseline Vocal Quality: Clear Volitional Cough: Strong Volitional Swallow: Able to elicit    Oral/Motor/Sensory Function Overall Oral Motor/Sensory Function: Impaired Labial ROM: Reduced right Labial Symmetry: Abnormal symmetry right Labial Strength: Reduced Labial Sensation: Within Functional Limits Lingual ROM: Within Functional Limits Lingual Symmetry: Within Functional Limits Lingual Strength: Within Functional Limits Lingual Sensation: Within Functional Limits Facial ROM: Within Functional Limits Facial Symmetry: Within Functional Limits Facial Strength: Within Functional Limits Facial Sensation: Within Functional Limits Velum: Within Functional  Limits Mandible: Within Functional Limits    Ice Chips Ice chips: Impaired Presentation: Spoon;Self Fed Oral Phase Impairments: Impaired anterior to posterior transit;Reduced lingual movement/coordination Oral Phase Functional Implications: Prolonged oral transit Pharyngeal Phase Impairments: Suspected delayed Swallow   Thin Liquid Thin Liquid: Impaired Presentation: Cup;Straw Oral Phase Impairments: Impaired anterior to posterior transit Oral Phase Functional Implications: Prolonged oral transit Pharyngeal  Phase Impairments: Suspected delayed Swallow    Nectar Thick Nectar Thick Liquid: Not tested   Honey Thick Honey Thick Liquid: Not tested   Puree Puree: Impaired Oral Phase Impairments: Reduced lingual movement/coordination;Impaired anterior to posterior transit Oral Phase Functional Implications: Prolonged oral transit Pharyngeal Phase Impairments: Suspected delayed Swallow;Throat Clearing - Delayed   Solid   GO    Solid: Impaired Presentation: Self Fed Oral Phase Impairments: Reduced lingual movement/coordination;Impaired anterior to posterior transit Oral Phase Functional Implications: Oral holding;Other (comment) (delayed oral transiting) Pharyngeal Phase Impairments: Suspected delayed Swallow;Cough - Immediate;Cough - Delayed       Mills Koller, MS [facility] SLP [phone]

## 2013-04-19 NOTE — Progress Notes (Signed)
Pt has had increased sodium levels during the night, [facility] made aware.

## 2013-04-20 LAB — GLUCOSE, CAPILLARY
GLUCOSE-CAPILLARY: 159 mg/dL — AB (ref 70–99)
GLUCOSE-CAPILLARY: 183 mg/dL — AB (ref 70–99)
GLUCOSE-CAPILLARY: 264 mg/dL — AB (ref 70–99)
GLUCOSE-CAPILLARY: 294 mg/dL — AB (ref 70–99)
Glucose-Capillary: 111 mg/dL — ABNORMAL HIGH (ref 70–99)
Glucose-Capillary: 148 mg/dL — ABNORMAL HIGH (ref 70–99)
Glucose-Capillary: 188 mg/dL — ABNORMAL HIGH (ref 70–99)
Glucose-Capillary: 189 mg/dL — ABNORMAL HIGH (ref 70–99)
Glucose-Capillary: 276 mg/dL — ABNORMAL HIGH (ref 70–99)
Glucose-Capillary: 303 mg/dL — ABNORMAL HIGH (ref 70–99)

## 2013-04-20 LAB — CBC
HEMATOCRIT: 33.4 % — AB (ref 39.0–52.0)
Hemoglobin: 10.8 g/dL — ABNORMAL LOW (ref 13.0–17.0)
MCH: 30.8 pg (ref 26.0–34.0)
MCHC: 32.3 g/dL (ref 30.0–36.0)
MCV: 95.2 fL (ref 78.0–100.0)
Platelets: 212 10*3/uL (ref 150–400)
RBC: 3.51 MIL/uL — AB (ref 4.22–5.81)
RDW: 12.8 % (ref 11.5–15.5)
WBC: 13 10*3/uL — AB (ref 4.0–10.5)

## 2013-04-20 LAB — BASIC METABOLIC PANEL
BUN: 29 mg/dL — AB (ref 6–23)
CHLORIDE: 116 meq/L — AB (ref 96–112)
CO2: 28 mEq/L (ref 19–32)
Calcium: 8.9 mg/dL (ref 8.4–10.5)
Creatinine, Ser: 1.15 mg/dL (ref 0.50–1.35)
GFR calc Af Amer: 78 mL/min — ABNORMAL LOW (ref >90)
GFR calc non Af Amer: 67 mL/min — ABNORMAL LOW (ref >90)
Glucose, Bld: 131 mg/dL — ABNORMAL HIGH (ref 70–99)
POTASSIUM: 3.6 meq/L — AB (ref 3.7–5.3)
Sodium: 152 mEq/L — ABNORMAL HIGH (ref 137–147)

## 2013-04-20 MED ORDER — INSULIN GLARGINE 100 UNIT/ML ~~LOC~~ SOLN
20.0000 [IU] | Freq: Every day | SUBCUTANEOUS | Status: DC
  Administered 2013-04-21 – 2013-04-23 (×3): 20 [IU] via SUBCUTANEOUS
  Filled 2013-04-20 (×3): qty 0.2

## 2013-04-20 NOTE — Progress Notes (Signed)
Speech Language Pathology Treatment: Dysphagia  Patient Details Name: Manuel PanderDarrell Sieben MRN: [id number] DOB: 01/17/1950 Today's Date: 04/20/2013 Time: 1645-1700 SLP Time Calculation (min): 15 min  Assessment / Plan / Recommendation Clinical Impression  RN paged treating SLP to assess swallow for possible diet advancement from clear liquid.  Initial BSE completed on 04/19/13 with diet recommendations of full liquid diet.  Patient observed with puree consistency and thin liquids by cup and straw.  Cough x1 s/p swallow of puree due to large amount.  Recommend to to advance to full liquids as recommended from BSE.  Recommend full supervision with meals due to noted decreased safety awareness.  ST to follow in acute care setting for diet tolerance and advancement.     HPI HPI: 63 year old male with with history of hypertension, insulin-dependent diabetes mellitus admitted with DKA.  Pt with h/o CVA.        SLP Plan  Continue with current plan of care    Recommendations Diet recommendations: Thin liquid;Other(comment) (Full liquid ) Liquids provided via: Cup;Straw Medication Administration: Whole meds with liquid Supervision: Patient able to self feed;Full supervision/cueing for compensatory strategies Compensations: Slow rate;Small sips/bites Postural Changes and/or Swallow Maneuvers: Out of bed for meals;Seated upright 90 degrees;Upright 30-60 min after meal              Oral Care Recommendations: Oral care BID Follow up Recommendations: Skilled Nursing facility (TBD ) Plan: Continue with current plan of care    GO    Moreen FowlerKaren Leya Paige MS, CCC-SLP [phone] [facility] HospitalDANKOF,Scarlettrose Costilow 04/20/2013, 5:42 PM

## 2013-04-20 NOTE — Progress Notes (Signed)
[facility] PROGRESS NOTE  Manuel Higgins WUJ:[id number] DOB: 01/11/1951 DOA: 04/18/2013 PCP: Bernerd LimboMITCHELL, Manuel  Assessment/Plan:  1. Insulin requiring diabetes mellitus with DKA. He was started on IV glucose, and transitioned to lantus . Will transition him to sliding scale NovoLog as DKA has resolved. His hemoglobin A1c was 11.4 on 04/05/13. 2. Acute encephalopathy: improved.  This is likely the result of uncontrolled diabetes, but will consider hypertensive encephalopathy and residual effects of recent stroke. Ct HEAD does not reveal new strokes.  3. 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 He was enrolled in the POINT trial during the previous hospitalization.but he never took any medications as per Radio producerresearch RN.  4. Status post loop recorder insertion. Loop recorder was inserted during the previous hospitalization for workup of cerebral multi-infarcts. The patient does not know when he is to followup with cardiologist Dr. Lewayne BuntingGregg Higgins.  5. Acute renal failure, likely secondary to prerenal azotemia/dehydration : on fluids with improvement in renal function.  6. Malignant hypertension. He is treated with amlodipine and lisinopril chronically.  We'll continue amlodipine. We'll add Catapres patch and when necessary IV hydralazine. 7. Query left great toe cellulitis/osteomyelitis. The patient was discharged on Cipro and doxycycline from the previous hospitalization over a week ago. Per exam, there does not appear to be any active evidence of cellulitis or abscess. Will, however, continue doxycycline for one more week. The patient was supposed to be scheduled for wound care consult as an outpatient. Wound care consulted in patinet.  8. Psychosocial environment. Will ask the social worker or case manager to investigate the patient's support system and his ability to followup with appropriate medical followups and compliance with medical therapy. Code Status: Full code  Family Communication: No  family available  Disposition Plan: To be determined   Consultants:  NONE  Procedures: CT HEAD   Antibiotics:  Doxycycline   HPI/Subjective: He is more alert today and conversing.    Objective: Filed Vitals:   04/20/13 1328  BP: 124/72  Pulse: 82  Temp: 98.1 F (36.7 C)  Resp: 18    Intake/Output Summary (Last 24 hours) at 04/20/13 1900 Last data filed at 04/20/13 1830  Gross per 24 hour  Intake   2275 ml  Output    500 ml  Net   1775 ml   Filed Weights   04/19/13 0301 04/19/13 1937 04/20/13 0500  Weight: 72.2 kg (159 lb 2.8 oz) 74.1 kg (163 lb 5.8 oz) 74.1 kg (163 lb 5.8 oz)    Exam:   General:  Alert afebrile comfortable.   Cardiovascular: s1s2  Respiratory: ctab  Abdomen: soft NT ND BS+  Musculoskeletal: NOpedal edema.   Data Reviewed: Basic Metabolic Panel:  Recent Labs Lab 04/19/13 0039 04/19/13 0324 04/19/13 0559 04/19/13 0956 04/20/13 0338  NA 157* 160* 162* 154* 152*  K 3.8 3.8 3.9 4.3 3.6*  CL 119* 122* 123* 118* 116*  CO2 27 26 27 26 28   GLUCOSE 463* 261* 128* 190* 131*  BUN 44* 42* 40* 37* 29*  CREATININE 1.53* 1.40* 1.33 1.31 1.15  CALCIUM 9.4 9.3 9.5 9.4 8.9   Liver Function Tests:  Recent Labs Lab 04/18/13 2000  AST 12  ALT 12  ALKPHOS 170*  BILITOT <0.2*  PROT 7.6  ALBUMIN 4.1   No results found for this basename: LIPASE, AMYLASE,  in the last 168 hours No results found for this basename: AMMONIA,  in the last 168 hours CBC:  Recent Labs Lab 04/14/13 0922 04/14/13 0938 04/18/13  1610 04/18/13 2000 04/19/13 0556 04/20/13 0338  WBC 7.3  --  15.0* 12.4* 16.2* 13.0*  NEUTROABS 5.3  --   --   --   --   --   HGB 12.0* 12.6* 11.4* 12.0* 11.1* 10.8*  HCT 35.1* 37.0* 34.3* 37.4* 34.1* 33.4*  MCV 92.4  --  92.7 96.1 94.5 95.2  PLT 260  --  252 254 229 212   Cardiac Enzymes: No results found for this basename: CKTOTAL, CKMB, CKMBINDEX, TROPONINI,  in the last 168 hours BNP (last 3 results) No results found  for this basename: PROBNP,  in the last 8760 hours CBG:  Recent Labs Lab 04/19/13 2350 04/20/13 0408 04/20/13 0734 04/20/13 1154 04/20/13 1646  GLUCAP 121* 148* 189* 303* 276*    Recent Results (from the past 240 hour(s))  MRSA PCR SCREENING     Status: Abnormal   Collection Time    04/18/13 10:49 PM      Result Value Ref Range Status   MRSA by PCR INVALID RESULTS, SPECIMEN SENT FOR CULTURE (*) NEGATIVE Final   Comment: GGAARLAND RN AT 0100 ON [id number]                The GeneXpert MRSA Assay (FDA     approved for NASAL specimens     only), is one component of a     comprehensive MRSA colonization     surveillance program. It is not     intended to diagnose MRSA     infection nor to guide or     monitor treatment for     MRSA infections.  MRSA CULTURE     Status: None   Collection Time    04/18/13 11:00 PM      Result Value Ref Range Status   Specimen Description NOSE   Final   Special Requests NONE   Final   Culture     Final   Value: NO SUSPICIOUS COLONIES, CONTINUING TO HOLD     Performed at [facility]   Report Status PENDING   Incomplete     Studies: Dg Chest 2 View  04/18/2013   CLINICAL DATA:  Hyperglycemia  EXAM: CHEST  2 VIEW  COMPARISON:  04/04/2013  FINDINGS: Cardiomediastinal silhouette is stable. No acute infiltrate or pleural effusion. No pulmonary edema. Bony thorax is unremarkable.  IMPRESSION: No active cardiopulmonary disease.   Electronically Signed   By: Natasha Mead M.D.   On: 04/18/2013 20:55   Ct Head Wo Contrast  04/19/2013   CLINICAL DATA:  Encephalopathy; recent CVA.  EXAM: CT HEAD WITHOUT CONTRAST  TECHNIQUE: Contiguous axial images were obtained from the base of the skull through the vertex without intravenous contrast.  COMPARISON:  MRI/MRA of the brain, and CT of the head, performed 04/05/2011  FINDINGS: Known scattered small acute infarcts at the corona radiata bilaterally and right body of the corpus callosum are not well seen on CT.  There is no evidence of acute infarction, mass lesion, or intra- or extra-axial hemorrhage on CT.  Scattered periventricular white matter change likely reflects small vessel ischemic microangiopathy. Small chronic lacunar infarcts are seen at the cerebellar hemispheres bilaterally.  The brainstem and fourth ventricle are within normal limits. The third and lateral ventricles, and basal ganglia are unremarkable in appearance. The cerebral hemispheres demonstrate grossly normal gray-white differentiation. No mass effect or midline shift is seen.  There is no evidence of fracture; visualized osseous structures are unremarkable in appearance. The orbits are  within normal limits. The paranasal sinuses and mastoid air cells are well-aerated. No significant soft tissue abnormalities are seen.  IMPRESSION: 1. Known scattered small acute infarcts at the corona radiata bilaterally and right body of the corpus callosum are not well assessed on CT. No evidence of hemorrhage transformation. 2. Scattered small vessel ischemic microangiopathy. 3. Small chronic lacunar infarcts at the cerebellar hemispheres bilaterally.   Electronically Signed   By: Roanna Raider M.D.   On: 04/19/2013 06:26    Scheduled Meds: . amLODipine  10 mg Oral Daily  . aspirin  325 mg Oral Daily  . cloNIDine  0.2 mg Transdermal Weekly  . doxycycline  100 mg Oral BID  . hydrALAZINE  25 mg Oral 3 times per day  . insulin aspart  0-20 Units Subcutaneous Q4H  . [START ON 04/21/2013] insulin glargine  20 Units Subcutaneous Daily  . lisinopril  20 mg Oral Daily  . simvastatin  10 mg Oral QHS   Continuous Infusions: . dextrose 75 mL/hr at 04/20/13 1349    Principal Problem:   DKA (diabetic ketoacidoses) Active Problems:   Diabetes   CVA (cerebral infarction)   Acute encephalopathy   Acute renal failure   HTN (hypertension), malignant    Time spent: 35 min    Danika Kluender  [facility] Pager [phone] If 7PM-7AM, please contact  night-coverage at [email or web address], password [email or web address] 04/20/2013, 7:00 PM  LOS: 2 days

## 2013-04-20 NOTE — Evaluation (Signed)
Physical Therapy Evaluation Patient Details Name: Manuel Higgins MRN: [id number] DOB: 03-Oct-1950 Today's Date: 04/20/2013   History of Present Illness  63 yo male admitted with DKA, AMS. Hx of CVA, HTN, DM. Pt is from homeless shelter  Clinical Impression  On eval, pt required Min guard assist for mobility-able to ambulate ~50 feet while holding onto IV pole (distance limited by leaking catheter)-nursing made aware. At this time, recommend HHPT, 24/7 supervision/assist.     Follow Up Recommendations Home health PT;Supervision/Assistance - 24 hour    Equipment Recommendations   (to be determined)    Recommendations for Other Services OT consult     Precautions / Restrictions Precautions Precautions: Fall Precaution Comments: per [facility] (via case manager), pt is unsteady on his feet.  Restrictions Weight Bearing Restrictions: No      Mobility  Bed Mobility Overal bed mobility: Modified Independent                Transfers Overall transfer level: Needs assistance   Transfers: Sit to/from Stand Sit to Stand: Supervision            Ambulation/Gait Ambulation/Gait assistance: Min guard Ambulation Distance (Feet): 50 Feet Assistive device:  (pt held onto IV pole)       General Gait Details: close guard for safety/assist. unsteady intermittently. pt held onto IV pole for support. slow gait speed. distance limited due to leaking catheter  Stairs            Wheelchair Mobility    Modified Rankin (Stroke Patients Only)       Balance   Sitting-balance support: No upper extremity supported Sitting balance-Leahy Scale: Good     Standing balance support: Single extremity supported;During functional activity Standing balance-Leahy Scale: Poor                               Pertinent Vitals/Pain No c/o pain    Home Living Family/patient expects to be discharged to:: Group home Living Arrangements: Other (Comment) ([facility]  shelter)   Type of Home: House         Home Equipment: None      Prior Function           Comments: Pt could perform self care.  He is required to make up his bed and keep his personal area clean, but not to do cooking or cleaning otherwise.     Hand Dominance        Extremity/Trunk Assessment   Upper Extremity Assessment: Defer to OT evaluation           Lower Extremity Assessment: Generalized weakness      Cervical / Trunk Assessment: Normal  Communication   Communication: No difficulties  Cognition Arousal/Alertness: Awake/alert Behavior During Therapy: Flat affect Overall Cognitive Status: Within Functional Limits for tasks assessed                      General Comments      Exercises        Assessment/Plan    PT Assessment Patient needs continued PT services  PT Diagnosis Difficulty walking;Abnormality of gait;Generalized weakness   PT Problem List Decreased strength;Decreased activity tolerance;Decreased balance;Decreased mobility;Decreased knowledge of use of DME  PT Treatment Interventions DME instruction;Gait training;Functional mobility training;Therapeutic activities;Therapeutic exercise;Patient/family education;Balance training   PT Goals (Current goals can be found in the Care Plan section) Acute Rehab PT Goals Patient Stated Goal: did not state  PT Goal Formulation: With patient Time For Goal Achievement: 05/04/13 Potential to Achieve Goals: Good    Frequency Min 3X/week   Barriers to discharge        Co-evaluation               End of Session Equipment Utilized During Treatment: Gait belt Activity Tolerance: Patient tolerated treatment well Patient left: in chair;with call bell/phone within reach;with chair alarm set           Time: [id number] PT Time Calculation (min): 19 min   Charges:   PT Evaluation $Initial PT Evaluation Tier I: 1 Procedure PT Treatments $Gait Training: 8-22 mins   PT G Codes:           Rebeca AlertJannie Kiasha Bellin, MPT Pager: [phone]

## 2013-04-20 NOTE — Care Management Note (Addendum)
    Page 1 of 2   04/23/2013     1:57:02 PM   CARE MANAGEMENT NOTE 04/23/2013  Patient:  Manuel Higgins,Manuel Higgins   Account Number:  [id number]  Date Initiated:  04/20/2013  Documentation initiated by:  Lanier ClamMAHABIR,Agness Sibrian  Subjective/Objective Assessment:   63 Y/O M ADMITTED W/DKA.     Action/Plan:   HOMELESS.STATES HE HAS PCP-DR. [name] [name].   Anticipated DC Date:  04/23/2013   Anticipated DC Plan:  SKILLED NURSING FACILITY  In-[facility] referral  Financial Counselor      DC Planning Services  CM consult  MATCH Program      Choice offered to / List presented to:  C-1 Patient           Status of service:  Completed, signed off Medicare Important Message given?   (If response is "NO", the following Medicare IM given date fields will be blank) Date Medicare IM given:   Date Additional Medicare IM given:    Discharge Disposition:  SKILLED NURSING FACILITY  Per UR Regulation:  Reviewed for med. necessity/level of care/duration of stay  If discussed at Long Length of Stay Meetings, dates discussed:   04/22/2013    Comments:  04/23/13 [facility] Rick[facility] Westra RN,BSN NCM [phone] D/C SNF.  04/21/13 12:55 CM called Manuel Higgins to confirm residency of pt. " Manuel Higgins " of Manuel Higgins states pt was a resident but is no longer a resident and, unfortunately, will not be allowed to be a resident in the future. CM contacted CSW, Tresa EndoKelly and made aware of homeless situation.  No other CM needs were communicated.  Manuel JakschSarah Higgins, BSN, CM [phone].   04/20/13 Manuel Wingard RN,BSN NCM [phone] PT-HH.WILL PROVIDE HHC AGENCY LIST,PRIVATE SITTER LIST.MAY NEED MATCH PROGRAM W/OVERRIDE SINCE UNABLE TO AFFORD $3 CO PAY.PATIENT HAS GLUCOMETER.

## 2013-04-21 DIAGNOSIS — E119 Type 2 diabetes mellitus without complications: Secondary | ICD-10-CM

## 2013-04-21 LAB — BASIC METABOLIC PANEL
BUN: 25 mg/dL — AB (ref 6–23)
CO2: 26 mEq/L (ref 19–32)
Calcium: 8.3 mg/dL — ABNORMAL LOW (ref 8.4–10.5)
Chloride: 104 mEq/L (ref 96–112)
Creatinine, Ser: 1.01 mg/dL (ref 0.50–1.35)
GFR calc Af Amer: >90 mL/min (ref >90)
GFR, EST NON AFRICAN AMERICAN: 78 mL/min — AB (ref >90)
Glucose, Bld: 199 mg/dL — ABNORMAL HIGH (ref 70–99)
Potassium: 3.4 mEq/L — ABNORMAL LOW (ref 3.7–5.3)
Sodium: 140 mEq/L (ref 137–147)

## 2013-04-21 LAB — IRON AND TIBC
Iron: 36 ug/dL — ABNORMAL LOW (ref 42–135)
Saturation Ratios: 19 % — ABNORMAL LOW (ref 20–55)
TIBC: 191 ug/dL — ABNORMAL LOW (ref 215–435)
UIBC: 155 ug/dL (ref 125–400)

## 2013-04-21 LAB — RETICULOCYTES
RBC.: 3.37 MIL/uL — ABNORMAL LOW (ref 4.22–5.81)
RETIC CT PCT: 1.3 % (ref 0.4–3.1)
Retic Count, Absolute: 43.8 10*3/uL (ref 19.0–186.0)

## 2013-04-21 LAB — CBC
HCT: 32.2 % — ABNORMAL LOW (ref 39.0–52.0)
Hemoglobin: 10.5 g/dL — ABNORMAL LOW (ref 13.0–17.0)
MCH: 31.2 pg (ref 26.0–34.0)
MCHC: 32.6 g/dL (ref 30.0–36.0)
MCV: 95.5 fL (ref 78.0–100.0)
Platelets: 146 10*3/uL — ABNORMAL LOW (ref 150–400)
RBC: 3.37 MIL/uL — ABNORMAL LOW (ref 4.22–5.81)
RDW: 12.4 % (ref 11.5–15.5)
WBC: 12.6 10*3/uL — ABNORMAL HIGH (ref 4.0–10.5)

## 2013-04-21 LAB — FERRITIN: FERRITIN: 368 ng/mL — AB (ref 22–322)

## 2013-04-21 LAB — FOLATE: Folate: 12.1 ng/mL

## 2013-04-21 LAB — GLUCOSE, CAPILLARY
GLUCOSE-CAPILLARY: 177 mg/dL — AB (ref 70–99)
GLUCOSE-CAPILLARY: 242 mg/dL — AB (ref 70–99)
GLUCOSE-CAPILLARY: 393 mg/dL — AB (ref 70–99)
GLUCOSE-CAPILLARY: 147 mg/dL — AB (ref 70–99)
Glucose-Capillary: 174 mg/dL — ABNORMAL HIGH (ref 70–99)

## 2013-04-21 LAB — MRSA CULTURE

## 2013-04-21 LAB — VITAMIN B12: Vitamin B-12: 459 pg/mL (ref 211–911)

## 2013-04-21 MED ORDER — GLUCERNA SHAKE PO LIQD
237.0000 mL | Freq: Three times a day (TID) | ORAL | Status: DC
  Administered 2013-04-21 – 2013-04-23 (×6): 237 mL via ORAL
  Filled 2013-04-21 (×9): qty 237

## 2013-04-21 MED ORDER — POTASSIUM CHLORIDE CRYS ER 20 MEQ PO TBCR
40.0000 meq | EXTENDED_RELEASE_TABLET | Freq: Two times a day (BID) | ORAL | Status: AC
  Administered 2013-04-21 (×2): 40 meq via ORAL
  Filled 2013-04-21 (×2): qty 2

## 2013-04-21 MED ORDER — CLONIDINE HCL 0.2 MG/24HR TD PTWK
0.2000 mg | MEDICATED_PATCH | TRANSDERMAL | Status: DC
  Filled 2013-04-21: qty 1

## 2013-04-21 NOTE — Progress Notes (Addendum)
[facility] PROGRESS NOTE  Creed Kail ZOX:[id number] DOB: 12/09/1950 DOA: 04/18/2013 PCP: Bernerd Limbo Interim Summary: Magic Mohler is a 63 y.o. male with a history of multiple acute and subacute small vessel cerebral infarcts in March 2015, hypertension, and diabetes mellitus, who presents with a report of confusion. The patient is currently confused and the history is not reliable. The history, in part, was provided by the ED physician and staff. AAccording to the ED physician, the patient told her in the registered nurse that he had missed a few doses of insulin and that his blood glucose was reading "high". Apparently, he is a resident at halfway home, the [facility] house. No family or acquaintances are available for questioning. In the ED, he was hypertensive with a blood pressure ranging from 170-190 and diastolic blood pressure ranging from 78-120. His lab data are significant for a venous glucose of 964, BUN of 50, creatinine of 2.0, WBC of 12.4, pH of 7.3, PCO2 of 46, and PO2 of 115 on oxygen. His anion gap is 20. He is being admitted for further evaluation and management. He was admitted to stepdown and was started on glucose stabilizer. As his AG closed andhis cbg's improved he was transitioned to sq lantus and SSI and transferred to the telemetry. Over the next 24 hours he remained confused and he might have a component of dementia too. He had a loop recorder inserted last admission and he failed to follow up with Dr Ladona Ridgel on 4/2. We have requested EP/ cardiology to look in to the loop recorder on 4/6. As per the notes he was also enrolled in the POINT trial during the previous hospitalization , but his research RN, has reported that he never took any medications.  He was also diagnosed with cellulitis and possible osteo based on the MRI findings, orthopedics was consulted and , recommended antibiotic use, local wound care and wound care follow up as outpatient.      Assessment/Plan:  1. Diabetic Ketoacidosis: he was initially admitted to stepdown and  was started on IV glucose, and transitioned to lantus . Marland Kitchen His hemoglobin A1c was 11.4 on 04/05/13. He will need close follow up with PCP.  2. Acute encephalopathy: improved.  This is likely the result of uncontrolled diabetes, but will consider hypertensive encephalopathy and residual effects of recent stroke. Ct HEAD does not reveal new strokes.  3. 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 He was enrolled in the POINT trial during the previous hospitalization.but he never took any medications as per Radio producer.  4. Status post loop recorder insertion. Loop recorder was inserted during the previous hospitalization for workup of cerebral multi-infarcts. The patient does not know when he is to followup with cardiologist Dr. Lewayne Bunting. Requested cardiology to see pt while in hospitalization. 5. Acute renal failure, likely secondary to prerenal azotemia/dehydration : on fluids with improvement in renal function.  6. Malignant hypertension.: much better controlled.   7. Query left great toe cellulitis/osteomyelitis. The patient was discharged on Cipro and doxycycline from the previous hospitalization over a week ago. Per exam, there does not appear to be any active evidence of cellulitis or abscess. Will, however, continue doxycycline for one more week. The patient was supposed to be scheduled for wound care consult as an outpatient. Wound care consulted.  8. Psychosocial environment. Will ask the social worker or case manager to investigate the patient's support system and his ability to followup with appropriate medical followups and compliance with medical therapy. 9.  Persistent Leukocytosis: CXR and UA is negative for infection. He is not having diarrhea. Continue to monitor.  10. Anemia: normocytic and stable around 10.5. Ferritin and b12 and folate levels are within normal limits.   11. Hypokalemia: replete as needed.  12. Dysphagia: unclear etiology, probably from the multiple infarcts. Swallow evaluation obtained because of his lethargy, recommended full liquid diet for now.  13. Hypernatremia: resolved with dextrose fluids.  14. DVT prophylaxis. SCD'S Code Status: Full code  Family Communication: No family available  Disposition Plan: back to halfway home when stable with home health PT.    Consultants:  Cardiology / EP   Wound care  Procedures: CT HEAD without contrast.   Antibiotics:  Doxycycline for 5 more days.   HPI/Subjective: He is more alert today and conversing.    Objective: Filed Vitals:   04/21/13 1023  BP: 94/45  Pulse:   Temp:   Resp:     Intake/Output Summary (Last 24 hours) at 04/21/13 1144 Last data filed at 04/21/13 1000  Gross per 24 hour  Intake 3641.25 ml  Output    550 ml  Net 3091.25 ml   Filed Weights   04/19/13 1937 04/20/13 0500 04/21/13 0600  Weight: 74.1 kg (163 lb 5.8 oz) 74.1 kg (163 lb 5.8 oz) 76 kg (167 lb 8.8 oz)    Exam:   General:  Alert afebrile comfortable.   Cardiovascular: s1s2  Respiratory: ctab  Abdomen: soft NT ND BS+  Musculoskeletal: NOpedal edema.   Data Reviewed: Basic Metabolic Panel:  Recent Labs Lab 04/19/13 0324 04/19/13 0559 04/19/13 0956 04/20/13 0338 04/21/13 0427  NA 160* 162* 154* 152* 140  K 3.8 3.9 4.3 3.6* 3.4*  CL 122* 123* 118* 116* 104  CO2 26 27 26 28 26   GLUCOSE 261* 128* 190* 131* 199*  BUN 42* 40* 37* 29* 25*  CREATININE 1.40* 1.33 1.31 1.15 1.01  CALCIUM 9.3 9.5 9.4 8.9 8.3*   Liver Function Tests:  Recent Labs Lab 04/18/13 2000  AST 12  ALT 12  ALKPHOS 170*  BILITOT <0.2*  PROT 7.6  ALBUMIN 4.1   No results found for this basename: LIPASE, AMYLASE,  in the last 168 hours No results found for this basename: AMMONIA,  in the last 168 hours CBC:  Recent Labs Lab 04/18/13 0039 04/18/13 2000 04/19/13 0556 04/20/13 0338  04/21/13 0427  WBC 15.0* 12.4* 16.2* 13.0* 12.6*  HGB 11.4* 12.0* 11.1* 10.8* 10.5*  HCT 34.3* 37.4* 34.1* 33.4* 32.2*  MCV 92.7 96.1 94.5 95.2 95.5  PLT 252 254 229 212 146*   Cardiac Enzymes: No results found for this basename: CKTOTAL, CKMB, CKMBINDEX, TROPONINI,  in the last 168 hours BNP (last 3 results) No results found for this basename: PROBNP,  in the last 8760 hours CBG:  Recent Labs Lab 04/20/13 1646 04/20/13 2018 04/20/13 2357 04/21/13 0414 04/21/13 0744  GLUCAP 276* 294* 111* 177* 174*    Recent Results (from the past 240 hour(s))  MRSA PCR SCREENING     Status: Abnormal   Collection Time    04/18/13 10:49 PM      Result Value Ref Range Status   MRSA by PCR INVALID RESULTS, SPECIMEN SENT FOR CULTURE (*) NEGATIVE Final   Comment: GGAARLAND RN AT 0100 ON [id number]                The GeneXpert MRSA Assay (FDA     approved for NASAL specimens     only), is one component  of a     comprehensive MRSA colonization     surveillance program. It is not     intended to diagnose MRSA     infection nor to guide or     monitor treatment for     MRSA infections.  MRSA CULTURE     Status: None   Collection Time    04/18/13 11:00 PM      Result Value Ref Range Status   Specimen Description NOSE   Final   Special Requests NONE   Final   Culture     Final   Value: NO STAPHYLOCOCCUS AUREUS ISOLATED     Note: No MRSA Isolated     Performed at [facility]   Report Status 04/21/2013 FINAL   Final     Studies: No results found.  Scheduled Meds: . aspirin  325 mg Oral Daily  . cloNIDine  0.2 mg Transdermal Weekly  . doxycycline  100 mg Oral BID  . feeding supplement (GLUCERNA SHAKE)  237 mL Oral TID BM  . hydrALAZINE  25 mg Oral 3 times per day  . insulin aspart  0-20 Units Subcutaneous Q4H  . insulin glargine  20 Units Subcutaneous Daily  . lisinopril  20 mg Oral Daily  . potassium chloride  40 mEq Oral BID  . simvastatin  10 mg Oral QHS   Continuous  Infusions:    Principal Problem:   DKA (diabetic ketoacidoses) Active Problems:   Diabetes   CVA (cerebral infarction)   Acute encephalopathy   Acute renal failure   HTN (hypertension), malignant    Time spent: 35 min    Lonnette Shrode  [facility] Pager [phone] If 7PM-7AM, please contact night-coverage at [email or web address], password [email or web address] 04/21/2013, 11:44 AM  LOS: 3 days

## 2013-04-21 NOTE — Progress Notes (Signed)
Speech Language Pathology Treatment: Dysphagia  Patient Details Name: Manuel Higgins MRN: [id number] DOB: 07/30/1950 Today's Date: 04/21/2013 Time: [id number] SLP Time Calculation (min): 13 min  Assessment / Plan / Recommendation Clinical Impression  Pt with improved tolerance of po today compared to over the weekend.  No indication of aspiration/airway compromise and swallow was timely.  Fairly slow mastication ability with solids noted.  Pt does report sensation of pharyngeal stasis with solids but this effectively clears with consumption of liquids.  Mr Manuel Higgins admits that he has sensation of food lodging in throat frequently even prior to admission- suspect his swallow is near or at baseline level.  He denies weight loss nor pulmonary infections - therefore appears to be tolerating intake.    Recommend advance diet to Dys3/thin with intermittent supervision.    HPI HPI: 63 year old male with with history of hypertension, insulin-dependent diabetes mellitus admitted with DKA.  Pt with h/o CVA.  Swallow evaluation completed Saturday, today pt seen to assess readiness for dietary advancement.     Pertinent Vitals Afebrile, decreased  SLP Plan  Continue with current plan of care    Recommendations Diet recommendations: Dysphagia 3 (mechanical soft);Thin liquid Liquids provided via: Cup;Straw Medication Administration: Whole meds with liquid Supervision: Patient able to self feed;Intermittent supervision to cue for compensatory strategies Compensations: Slow rate;Small sips/bites (consume liquids throughout meal) Postural Changes and/or Swallow Maneuvers: Out of bed for meals;Seated upright 90 degrees;Upright 30-60 min after meal              Oral Care Recommendations: Oral care BID Follow up Recommendations:  (tbd) Plan: Continue with current plan of care    GO     Manuel Burnetamara Roxsana Riding, MS [facility] SLP [phone]

## 2013-04-21 NOTE — Progress Notes (Signed)
Physical Therapy Treatment Patient Details Name: Manuel Higgins MRN: [id number] DOB: 01/24/1950 Today's Date: 04/21/2013    History of Present Illness 63 yo male admitted with DKA, AMS. Hx of CVA, HTN, DM. Pt is homeless    PT Comments    Pt requiring increased assistance this session. Now requires Min assist and use of walker. Noted pt leaning posteriorly especially with static standing. Also noted L lateral lean (moreso head and trunk) with walking. At this time, recommending SNF.   Follow Up Recommendations  SNF;Supervision/Assistance - 24 hour     Equipment Recommendations  Rolling walker with 5" wheels (possibly)    Recommendations for Other Services       Precautions / Restrictions Precautions Precautions: Fall Precaution Comments: [facility] (via case manager), pt is unsteady on his feet.  Restrictions Weight Bearing Restrictions: No    Mobility  Bed Mobility                  Transfers Overall transfer level: Needs assistance Equipment used: Rolling walker (2 wheeled) Transfers: Sit to/from Stand Sit to Stand: Min assist         General transfer comment: Assist to rise, stabilize, control descent. VCs safety, technique, hand placement. LOB posteriorly in static standing. Facilitation at posterior hip area and sternum to encourage full trunk extension.   Ambulation/Gait Ambulation/Gait assistance: Min assist Ambulation Distance (Feet): 100 Feet Assistive device: Rolling walker (2 wheeled) Gait Pattern/deviations: Trunk flexed;Step-through pattern;Decreased stride length     General Gait Details: Assist to stabilize pt and maneuver with RW. slow gait speed. Unsteady.    Stairs            Wheelchair Mobility    Modified Rankin (Stroke Patients Only)       Balance         Postural control: Posterior lean Standing balance support: Bilateral upper extremity supported;During functional activity Standing balance-Leahy Scale: Poor                       Cognition Arousal/Alertness: Awake/alert Behavior During Therapy: Flat affect Overall Cognitive Status: Difficult to assess Area of Impairment: Orientation;Memory;Awareness Orientation Level: Time;Situation   Memory: Decreased short-term memory   Safety/Judgement: Decreased awareness of safety;Decreased awareness of deficits     General Comments: Pt stated "6th" when asked what month it is. Pt not able to state month. Able to state year with min cues. Pt not able to state reason he is in the hospital. Much difficulty with donning socks and when asked if this task is difficult for him today, he states "no."     Exercises      General Comments        Pertinent Vitals/Pain Pt denies pain    Home Living Family/patient expects to be discharged to:: Group home Living Arrangements: Other (Comment) ([facility])   Type of Home: House       Home Equipment: None      Prior Function        Comments: Pt could perform self care.  He is required to make up his bed and keep his personal area clean, but not to do cooking or cleaning otherwise.   PT Goals (current goals can now be found in the care plan section) Acute Rehab PT Goals Patient Stated Goal: did not state Progress towards PT goals: Progressing toward goals    Frequency  Min 3X/week    PT Plan Current plan remains appropriate    Co-evaluation  End of Session Equipment Utilized During Treatment: Gait belt Activity Tolerance: Patient tolerated treatment well Patient left: in chair;with call bell/phone within reach;with chair alarm set     Time: 7829-5621 PT Time Calculation (min): 14 min  Charges:  $Gait Training: 8-22 mins                    G Codes:       Rebeca Alert, MPT Pager: [phone] \

## 2013-04-21 NOTE — Progress Notes (Signed)
INITIAL NUTRITION ASSESSMENT  DOCUMENTATION CODES Per approved criteria  -Not Applicable   INTERVENTION: - Diet advancement per MD/SLP - Glucerna shakes TID - Will continue to monitor   NUTRITION DIAGNOSIS: Swallowing difficulty related to CVA as evidenced by SLP notes.    Goal: Pt to consume >90% of meals/supplements  Monitor:  Weights, labs, intake, diet advancement   Reason for Assessment: Malnutrition screening tool   63 y.o. male  Admitting Dx: DKA (diabetic ketoacidoses)  ASSESSMENT: Admitted with AMS and hyperglycemia from homeless shelter. 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 Followed by SLP who noted pt with moderate aspiration risk. Met with pt who reports eating 3 meals/day at the shelter with good appetite. Weight down 5 pounds unintentionally in the past 3 months. Agreeable to getting Ensure Complete. Pt appeared delayed in his responses, unsure how much pt is aware of his condition and ability to comply with medical therapy once d/c. Has been eating 75-100% of full liquid meals. Denies any diabetic diet nutrition educational needs.   Potassium low, getting oral replacement Alk phos elevated  Lab Results  Component Value Date   HGBA1C 11.4* 04/05/2013    Height: Ht Readings from Last 1 Encounters:  04/19/13 5' 9"  (1.753 m)    Weight: Wt Readings from Last 1 Encounters:  04/21/13 167 lb 8.8 oz (76 kg)    Ideal Body Weight: 160 lbs  % Ideal Body Weight: 104%  Wt Readings from Last 10 Encounters:  04/21/13 167 lb 8.8 oz (76 kg)  04/09/13 165 lb 9.1 oz (75.1 kg)  04/09/13 165 lb 9.1 oz (75.1 kg)  04/09/13 165 lb 9.1 oz (75.1 kg)  02/15/13 172 lb 6.4 oz (78.2 kg)    Usual Body Weight: 172 lbs per pt  % Usual Body Weight: 97%  BMI:  Body mass index is 24.73 kg/(m^2).  Estimated Nutritional Needs: Kcal: 1900-2100 Protein: 90-100g Fluid: 1.9-2.1L/day  Skin:  Diabetic ulcer on left toe  Diet Order: Full Liquid  EDUCATION NEEDS: -No education needs identified at this time   Intake/Output Summary (Last 24 hours) at 04/21/13 0954 Last data filed at 04/21/13 0909  Gross per 24 hour  Intake 3281.25 ml  Output    550 ml  Net 2731.25 ml    Last BM: 4/4  Labs:   Recent Labs Lab 04/19/13 0956 04/20/13 0338 04/21/13 0427  NA 154* 152* 140  K 4.3 3.6* 3.4*  CL 118* 116* 104  CO2 26 28 26   BUN 37* 29* 25*  CREATININE 1.31 1.15 1.01  CALCIUM 9.4 8.9 8.3*  GLUCOSE 190* 131* 199*    CBG (last 3)   Recent Labs  04/20/13 2357 04/21/13 0414 04/21/13 0744  GLUCAP 111* 177* 174*    Scheduled Meds: . amLODipine  10 mg Oral Daily  . aspirin  325 mg Oral Daily  . cloNIDine  0.2 mg Transdermal Weekly  . doxycycline  100 mg Oral BID  . hydrALAZINE  25 mg Oral 3 times per day  . insulin aspart  0-20 Units Subcutaneous Q4H  . insulin glargine  20 Units Subcutaneous Daily  . lisinopril  20 mg Oral Daily  . potassium chloride  40 mEq Oral BID  . simvastatin  10 mg Oral QHS    Continuous Infusions:   Past Medical History  Diagnosis Date  . Hypertension   . Diabetes mellitus without complication   . Stroke 03/2013    Multiple infarcts  .  Hypertensive crisis 02/15/2013  . Toe ulcer     Past Surgical History  Procedure Laterality Date  . Back surgery    . Tonsillectomy    . Tee without cardioversion N/A 04/07/2013    Procedure: TRANSESOPHAGEAL ECHOCARDIOGRAM (TEE);  Surgeon: Dorothy Spark, MD;  Location: [facility];  Service: Cardiovascular;  Laterality: N/A;  . Loop recorder implant  04/08/2013    MDT LinQ implanted by Dr Lovena Le for cryptogenic stroke    Mikey College MS, [place], [place] Pager [phone] After Hours Pager

## 2013-04-21 NOTE — Evaluation (Signed)
Occupational Therapy Evaluation Patient Details Name: Devone Tousley MRN: [id number] DOB: 1950-12-22 Today's Date: 04/21/2013    History of Present Illness 63 yo male admitted with DKA, AMS. Hx of CVA, HTN, DM. Pt is from homeless shelter   Clinical Impression   Pt presents with flat affect and decreased awareness of his deficits at this time. He is unsteady on his feet in standing and requires mod to max assist with LB self care and min assist for standing with walker. Would want +2 for transferring into bathroom for safety. Will follow for acute OT to improve safety and independence with ADL. Do not feel pt can return to [facility] at his current level.    Follow Up Recommendations  SNF;Supervision/Assistance - 24 hour    Equipment Recommendations  None recommended by OT    Recommendations for Other Services       Precautions / Restrictions Precautions Precautions: Fall Precaution Comments: per [facility] (via case manager), pt is unsteady on his feet.       Mobility Bed Mobility                  Transfers Overall transfer level: Needs assistance Equipment used: Rolling walker (2 wheeled) Transfers: Sit to/from Stand Sit to Stand: Min assist         General transfer comment: min assist to rise and steady, min to help facilitate reaching back for chair and controlling descent.    Balance                                            ADL Overall ADL's : Needs assistance/impaired     Grooming: Wash/dry face;Set up;Sitting   Upper Body Bathing: Supervision/ safety;Set up;Sitting   Lower Body Bathing: Moderate assistance;Sit to/from stand   Upper Body Dressing : Minimal assistance;Sitting   Lower Body Dressing: Maximal assistance;Sit to/from stand   Toilet Transfer: Minimal assistance Toilet Transfer Details (indicate cue type and reason): sit to stand only with +1 assist. Would want +2 to step away from recliner to the  bathroom. Toileting- Clothing Manipulation and Hygiene: Moderate assistance;Sit to/from stand       Functional mobility during ADLs: Rolling walker General ADL Comments: Pt with very flat affect. Pt's crossed L LE up to his opposite knee to doff sock and leg kept slipping off other LE. He needed assist to hold L LE into position to doff sock. When asked if pt had difficulty with donning and dofffing socks today after repeated attempts to do so unsuccessfully , pt stating "no." Pt leaning heavily posteriorly in standing and taking small steps forward and backward. Pt able to state how to use call button for assist.      Vision                     Perception     Praxis      Pertinent Vitals/Pain No complaint of     Hand Dominance     Extremity/Trunk Assessment Upper Extremity Assessment Upper Extremity Assessment: RUE deficits/detail;LUE deficits/detail RUE Deficits / Details: only about 90 degrees shoulder flexion; PROM WFL LUE Deficits / Details: only about 90 degrees shoulder flexion, PROM WFl. pt with difficulty using hands to manipulate socks (ie open sock to don around foot)           Communication Communication Communication: No difficulties  Cognition Arousal/Alertness: Awake/alert Behavior During Therapy: Flat affect Overall Cognitive Status: Difficult to assess Area of Impairment: Orientation;Awareness Orientation Level: Time;Situation       Safety/Judgement: Decreased awareness of safety;Decreased awareness of deficits     General Comments: Pt stated "6th" when asked what month it is. Pt not able to state month. Able to state year with min cues. Pt not able to state reason he is in the hospital. Much difficulty with donning socks and when asked if this task is difficult for him today, he states "no."    General Comments       Exercises       Shoulder Instructions      Home Living Family/patient expects to be discharged to:: Group home Living  Arrangements: Other (Comment) ([facility])   Type of Home: House                       Home Equipment: None          Prior Functioning/Environment          Comments: Pt could perform self care.  He is required to make up his bed and keep his personal area clean, but not to do cooking or cleaning otherwise.    OT Diagnosis: Generalized weakness   OT Problem List: Decreased strength;Decreased knowledge of use of DME or AE;Decreased cognition;Impaired balance (sitting and/or standing)   OT Treatment/Interventions: Self-care/ADL training;Patient/family education;Therapeutic activities;DME and/or AE instruction    OT Goals(Current goals can be found in the care plan section) Acute Rehab OT Goals Patient Stated Goal: did not state OT Goal Formulation: With patient Time For Goal Achievement: 05/05/13 Potential to Achieve Goals: Good  OT Frequency: Min 2X/week   Barriers to D/C:            Co-evaluation              End of Session Equipment Utilized During Treatment: Gait belt;Rolling walker  Activity Tolerance: Patient tolerated treatment well Patient left: in chair;with call bell/phone within reach;with chair alarm set   Time: 1135-1200 OT Time Calculation (min): 25 min Charges:  OT General Charges $OT Visit: 1 Procedure OT Evaluation $Initial OT Evaluation Tier I: 1 Procedure OT Treatments $Self Care/Home Management : 8-22 mins G-Codes:    Lennox LaityStone, Keriana Sarsfield Stafford [phone] 04/21/2013, 12:14 PM

## 2013-04-22 ENCOUNTER — Encounter: Payer: Self-pay | Admitting: Internal Medicine

## 2013-04-22 LAB — GLUCOSE, CAPILLARY
GLUCOSE-CAPILLARY: 143 mg/dL — AB (ref 70–99)
GLUCOSE-CAPILLARY: 265 mg/dL — AB (ref 70–99)
Glucose-Capillary: 145 mg/dL — ABNORMAL HIGH (ref 70–99)
Glucose-Capillary: 191 mg/dL — ABNORMAL HIGH (ref 70–99)
Glucose-Capillary: 243 mg/dL — ABNORMAL HIGH (ref 70–99)
Glucose-Capillary: 250 mg/dL — ABNORMAL HIGH (ref 70–99)
Glucose-Capillary: 97 mg/dL (ref 70–99)

## 2013-04-22 NOTE — Progress Notes (Signed)
Speech Language Pathology Treatment: Dysphagia  Patient Details Name: Manuel Higgins MRN: [id number] DOB: 10/25/1950 Today's Date: 04/22/2013 Time: 1505-1524 SLP Time Calculation (min): 19 min  Assessment / Plan / Recommendation Clinical Impression  Pt with suspected resolution of swallow ability to baseline status.  No clinical indications of aspiration or compromised airway with 4 ounces icecream, 4 graham cracker and 2 ounces of water.  Pt consumed po at slow pace, masticating thoroughly without cues.  He now denies premorbid symptoms of esophageal stasis (he had reported symptoms yesterday).    Recommend continue modified diet due to pt's lack of lower dentition *dentures not in hospital*.  Pt able to verbalize/teach back aspiration precautions but given cognition, uncertain if he will generalize skills, thus recommend continue intermittent supervision.  SLP to sign off as all education is completed with this pt and he appears to be tolerating his po diet.    HPI HPI: 63-year-old male with with history of hypertension, insulin-dependent diabetes mellitus admitted with DKA.  Pt with h/o CVA recently - March 2015.  Diet advanced yesterday and today pt seen to assess po advancement tolerance.     Pertinent Vitals Low grade temperature,  decreased   SLP Plan  All goals met    Recommendations Liquids provided via: Cup;Straw Medication Administration: Whole meds with liquid Supervision: Patient able to self feed;Intermittent supervision to cue for compensatory strategies Compensations: Slow rate;Small sips/bites (drink liquids throughout meal) Postural Changes and/or Swallow Maneuvers: Out of bed for meals;Seated upright 90 degrees;Upright 30-60 min after meal              Oral Care Recommendations: Oral care BID Follow up Recommendations: None Plan: All goals met    GO      , MS CCC SLP [phone]    

## 2013-04-22 NOTE — Progress Notes (Signed)
Clinical Social Work Department BRIEF PSYCHOSOCIAL ASSESSMENT 04/22/2013  Patient:  Manuel Higgins,Manuel Higgins     Account Number:  [id number]     Admit date:  04/18/2013  Clinical Social Worker:  Orpah GreekFOLEY,Nihira Puello, LCSWA  Date/Time:  04/22/2013 03:46 PM  Referred by:  Physician  Date Referred:  04/22/2013 Referred for  SNF Placement   Other Referral:   Interview type:  Patient Other interview type:   and sister, Elease Hashimotoatricia via phone    PSYCHOSOCIAL DATA Living Status:  ALONE Admitted from facility:   Level of care:   Primary support name:  Theresia Loatricia Smith (sister) ph#: [phone] Primary support relationship to patient:  SIBLING Degree of support available:   fair - [place] in [place].    Patient has another sister, Vergia AlconBertha Lilly (work ph#: [phone]) & a brother, Berlin HunLarry Lacerda though per Elease HashimotoPatricia, he is blind.    CURRENT CONCERNS Current Concerns  Post-Acute Placement   Other Concerns:    SOCIAL WORK ASSESSMENT / PLAN CSW reviewed patient's chart indicating [facility].   Assessment/plan status:  Information/Referral to [facility] Other assessment/ plan:   Information/referral to community resources:   CSW completed FL[facility] and [facility] & encouraged sister to submit medicaid application for him in order for CSW to proceed with SNF placement.    PATIENT'S/FAMILY'S RESPONSE TO PLAN OF CARE: CSW called & spoke with [facility]ph @ [facility] (ph#: [phone]) who informed CSW that patient had discharged from there about 2 weeks ago and went to a shelter. Patient did not complete the 9 month program.    CSW then attempted to speak with patient who is confused, told CSW that he though he was in [place]. CSW contacted patient's 61 year old sister, Elease Hashimotoatricia (ph#: [phone]) who informed CSW that patient had been living at [facility] in [place] until he exhausted his days/stay there and [facility] but then left and [facility].    Sister states that she is unable to care for patient due to her own morbities and that she recently went back to work herself due to family issues. Sister states that they've been hoping he would be able to go to a rehab facility and were concerned about his recent stroke. CSW and financial counselor, Jasmine DecemberSharon encouraged patient's sister to go to DSS & complete a Medicaid application for him so the hospital could proceed with SNF placement.       Lincoln MaxinKelly Kentarius Partington, LCSW [facility] Clinical Social Worker cell #: [phone]

## 2013-04-22 NOTE — Discharge Summary (Signed)
Physician Discharge Summary  Manuel Higgins ZOX:[id number] DOB: 1950/02/11 DOA: 04/18/2013  PCP: Bernerd Limbo  Admit date: 04/18/2013 Discharge date: 04/22/2013  Time spent: 30 minutes  Recommendations for Outpatient Follow-up:  1. Follow up with PCP in one week 2. Follow up with podiatry on discharge 3. Follow u pwith CBC and BMP in one week.  4. Follow up with neurology as recommended.  5. Follow up with cardiology in one week, with Dr Ladona Ridgel.   Discharge Diagnoses:  Principal Problem:   DKA (diabetic ketoacidoses) Active Problems:   Diabetes   CVA (cerebral infarction)   Acute encephalopathy   Acute renal failure   HTN (hypertension), malignant   Discharge Condition: improved  Diet recommendation: diabetic diet/ soft diet.   Filed Weights   04/20/13 0500 04/21/13 0600 04/22/13 0410  Weight: 74.1 kg (163 lb 5.8 oz) 76 kg (167 lb 8.8 oz) 76.5 kg (168 lb 10.4 oz)    History of present illness:  Manuel Higgins is a 63 y.o. male with a history of multiple acute and subacute small vessel cerebral infarcts in March 2015, hypertension, and diabetes mellitus, who presents with a report of confusion. The patient is currently confused and the history is not reliable. The history, in part, was provided by the ED physician and staff. AAccording to the ED physician, the patient told her in the registered nurse that he had missed a few doses of insulin and that his blood glucose was reading "high". Apparently, he is a resident at halfway home, the [facility] house. No family or acquaintances are available for questioning. In the ED, he was hypertensive with a blood pressure ranging from 170-190 and diastolic blood pressure ranging from 78-120. His lab data are significant for a venous glucose of 964, BUN of 50, creatinine of 2.0, WBC of 12.4, pH of 7.3, PCO2 of 46, and PO2 of 115 on oxygen. His anion gap is 20. He is being admitted for further evaluation and management. He was admitted to stepdown and  was started on glucose stabilizer. As his AG closed andhis cbg's improved he was transitioned to sq lantus and SSI and transferred to the telemetry. Over the next 24 hours he remained confused and he might have a component of dementia too. He had a loop recorder inserted last admission and he failed to follow up with Dr Ladona Ridgel on 4/2. We have requested EP/ cardiology to look in to the loop recorder on 4/6. As per the notes he was also enrolled in the POINT trial during the previous hospitalization , but his research RN, has reported that he never took any medications. He was also diagnosed with cellulitis and possible osteo based on the MRI findings, orthopedics was consulted and , recommended antibiotic use, local wound care and wound care follow up as outpatient.    Hospital Course:  Diabetic Ketoacidosis: he was initially admitted to stepdown and was started on IV glucose, and transitioned to lantus . Marland Kitchen His hemoglobin A1c was 11.4 on 04/05/13. He will need close follow up with PCP.    Acute encephalopathy: improved. This is likely the result of uncontrolled diabetes, but will consider hypertensive encephalopathy and residual effects of recent stroke. Ct HEAD does not reveal new strokes. 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 He was enrolled in the POINT trial during the previous hospitalization.but he never took any medications as per Radio producer. Status post loop recorder insertion. Loop recorder was inserted during the previous hospitalization for workup of cerebral multi-infarcts. The patient  does not know when he is to followup with cardiologist Dr. Lewayne BuntingGregg Taylor. Requested cardiology to see pt while in hospitalization.    Acute renal failure, likely secondary to prerenal azotemia/dehydration : on fluids with improvement in renal function.   Malignant hypertension.: much better controlled.   Query left great toe cellulitis/osteomyelitis. The patient was discharged on Cipro and  doxycycline from the previous hospitalization over a week ago. Per exam, there does not appear to be any active evidence of cellulitis or abscess. Will, however, continue doxycycline for one more week. The patient was supposed to be scheduled for wound care consult as an outpatient. Wound care consulted.   Psychosocial environment. Will ask the social worker or case manager to investigate the patient's support system and his ability to followup with appropriate medical followups and compliance with medical therapy.   Persistent Leukocytosis: CXR and UA is negative for infection. He is not having diarrhea. Continue to monitor.   Anemia: normocytic and stable around 10.5. Ferritin and b12 and folate levels are within normal limits.   Hypokalemia: replete as needed.   Dysphagia: unclear etiology, probably from the multiple infarcts. Swallow evaluation obtained because of his lethargy, recommended full liquid diet for now and advanced to soft diet.    Hypernatremia: resolved with dextrose fluids.    Procedures: none:  Discharge Exam: Filed Vitals:   04/22/13 1439  BP: 123/77  Pulse: 85  Temp: 99.1 F (37.3 C)  Resp: 16    General: Alert afebrile comfortable.  Cardiovascular: s1s2  Respiratory: ctab  Abdomen: soft NT ND BS+  Musculoskeletal: NOpedal edema.      Discharge Instructions You were cared for by a hospitalist during your hospital stay. If you have any questions about your discharge medications or the care you received while you were in the hospital after you are discharged, you can call the unit and asked to speak with the hospitalist on call if the hospitalist that took care of you is not available. Once you are discharged, your primary care physician will handle any further medical issues. Please note that NO REFILLS for any discharge medications will be authorized once you are discharged, as it is imperative that you return to your primary care physician (or establish a  relationship with a primary care physician if you do not have one) for your aftercare needs so that they can reassess your need for medications and monitor your lab values.   Future Appointments Provider Department Dept Phone   07/07/2013 1:00 PM [facility] [phone]       Medication List    ASK your doctor about these medications       amLODipine 10 MG tablet  Commonly known as:  NORVASC  Take 1 tablet (10 mg total) by mouth daily.     aspirin 325 MG tablet  Take 1 tablet (325 mg total) by mouth daily.     atorvastatin 10 MG tablet  Commonly known as:  LIPITOR  Take 10 mg by mouth daily.     ciprofloxacin 500 MG tablet  Commonly known as:  CIPRO  Take 1 tablet (500 mg total) by mouth 2 (two) times daily. X 2 weeks     doxycycline 100 MG tablet  Commonly known as:  VIBRA-TABS  Take 1 tablet (100 mg total) by mouth 2 (two) times daily. X 2 weeks     glipiZIDE 10 MG tablet  Commonly known as:  GLUCOTROL  Take 10 mg by mouth daily before  breakfast.     hydrALAZINE 25 MG tablet  Commonly known as:  APRESOLINE  Take 25 mg by mouth 3 (three) times daily.     insulin aspart 100 UNIT/ML injection  Commonly known as:  novoLOG  Inject 0-9 Units into the skin 3 (three) times daily with meals. Sliding scale CBG 70 - 120: 0 units CBG 121 - 150: 1 unit,  CBG 151 - 200: 2 units,  CBG 201 - 250: 3 units,  CBG 251 - 300: 5 units,  CBG 301 - 350: 7 units,  CBG 351 - 400: 9 units   CBG > 400: 9 units and notify your MD     insulin glargine 100 UNIT/ML injection  Commonly known as:  LANTUS  Inject 0.15 mLs (15 Units total) into the skin at bedtime.     insulin lispro protamine-lispro (75-25) 100 UNIT/ML Susp injection  Commonly known as:  HUMALOG 75/25 MIX  Inject 12 Units into the skin daily with breakfast.     lisinopril 10 MG tablet  Commonly known as:  PRINIVIL,ZESTRIL  Take 1 tablet (10 mg total) by mouth daily.     metFORMIN 1000 MG (MOD)  24 hr tablet  Commonly known as:  GLUMETZA  Take 1,000 mg by mouth 2 (two) times daily with a meal.     naphazoline 0.1 % ophthalmic solution  Commonly known as:  NAPHCON  Place 1 drop into both eyes 4 (four) times daily as needed for irritation.     simvastatin 10 MG tablet  Commonly known as:  ZOCOR  Take 1 tablet (10 mg total) by mouth at bedtime.     terbinafine 250 MG tablet  Commonly known as:  LAMISIL  Take 1 tablet (250 mg total) by mouth daily.       No Known Allergies    The results of significant diagnostics from this hospitalization (including imaging, microbiology, ancillary and laboratory) are listed below for reference.    Significant Diagnostic Studies: Dg Chest 2 View  04/18/2013   CLINICAL DATA:  Hyperglycemia  EXAM: CHEST  2 VIEW  COMPARISON:  04/04/2013  FINDINGS: Cardiomediastinal silhouette is stable. No acute infiltrate or pleural effusion. No pulmonary edema. Bony thorax is unremarkable.  IMPRESSION: No active cardiopulmonary disease.   Electronically Signed   By: Natasha Mead M.D.   On: 04/18/2013 20:55   Dg Chest 2 View  04/04/2013   CLINICAL DATA:  Stroke  EXAM: CHEST  2 VIEW  COMPARISON:  12/10/2011.  FINDINGS: Cardiomediastinal silhouette is unremarkable. No acute infiltrate or pleural effusion. No pulmonary edema. Bony thorax is unremarkable.  IMPRESSION: No active cardiopulmonary disease.   Electronically Signed   By: Natasha Mead M.D.   On: 04/04/2013 10:36   Ct Head Wo Contrast  04/19/2013   CLINICAL DATA:  Encephalopathy; recent CVA.  EXAM: CT HEAD WITHOUT CONTRAST  TECHNIQUE: Contiguous axial images were obtained from the base of the skull through the vertex without intravenous contrast.  COMPARISON:  MRI/MRA of the brain, and CT of the head, performed 04/05/2011  FINDINGS: Known scattered small acute infarcts at the corona radiata bilaterally and right body of the corpus callosum are not well seen on CT. There is no evidence of acute infarction, mass  lesion, or intra- or extra-axial hemorrhage on CT.  Scattered periventricular white matter change likely reflects small vessel ischemic microangiopathy. Small chronic lacunar infarcts are seen at the cerebellar hemispheres bilaterally.  The brainstem and fourth ventricle are within normal limits. The  third and lateral ventricles, and basal ganglia are unremarkable in appearance. The cerebral hemispheres demonstrate grossly normal gray-white differentiation. No mass effect or midline shift is seen.  There is no evidence of fracture; visualized osseous structures are unremarkable in appearance. The orbits are within normal limits. The paranasal sinuses and mastoid air cells are well-aerated. No significant soft tissue abnormalities are seen.  IMPRESSION: 1. Known scattered small acute infarcts at the corona radiata bilaterally and right body of the corpus callosum are not well assessed on CT. No evidence of hemorrhage transformation. 2. Scattered small vessel ischemic microangiopathy. 3. Small chronic lacunar infarcts at the cerebellar hemispheres bilaterally.   Electronically Signed   By: Roanna Raider M.D.   On: 04/19/2013 06:26   Ct Head Wo Contrast  04/04/2013   CLINICAL DATA:  Right facial droop.  EXAM: CT HEAD WITHOUT CONTRAST  TECHNIQUE: Contiguous axial images were obtained from the base of the skull through the vertex without intravenous contrast.  COMPARISON:  Head CT scan 02/15/2013.  FINDINGS: There is cortical atrophy and chronic microvascular ischemic change. No evidence of acute intracranial abnormality including infarct, hemorrhage, mass lesion, mass effect, midline shift or abnormal extra-axial fluid collection. No hydrocephalus or pneumocephalus. Calvarium intact. Imaged paranasal sinuses are clear. Very small right mastoid effusion is noted, unchanged.  IMPRESSION: No acute abnormality.  Atrophy and chronic microvascular ischemic change. Stable compared to prior exam.  Critical Value/emergent  results were called by telephone at the time of interpretation on 04/04/2013 at 5:28 AM to Dr. Dierdre Highman, who verbally acknowledged these results.   Electronically Signed   By: Drusilla Kanner M.D.   On: 04/04/2013 05:28   Mr Maxine Glenn Head Wo Contrast  04/04/2013   CLINICAL DATA:  62 year old male with hypertension and diabetes last seen normal at 2330 hr yesterday. Right facial droop and garbled speech. Initial encounter.  EXAM: MRI HEAD WITHOUT CONTRAST  MRA HEAD WITHOUT CONTRAST  TECHNIQUE: Multiplanar, multiecho pulse sequences of the brain and surrounding structures were obtained without intravenous contrast. Angiographic images of the head were obtained using MRA technique without contrast.  COMPARISON:  Head CT without contrast 04/04/2013.  FINDINGS: MRI HEAD FINDINGS  There are multiple bilateral foci of abnormal signal on the trace diffusion images. The areas which appear to be most restricted on ADC are in the posterior left MCA territory on series 5, image 22, and the right corpus callosum on image 23. The additional areas either demonstrate isointense ADC, or facilitated diffusion (T2 shine through).  Major intracranial vascular flow voids are preserved.  Extensive chronic small vessel ischemia throughout the brain. Chronic lacunar infarcts in the left thalamus, bilateral corona radiata (including associated chronic blood products e.g. left frontal horn), right paracentral pons, and scattered in both cerebellar hemispheres. Patchy and confluent additional cerebral white matter T2 and FLAIR hyperintensity. No supratentorial cortical encephalomalacia identified. Occasional small areas of chronic blood products in the cerebral hemispheres, and multiple chronic micro hemorrhages in the brainstem and cerebellum.  No midline shift, mass effect, evidence of mass lesion, ventriculomegaly, extra-axial collection or acute intracranial hemorrhage. Cervicomedullary junction and pituitary are within normal limits. Negative  visualized cervical spine. Visualized orbit soft tissues are within normal limits. Minor paranasal sinus mucosal thickening. Trace mastoid fluid and retained secretions in the nasopharynx. Visualized scalp soft tissues are within normal limits. Visualized bone marrow signal is within normal limits.  MRA HEAD FINDINGS  Antegrade flow in the posterior circulation with dominant distal left vertebral artery. Normal left PICA origin.  Dominant right AICA. Patent vertebrobasilar junction. No basilar stenosis. SCA and PCA origins are normal. Normal left posterior communicating artery, the right is diminutive or absent. Mild irregularity in the bilateral PCA P2 segments with preserved distal flow.  Antegrade flow in both ICA siphons. Bilateral cavernous and supra clinoid segment irregularity, worse on the right where there is up to mild to moderate stenosis. Carotid termini remain patent. Ophthalmic and left posterior communicating artery origins are normal.  MCA and ACA origins are patent. Motion artifact at this level degrades detail of the bilateral MCA and ACA vessels. The M1 segment on the right is within normal limits. There is irregularity of the left M1 segment (series 305, image 9) but this could be artifactual. No major MCA or ACA branch occlusion is evident.  IMPRESSION: 1. Multiple acute and subacute small vessel infarcts in the bilateral cerebral hemispheres. No associated mass effect or hemorrhage. Lesions involving the left MCA territory and right body of the corpus callosum appear to be have occurred most recently. 2. Underlying chronic severe small vessel ischemia. 3. Extensive anterior circulation atherosclerosis, most affecting the ICA siphons. Mild to moderate right ICA stenosis. 4. Motion artifact affecting detail of the bilateral MCA and ACA branches, no major branch occlusion identified.   Electronically Signed   By: Augusto Gamble M.D.   On: 04/04/2013 14:53   Mr Brain Wo Contrast  04/04/2013   CLINICAL  DATA:  63 year old male with hypertension and diabetes last seen normal at 2330 hr yesterday. Right facial droop and garbled speech. Initial encounter.  EXAM: MRI HEAD WITHOUT CONTRAST  MRA HEAD WITHOUT CONTRAST  TECHNIQUE: Multiplanar, multiecho pulse sequences of the brain and surrounding structures were obtained without intravenous contrast. Angiographic images of the head were obtained using MRA technique without contrast.  COMPARISON:  Head CT without contrast 04/04/2013.  FINDINGS: MRI HEAD FINDINGS  There are multiple bilateral foci of abnormal signal on the trace diffusion images. The areas which appear to be most restricted on ADC are in the posterior left MCA territory on series 5, image 22, and the right corpus callosum on image 23. The additional areas either demonstrate isointense ADC, or facilitated diffusion (T2 shine through).  Major intracranial vascular flow voids are preserved.  Extensive chronic small vessel ischemia throughout the brain. Chronic lacunar infarcts in the left thalamus, bilateral corona radiata (including associated chronic blood products e.g. left frontal horn), right paracentral pons, and scattered in both cerebellar hemispheres. Patchy and confluent additional cerebral white matter T2 and FLAIR hyperintensity. No supratentorial cortical encephalomalacia identified. Occasional small areas of chronic blood products in the cerebral hemispheres, and multiple chronic micro hemorrhages in the brainstem and cerebellum.  No midline shift, mass effect, evidence of mass lesion, ventriculomegaly, extra-axial collection or acute intracranial hemorrhage. Cervicomedullary junction and pituitary are within normal limits. Negative visualized cervical spine. Visualized orbit soft tissues are within normal limits. Minor paranasal sinus mucosal thickening. Trace mastoid fluid and retained secretions in the nasopharynx. Visualized scalp soft tissues are within normal limits. Visualized bone marrow  signal is within normal limits.  MRA HEAD FINDINGS  Antegrade flow in the posterior circulation with dominant distal left vertebral artery. Normal left PICA origin. Dominant right AICA. Patent vertebrobasilar junction. No basilar stenosis. SCA and PCA origins are normal. Normal left posterior communicating artery, the right is diminutive or absent. Mild irregularity in the bilateral PCA P2 segments with preserved distal flow.  Antegrade flow in both ICA siphons. Bilateral cavernous and supra clinoid segment irregularity,  worse on the right where there is up to mild to moderate stenosis. Carotid termini remain patent. Ophthalmic and left posterior communicating artery origins are normal.  MCA and ACA origins are patent. Motion artifact at this level degrades detail of the bilateral MCA and ACA vessels. The M1 segment on the right is within normal limits. There is irregularity of the left M1 segment (series 305, image 9) but this could be artifactual. No major MCA or ACA branch occlusion is evident.  IMPRESSION: 1. Multiple acute and subacute small vessel infarcts in the bilateral cerebral hemispheres. No associated mass effect or hemorrhage. Lesions involving the left MCA territory and right body of the corpus callosum appear to be have occurred most recently. 2. Underlying chronic severe small vessel ischemia. 3. Extensive anterior circulation atherosclerosis, most affecting the ICA siphons. Mild to moderate right ICA stenosis. 4. Motion artifact affecting detail of the bilateral MCA and ACA branches, no major branch occlusion identified.   Electronically Signed   By: Augusto Gamble M.D.   On: 04/04/2013 14:53   Mr Foot Left W Wo Contrast  04/08/2013   CLINICAL DATA:  Left great toe pain.  Soft tissue ulcer.  EXAM: MRI OF THE LEFT FOREFOOT WITHOUT AND WITH CONTRAST  TECHNIQUE: Multiplanar, multisequence MR imaging was performed both before and after administration of intravenous contrast.  CONTRAST:  15mL MULTIHANCE  GADOBENATE DIMEGLUMINE 529 MG/ML IV SOLN  COMPARISON:  None.  FINDINGS: There is a soft tissue ulcer with mild edema and enhancement along the medial aspect of the great toe. There is no fluid collection. There is no sinus tract.  There is no joint effusion. There is no significant marrow edema within the great toe. 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 There is mild marrow edema and T1 hypointensity in the distal fifth phalanx with enhancement on postcontrast imaging which may be secondary to trauma versus osteomyelitis. There is mild osteoarthritis of the first MTP joint. There is hallux valgus. The visualized flexor and extensor tendons are intact. There is no muscle signal abnormality. There is mild increased signal within the plantar musculature as can be seen in a diabetic. There are no areas of abnormal enhancement.  IMPRESSION: 1. Cellulitis along the medial aspect of the left great toe. No evidence of osteomyelitis of the great toe. 2. Mild marrow edema and T1 hypointensity in the distal fifth phalanx with enhancement on postcontrast imaging which may be secondary to trauma versus osteomyelitis. Correlate with clinical exam.   Electronically Signed   By: Elige Ko   On: 04/08/2013 09:38   Dg Foot Complete Left  04/05/2013   CLINICAL DATA:  Ulceration of the left great toe  EXAM: LEFT FOOT - COMPLETE 3+ VIEW  COMPARISON:  DG TOES 2+V*L* dated 10/07/2012  FINDINGS: There is no acute fracture or dislocation. There is is soft tissue ulcer along medial aspect of the left great toe. There is erosion of the distal tuft of the first distal phalanx concerning for osteomyelitis. There is no soft tissue emphysema. There are mild osteoarthritic changes of the first MTP joint. There is no acute fracture or dislocation.  IMPRESSION: Soft tissue ulceration along the medial aspect of the left great toe with erosion of the distal tuft of the first distal phalanx concerning for osteomyelitis. There is  no soft tissue emphysema.   Electronically Signed   By: Elige Ko   On: 04/05/2013 17:31    Microbiology: Recent Results (from the past 240 hour(s))  MRSA PCR SCREENING     Status: Abnormal   Collection Time    04/18/13 10:49 PM      Result Value Ref Range Status   MRSA by PCR INVALID RESULTS, SPECIMEN SENT FOR CULTURE (*) NEGATIVE Final   Comment: GGAARLAND RN AT 0100 ON [id number]                The GeneXpert MRSA Assay (FDA     approved for NASAL specimens     only), is one component of a     comprehensive MRSA colonization     surveillance program. It is not     intended to diagnose MRSA     infection nor to guide or     monitor treatment for     MRSA infections.  MRSA CULTURE     Status: None   Collection Time    04/18/13 11:00 PM      Result Value Ref Range Status   Specimen Description NOSE   Final   Special Requests NONE   Final   Culture     Final   Value: NO STAPHYLOCOCCUS AUREUS ISOLATED     Note: No MRSA Isolated     Performed at [facility]   Report Status 04/21/2013 FINAL   Final     Labs: Basic Metabolic Panel:  Recent Labs Lab 04/19/13 0324 04/19/13 0559 04/19/13 0956 04/20/13 0338 04/21/13 0427  NA 160* 162* 154* 152* 140  K 3.8 3.9 4.3 3.6* 3.4*  CL 122* 123* 118* 116* 104  CO2 26 27 26 28 26   GLUCOSE 261* 128* 190* 131* 199*  BUN 42* 40* 37* 29* 25*  CREATININE 1.40* 1.33 1.31 1.15 1.01  CALCIUM 9.3 9.5 9.4 8.9 8.3*   Liver Function Tests:  Recent Labs Lab 04/18/13 2000  AST 12  ALT 12  ALKPHOS 170*  BILITOT <0.2*  PROT 7.6  ALBUMIN 4.1   No results found for this basename: LIPASE, AMYLASE,  in the last 168 hours No results found for this basename: AMMONIA,  in the last 168 hours CBC:  Recent Labs Lab 04/18/13 0039 04/18/13 2000 04/19/13 0556 04/20/13 0338 04/21/13 0427  WBC 15.0* 12.4* 16.2* 13.0* 12.6*  HGB 11.4* 12.0* 11.1* 10.8* 10.5*  HCT 34.3* 37.4* 34.1* 33.4* 32.2*  MCV 92.7 96.1 94.5 95.2 95.5  PLT  252 254 229 212 146*   Cardiac Enzymes: No results found for this basename: CKTOTAL, CKMB, CKMBINDEX, TROPONINI,  in the last 168 hours BNP: BNP (last 3 results) No results found for this basename: PROBNP,  in the last 8760 hours CBG:  Recent Labs Lab 04/22/13 0025 04/22/13 0408 04/22/13 0752 04/22/13 1152 04/22/13 1645  GLUCAP 145* 97 143* 250* 243*       Signed:  Nakyah Erdmann  [facility] 04/22/2013, 6:03 PM

## 2013-04-22 NOTE — Progress Notes (Signed)
Occupational Therapy Treatment Patient Details Name: Manuel Higgins MRN: [id number] DOB: 07-20-1950 Today's Date: 04/22/2013    History of present illness 63 yo male admitted with DKA, AMS. Hx of CVA, HTN, DM. Pt is homeless   OT comments  Pt is progressing with OT but needs min A for adls and transfers.  Will benefit from snf.  Will continue to follow in acute  Follow Up Recommendations  SNF (unless [name] can provide 24/7)    Equipment Recommendations  None recommended by OT    Recommendations for Other Services      Precautions / Restrictions Precautions Precautions: Fall Restrictions Weight Bearing Restrictions: No       Mobility Bed Mobility Overal bed mobility: Modified Independent             General bed mobility comments: HOB raised  Transfers     Transfers: Sit to/from Stand Sit to Stand: Min assist         General transfer comment: cues for hand position and assistance to steady.      Balance     Sitting balance-Leahy Scale: Fair       Standing balance-Leahy Scale: Poor                     ADL       Grooming: Oral care;Wash/dry face;Min guard;Standing               Lower Body Dressing: Minimal assistance (socks sitting; assist for R sock--leaned L)   Toilet Transfer: Minimal assistance;BSC;Stand-pivot   Toileting- Clothing Manipulation and Hygiene: Moderate assistance;Sit to/from stand       Functional mobility during ADLs: Minimal assistance;Rolling walker General ADL Comments: cues for safety with RW.  Pt did not lose balance but max cues to complete turn slowly.  Assist for getting soap from soap dispenser.  Pt did not have BM in commode but had fecal material between buttocks.  Wiped with wet cloths and therapist assisted for thoroughness.  Cues not to reach too far out of base of support as he attempted to lean forward and retrieve denture adhesive      Vision                     Perception      Praxis      Cognition   Behavior During Therapy: Flat affect   Area of Impairment: Safety/judgement;Awareness          Safety/Judgement: Decreased awareness of safety;Decreased awareness of deficits Awareness: Intellectual   General Comments: cues for safety throughout tasks.  Needed cues for getting soap from soap dispenser    Extremity/Trunk Assessment               Exercises     Shoulder Instructions       General Comments      Pertinent Vitals/ Pain       No c/o pain  Home Living                                          Prior Functioning/Environment              Frequency Min 2X/week     Progress Toward Goals  OT Goals(current goals can now be found in the care plan section)  Progress towards OT goals: Progressing toward goals     Plan  Discharge plan remains appropriate    Co-evaluation                 End of Session     Activity Tolerance Patient tolerated treatment well   Patient Left in bed;with call bell/phone within reach;with bed alarm set   Nurse Communication          Time: [id number] OT Time Calculation (min): 14 min  Charges: OT General Charges $OT Visit: 1 Procedure OT Treatments $Self Care/Home Management : 8-22 mins  Manuel Higgins 04/22/2013, 3:50 PM  Manuel Higgins, OTR/L [phone] 04/22/2013

## 2013-04-22 NOTE — Progress Notes (Signed)
Inpatient Diabetes Program Recommendations  AACE/ADA: New Consensus Statement on Inpatient Glycemic Control (2013)  Target Ranges:  Prepandial:   less than 140 mg/dL      Peak postprandial:   less than 180 mg/dL (1-2 hours)      Critically ill patients:  140 - 180 mg/dL   Reason for Visit: Hyperglycemia  Results for Manuel Higgins, Manuel Higgins (MRN [id number]) as of 04/22/2013 15:25  Ref. Range 04/21/2013 20:44 04/22/2013 00:25 04/22/2013 04:08 04/22/2013 07:52 04/22/2013 11:52  Glucose-Capillary Latest Range: 70-99 mg/dL 782147 (H) 956145 (H) 97 213143 (H) 250 (H)   Post prandial blood sugar elevated. May benefit from addition of Novolog 3 units tidwc for meal coverage insulin.  Will continue to follow. Thank you. Ailene Ardshonda Demetrick Eichenberger, RD, LDN, CDE Inpatient Diabetes Coordinator [phone]

## 2013-04-22 NOTE — Progress Notes (Signed)
Pt's sisters expressed concerns regarding not being communicated during pt's hospitalizations. SW has been in contact with sisters and RN reinforced that SW will keep them updated on bed placement with SNF. Will continue to monitor pt.

## 2013-04-22 NOTE — Consult Note (Signed)
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  Full thickness. Wound YNW:GNFAOZHYQbed:obscurred by callus Drainage (amount, consistency, odor) None Periwound:Dry, flaking skin Dressing procedure/placement/frequency:I have provided orders for the nursing staff to place a saline-moistened gauze dressing over this area twice daily to soften the callus and remove the dead skin upon remove.It would be ideal if, upon discharge, the patient could be referred to podiatry for callus paring (with a Dremmel, something we do not do here in the inpatient setting) and for topical care of the DFU including guidance on proper footwear. WOC nursing team will not follow, but will remain available to this patient, the nursing and medical team.  Please re-consult if needed. Thanks, Ladona MowLaurie Morene Cecilio, MSN, RN, GNP, [place], CWON-AP [phone])

## 2013-04-22 NOTE — Progress Notes (Addendum)
Clinical Social Work Department CLINICAL SOCIAL WORK PLACEMENT NOTE 04/22/2013  Patient:  Manuel Higgins,Manuel Higgins  Account Number:  [id number] Admit date:  04/18/2013  Clinical Social Worker:  Orpah GreekKELLY FOLEY, LCSWA  Date/time:  04/22/2013 04:00 PM  Clinical Social Work is seeking post-discharge placement for this patient at the following level of care:   SKILLED NURSING   (*CSW will update this form in Epic as items are completed)   04/22/2013  Patient/family provided with [facility] Department of Clinical Social Work's list of facilities offering this level of care within the geographic area requested by the patient (or if unable, by the patient's family).  04/22/2013  Patient/family informed of their freedom to choose among providers that offer the needed level of care, that participate in Medicare, Medicaid or managed care program needed by the patient, have an available bed and are willing to accept the patient.  04/22/2013  Patient/family informed of [facility]' ownership interest in [facility], as well as of the fact that they are under no obligation to receive care at this facility.  PASARR submitted to EDS on 04/22/2013 PASARR number received from EDS on 04/22/2013  FL2 transmitted to all facilities in geographic area requested by pt/family on  04/22/2013 FL2 transmitted to all facilities within larger geographic area on   Patient informed that his/her managed care company has contracts with or will negotiate with  certain facilities, including the following:     Patient/family informed of bed offers received:  04/22/2013 Patient chooses bed at  Physician recommends and patient chooses bed at    Patient to be transferred to  on   Patient to be transferred to facility by   The following physician request were entered in Epic:   Additional Comments:   Lincoln MaxinKelly Mozell Hardacre, LCSW [facility] Clinical Social Worker cell #: [phone]

## 2013-04-23 DIAGNOSIS — G9340 Encephalopathy, unspecified: Secondary | ICD-10-CM

## 2013-04-23 DIAGNOSIS — I674 Hypertensive encephalopathy: Secondary | ICD-10-CM

## 2013-04-23 DIAGNOSIS — E1110 Type 2 diabetes mellitus with ketoacidosis without coma: Secondary | ICD-10-CM

## 2013-04-23 LAB — BASIC METABOLIC PANEL
BUN: 23 mg/dL (ref 6–23)
CHLORIDE: 107 meq/L (ref 96–112)
CO2: 27 mEq/L (ref 19–32)
Calcium: 8.7 mg/dL (ref 8.4–10.5)
Creatinine, Ser: 1.01 mg/dL (ref 0.50–1.35)
GFR calc Af Amer: >90 mL/min (ref >90)
GFR, EST NON AFRICAN AMERICAN: 78 mL/min — AB (ref >90)
GLUCOSE: 74 mg/dL (ref 70–99)
POTASSIUM: 4.2 meq/L (ref 3.7–5.3)
Sodium: 144 mEq/L (ref 137–147)

## 2013-04-23 LAB — GLUCOSE, CAPILLARY
Glucose-Capillary: 98 mg/dL (ref 70–99)
Glucose-Capillary: 118 mg/dL — ABNORMAL HIGH (ref 70–99)
Glucose-Capillary: 270 mg/dL — ABNORMAL HIGH (ref 70–99)

## 2013-04-23 MED ORDER — INSULIN GLARGINE 100 UNIT/ML ~~LOC~~ SOLN: 20.0000 [IU] | Freq: Every day | SUBCUTANEOUS | Status: DC

## 2013-04-23 MED ORDER — DOXYCYCLINE HYCLATE 100 MG PO TABS: 100.0000 mg | ORAL_TABLET | Freq: Two times a day (BID) | ORAL | Status: DC

## 2013-04-23 NOTE — Discharge Summary (Signed)
Physician Discharge Summary  Manuel Higgins WUJ:[id number] DOB: 11/06/1950 DOA: 04/18/2013  PCP: Bernerd LimboMITCHELL, [name]  Admit date: 04/18/2013 Discharge date : 04/23/13  THIS IS An ADDENDUM to Discharge summary as dictated by Dr.Akula yesterday, please see the update medications on this summary instead of DC summary from 4/7. Rest as retailed by Dr.Akula      Medication List    STOP taking these medications       ciprofloxacin 500 MG tablet  Commonly known as:  CIPRO     insulin lispro protamine-lispro (75-25) 100 UNIT/ML Susp injection  Commonly known as:  HUMALOG 75/25 MIX      TAKE these medications       amLODipine 10 MG tablet  Commonly known as:  NORVASC  Take 1 tablet (10 mg total) by mouth daily.     aspirin 325 MG tablet  Take 1 tablet (325 mg total) by mouth daily.     atorvastatin 10 MG tablet  Commonly known as:  LIPITOR  Take 10 mg by mouth daily.     doxycycline 100 MG tablet  Commonly known as:  VIBRA-TABS  Take 1 tablet (100 mg total) by mouth 2 (two) times daily. For 7 days     glipiZIDE 10 MG tablet  Commonly known as:  GLUCOTROL  Take 10 mg by mouth daily before breakfast.     hydrALAZINE 25 MG tablet  Commonly known as:  APRESOLINE  Take 25 mg by mouth 3 (three) times daily.     insulin aspart 100 UNIT/ML injection  Commonly known as:  novoLOG  Inject 0-9 Units into the skin 3 (three) times daily with meals. Sliding scale CBG 70 - 120: 0 units CBG 121 - 150: 1 unit,  CBG 151 - 200: 2 units,  CBG 201 - 250: 3 units,  CBG 251 - 300: 5 units,  CBG 301 - 350: 7 units,  CBG 351 - 400: 9 units   CBG > 400: 9 units and notify your MD     insulin glargine 100 UNIT/ML injection  Commonly known as:  LANTUS  Inject 0.2 mLs (20 Units total) into the skin at bedtime.     lisinopril 10 MG tablet  Commonly known as:  PRINIVIL,ZESTRIL  Take 1 tablet (10 mg total) by mouth daily.     metFORMIN 1000 MG (MOD) 24 hr tablet  Commonly known as:  GLUMETZA  Take 1,000 mg by  mouth 2 (two) times daily with a meal.     naphazoline 0.1 % ophthalmic solution  Commonly known as:  NAPHCON  Place 1 drop into both eyes 4 (four) times daily as needed for irritation.     simvastatin 10 MG tablet  Commonly known as:  ZOCOR  Take 1 tablet (10 mg total) by mouth at bedtime.     terbinafine 250 MG tablet  Commonly known as:  LAMISIL  Take 1 tablet (250 mg total) by mouth daily.       No Known Allergies      Signed:  Zannie Covereetha Kadi Hession  [facility] 04/23/2013, 8:24 AM

## 2013-04-23 NOTE — Progress Notes (Signed)
Report called to Briant Sitesebecca Lewis, RN at [facility]. No questions at this time will notify social worker for transportation.

## 2013-04-23 NOTE — Progress Notes (Signed)
Patient discharged to [facility] via [facility] belongings sent with patient.

## 2013-04-23 NOTE — Progress Notes (Signed)
[facility] to [facility]. Patient & sister, Manuel Higgins aware. [place]. [facility] called for transport pickup (Service Request Id: [id number]).   Clinical Social Work Department CLINICAL SOCIAL WORK PLACEMENT NOTE 04/23/2013  Patient:  Manuel Higgins,Manuel Higgins  Account Number:  [id number] Admit date:  04/18/2013  Clinical Social Worker:  Orpah GreekKELLY FOLEY, LCSWA  Date/time:  04/22/2013 04:00 PM  Clinical Social Work is seeking post-discharge placement for this patient at the following level of care:   SKILLED NURSING   (*CSW will update this form in Epic as items are completed)   04/22/2013  Patient/family provided with [facility] Department of Clinical Social Work's list of facilities offering this level of care within the geographic area requested by the patient (or if unable, by the patient's family).  04/22/2013  Patient/family informed of their freedom to choose among providers that offer the needed level of care, that participate in Medicare, Medicaid or managed care program needed by the patient, have an available bed and are willing to accept the patient.  04/22/2013  Patient/family informed of [facility]' ownership interest in [facility], as well as of the fact that they are under no obligation to receive care at this facility.  PASARR submitted to EDS on 04/22/2013 PASARR number received from EDS on 04/22/2013  FL2 transmitted to all facilities in geographic area requested by pt/family on  04/22/2013 FL2 transmitted to all facilities within larger geographic area on   Patient informed that his/her managed care company has contracts with or will negotiate with  certain facilities, including the following:     Patient/family informed of bed offers received:  04/22/2013 [facility] Physician recommends and patient chooses bed at    [facility] on  04/23/2013 Patient to be transferred to facility by [facility]  The following physician request were entered in Epic:   Additional Comments:   Lincoln MaxinKelly Jan Walters, LCSW [facility] Clinical Social Worker cell #: [phone]

## 2013-04-29 ENCOUNTER — Encounter: Payer: Self-pay | Admitting: Internal Medicine

## 2013-04-29 ENCOUNTER — Ambulatory Visit: Payer: Self-pay | Admitting: *Deleted

## 2013-04-29 DIAGNOSIS — I639 Cerebral infarction, unspecified: Secondary | ICD-10-CM

## 2013-05-08 LAB — MDC_IDC_ENUM_SESS_TYPE_INCLINIC
Date Time Interrogation Session: 20150414134640
MDC IDC SET ZONE DETECTION INTERVAL: 2000 ms
MDC IDC SET ZONE DETECTION INTERVAL: 360 ms
Zone Setting Detection Interval: 3000 ms

## 2013-05-08 NOTE — Progress Notes (Signed)
Loop check in clinic.  Pt with 2 tachy episodes---ST with T wave oversensing; 0 brady episodes; 0 asystole. [facility] and with [facility] in 3 months.

## 2013-07-07 ENCOUNTER — Encounter (INDEPENDENT_AMBULATORY_CARE_PROVIDER_SITE_OTHER): Payer: Self-pay

## 2013-07-07 DIAGNOSIS — Z0289 Encounter for other administrative examinations: Secondary | ICD-10-CM

## 2013-07-23 ENCOUNTER — Encounter (INDEPENDENT_AMBULATORY_CARE_PROVIDER_SITE_OTHER): Payer: Self-pay

## 2013-07-23 DIAGNOSIS — Z0289 Encounter for other administrative examinations: Secondary | ICD-10-CM

## 2013-08-20 ENCOUNTER — Encounter: Payer: Self-pay | Admitting: *Deleted

## 2013-10-23 ENCOUNTER — Encounter: Payer: Self-pay | Admitting: Nurse Practitioner

## 2013-10-23 ENCOUNTER — Ambulatory Visit (INDEPENDENT_AMBULATORY_CARE_PROVIDER_SITE_OTHER): Payer: Medicaid Other | Admitting: Nurse Practitioner

## 2013-10-23 VITALS — BP 155/71 | HR 104 | Temp 97.7°F | Wt 199.8 lb

## 2013-10-23 DIAGNOSIS — I63429 Cerebral infarction due to embolism of unspecified anterior cerebral artery: Secondary | ICD-10-CM

## 2013-10-23 NOTE — Patient Instructions (Signed)
Continue aspirin 325 mg orally every day  for secondary stroke prevention and maintain strict control of hypertension with blood pressure goal below 130/90, diabetes with hemoglobin A1c goal below 6.5% and lipids with LDL cholesterol goal below 70 mg/dL. Followup in the future in 6 months, sooner as needed.  Stroke Prevention Some medical conditions and behaviors are associated with an increased chance of having a stroke. You may prevent a stroke by making healthy choices and managing medical conditions. HOW CAN I REDUCE MY RISK OF HAVING A STROKE?   Stay physically active. Get at least 30 minutes of activity on most or all days.  Do not smoke. It may also be helpful to avoid exposure to secondhand smoke.  Limit alcohol use. Moderate alcohol use is considered to be:  No more than 2 drinks per day for men.  No more than 1 drink per day for nonpregnant women.  Eat healthy foods. This involves:  Eating 5 or more servings of fruits and vegetables a day.  Making dietary changes that address high blood pressure (hypertension), high cholesterol, diabetes, or obesity.  Manage your cholesterol levels.  Making food choices that are high in fiber and low in saturated fat, trans fat, and cholesterol may control cholesterol levels.  Take any prescribed medicines to control cholesterol as directed by your health care provider.  Manage your diabetes.  Controlling your carbohydrate and sugar intake is recommended to manage diabetes.  Take any prescribed medicines to control diabetes as directed by your health care provider.  Control your hypertension.  Making food choices that are low in salt (sodium), saturated fat, trans fat, and cholesterol is recommended to manage hypertension.  Take any prescribed medicines to control hypertension as directed by your health care provider.  Maintain a healthy weight.  Reducing calorie intake and making food choices that are low in sodium, saturated fat,  trans fat, and cholesterol are recommended to manage weight.  Stop drug abuse.  Avoid taking birth control pills.  Talk to your health care provider about the risks of taking birth control pills if you are over 61 years old, smoke, get migraines, or have ever had a blood clot.  Get evaluated for sleep disorders (sleep apnea).  Talk to your health care provider about getting a sleep evaluation if you snore a lot or have excessive sleepiness.  Take medicines only as directed by your health care provider.  For some people, aspirin or blood thinners (anticoagulants) are helpful in reducing the risk of forming abnormal blood clots that can lead to stroke. If you have the irregular heart rhythm of atrial fibrillation, you should be on a blood thinner unless there is a good reason you cannot take them.  Understand all your medicine instructions.  Make sure that other conditions (such as anemia or atherosclerosis) are addressed. SEEK IMMEDIATE MEDICAL CARE IF:   You have sudden weakness or numbness of the face, arm, or leg, especially on one side of the body.  Your face or eyelid droops to one side.  You have sudden confusion.  You have trouble speaking (aphasia) or understanding.  You have sudden trouble seeing in one or both eyes.  You have sudden trouble walking.  You have dizziness.  You have a loss of balance or coordination.  You have a sudden, severe headache with no known cause.  You have new chest pain or an irregular heartbeat. Any of these symptoms may represent a serious problem that is an emergency. Do not wait to  see if the symptoms will go away. Get medical help at once. Call your local emergency services (911 in [place] Do not drive yourself to the hospital. Document Released: 02/10/2004 Document Revised: 05/19/2013 Document Reviewed: 07/05/2012 [facility] Patient Information 2015 [place], [place]. This information is not intended to replace advice given to you by your  health care provider. Make sure you discuss any questions you have with your health care provider.

## 2013-10-23 NOTE — Progress Notes (Signed)
PATIENT: Manuel Higgins DOB: 06/25/1950  REASON FOR VISIT: hospital follow up for stroke HISTORY FROM: patient  HISTORY OF PRESENT ILLNESS: 63 y.o. AA male with a history of hypertension and diabetes mellitus who comes to the office for first hospital follow up post hospital discharge for stroke. He presented with new onset slurred speech and right facial droop on 04/04/13. No history of stroke nor TIA. Patient had no extremity weakness. CT scan of the head showed no acute intracranial abnormality. NIH stroke score was 2. Patient was not administered IV tPA secondary to mild deficits and delay and arrival.  MRI of the brain showed multiple acute and subacute small vessel infarcts in the bilateral cerebral hemispheres. Lesions involving the left MCA territory and right body of the corpus callosum appear to be have occurred most recently. Underlying chronic severe small vessel ischemia.  MRA of the brain showed extensive anterior circulation atherosclerosis, most affecting the ICA siphons. Mild to moderate right ICA stenosis.  2D Echocardiogram with ejection fraction in the range of 60% to 65%. Carotid Doppler showed no stenosis. TEE no PFO, no source or embolus. Hgb a1c 11.4, LDL 101. [facility] hospital discharge [facility] He states that he is on insulin now and his blood sugars are under much better control. He feels like he is back to his baseline since the stroke.  REVIEW OF SYSTEMS: Full 14 system review of systems performed and notable only for: No complaints.   ALLERGIES: No Known Allergies  HOME MEDICATIONS: Outpatient Prescriptions Prior to Visit  Medication Sig Dispense Refill  . amLODipine (NORVASC) 10 MG tablet Take 1 tablet (10 mg total) by mouth daily.  30 tablet  4  . aspirin 325 MG tablet Take 1 tablet (325 mg total) by mouth daily.  30 tablet  3  . atorvastatin (LIPITOR) 10 MG tablet Take 10 mg by mouth daily.      Marland Kitchen. doxycycline  (VIBRA-TABS) 100 MG tablet Take 1 tablet (100 mg total) by mouth 2 (two) times daily. For 7 days  14 tablet  0  . hydrALAZINE (APRESOLINE) 25 MG tablet Take 25 mg by mouth 3 (three) times daily.      . insulin aspart (NOVOLOG) 100 UNIT/ML injection Inject 0-9 Units into the skin 3 (three) times daily with meals. Sliding scale CBG 70 - 120: 0 units CBG 121 - 150: 1 unit,  CBG 151 - 200: 2 units,  CBG 201 - 250: 3 units,  CBG 251 - 300: 5 units,  CBG 301 - 350: 7 units,  CBG 351 - 400: 9 units   CBG > 400: 9 units and notify your MD  10 mL  11  . insulin glargine (LANTUS) 100 UNIT/ML injection Inject 0.2 mLs (20 Units total) into the skin at bedtime.      Marland Kitchen. lisinopril (PRINIVIL,ZESTRIL) 10 MG tablet Take 1 tablet (10 mg total) by mouth daily.  30 tablet  5  . naphazoline (NAPHCON) 0.1 % ophthalmic solution Place 1 drop into both eyes 4 (four) times daily as needed for irritation.  15 mL  0  . metFORMIN (GLUMETZA) 1000 MG (MOD) 24 hr tablet Take 1,000 mg by mouth 2 (two) times daily with a meal.      . simvastatin (ZOCOR) 10 MG tablet Take 1 tablet (10 mg total) by mouth at bedtime.  30 tablet  5  . terbinafine (LAMISIL) 250 MG tablet Take 1 tablet (250 mg total) by  mouth daily.  14 tablet  0  . glipiZIDE (GLUCOTROL) 10 MG tablet Take 10 mg by mouth daily before breakfast.       No facility-administered medications prior to visit.    PHYSICAL EXAM Filed Vitals:   10/23/13 1444  BP: 155/71  Pulse: 104  Temp: 97.7 F (36.5 C)  Weight: 199 lb 12.8 oz (90.629 kg)   Body mass index is 29.49 kg/(m^2).  Generalized: Well developed, in no acute distress  Head: normocephalic and atraumatic. Oropharynx benign  Neck: Supple, no carotid bruits  Cardiac: Regular rate rhythm, no murmur  Musculoskeletal: No deformity   Neurological examination  Mentation: Alert oriented to time, place, history taking. Follows all commands. Speech mildly dysarthric without evidence of aphasia.  Cranial nerve II-XII:  Fundoscopic exam not done. Pupils were equal round reactive to light extraocular movements were full, visual field were full on confrontational test. Facial sensation and strength were normal. hearing was intact to finger rubbing bilaterally. Uvula tongue midline. head turning and shoulder shrug and were normal and symmetric.Tongue protrusion into cheek strength was normal. Motor: The motor testing reveals 5 over 5 strength of all 4 extremities. Good symmetric motor tone is noted throughout.  Sensory: Sensory testing is intact to soft touch on all 4 extremities. No evidence of extinction is noted.  Coordination: Cerebellar testing reveals good finger-nose-finger and heel-to-shin bilaterally.  Gait and station: Gait is wide-based. Short shuffling steps assisted by rolling walker. Tandem gait is not possible. Romberg is negative. Reflexes: Deep tendon reflexes are symmetric and normal bilaterally.  NIHSS: 0 MRs: 1  ASSESSMENT: [name] is a 63 y.o. male presenting with R facial droop and dysarthria on 04/04/13.  Imaging confirmed bilateral anterior circulation infarcts. Infarcts felt to be embolic secondary to unknown etiology. TEE negative for source. Loop recorder placed to assess for atrial fibrillation as cause of stroke. On no antithrombotics prior to admission. He was enrolled in POINT study for secondary stroke prevention, and is now on daily aspirin.  PLAN: I had a long discussion with the patient regarding his recent stroke, discussed results of evaluation in the hospital and answered questions. Continue aspirin 325 mg orally every day for secondary stroke prevention and maintain strict control of hypertension with blood pressure goal below 130/90, diabetes with hemoglobin A1c goal below 6.5% and lipids with LDL cholesterol goal below 70 mg/dL. Followup in the future in 6 months, sooner as needed.  Tawny Asal Calais Svehla, MSN, FNP-BC, A/GNP-C 10/25/2013, 2:15 PM [facility] [address] [place], [place] [address] [phone]  Note: This document was prepared with digital dictation and possible smart phrase technology. Any transcriptional errors that result from this process are unintentional.

## 2013-10-27 NOTE — Progress Notes (Signed)
I agree with the above plan 

## 2013-12-23 ENCOUNTER — Telehealth: Payer: Self-pay | Admitting: Cardiology

## 2013-12-23 NOTE — Telephone Encounter (Signed)
Spoke w/ pt nurse. She was unable to located home monitor. Pt scheduled to see Dr. Ladona Ridgelaylor on 01-20-2013 at 12:15 PM.

## 2013-12-23 NOTE — Telephone Encounter (Signed)
-----   Message from Marily LenteAmber K Seiler, RN sent at 11/30/2013  6:20 PM EST ----- He got a letter from Lupita LeashDonna 08/2013 about missed follow-ups.  Can you call him and see if he will hook his carelink monitor up?  If he's confused, we should make appt to see GT

## 2013-12-25 ENCOUNTER — Encounter (HOSPITAL_COMMUNITY): Payer: Self-pay | Admitting: Internal Medicine

## 2014-01-20 ENCOUNTER — Encounter: Payer: Self-pay | Admitting: Internal Medicine

## 2014-01-20 ENCOUNTER — Ambulatory Visit (INDEPENDENT_AMBULATORY_CARE_PROVIDER_SITE_OTHER): Payer: Medicaid Other | Admitting: Internal Medicine

## 2014-01-20 VITALS — BP 138/60 | HR 90 | Ht 69.0 in | Wt 198.8 lb

## 2014-01-20 DIAGNOSIS — I10 Essential (primary) hypertension: Secondary | ICD-10-CM

## 2014-01-20 DIAGNOSIS — I6309 Cerebral infarction due to thrombosis of other precerebral artery: Secondary | ICD-10-CM

## 2014-01-20 LAB — MDC_IDC_ENUM_SESS_TYPE_INCLINIC

## 2014-01-20 NOTE — Patient Instructions (Signed)
Your physician wants you to follow-up in: 6 months with Dr Taylor You will receive a reminder letter in the mail two months in advance. If you don't receive a letter, please call our office to schedule the follow-up appointment.  

## 2014-01-20 NOTE — Progress Notes (Signed)
HPI Manuel Higgins returns today for followup. He is a pleasant middle age man with HTN and cryptogenic stroke, s/p ILR insertion. Since his device has been placed, he has not had atrial fib. He is living in [facility]. He had no specific complaints today.  No Known Allergies   Current Outpatient Prescriptions  Medication Sig Dispense Refill  . amLODipine (NORVASC) 10 MG tablet Take 1 tablet (10 mg total) by mouth daily. 30 tablet 4  . aspirin 325 MG tablet Take 1 tablet (325 mg total) by mouth daily. 30 tablet 3  . atorvastatin (LIPITOR) 10 MG tablet Take 10 mg by mouth daily.    . hydrALAZINE (APRESOLINE) 25 MG tablet Take 25 mg by mouth 3 (three) times daily.    . insulin aspart (NOVOLOG) 100 UNIT/ML injection Inject 0-9 Units into the skin 3 (three) times daily with meals. Sliding scale CBG 70 - 120: 0 units CBG 121 - 150: 1 unit,  CBG 151 - 200: 2 units,  CBG 201 - 250: 3 units,  CBG 251 - 300: 5 units,  CBG 301 - 350: 7 units,  CBG 351 - 400: 9 units   CBG > 400: 9 units and notify your MD 10 mL 11  . insulin detemir (LEVEMIR) 100 UNIT/ML injection Inject 45 Units into the skin at bedtime.    Marland Kitchen lisinopril (PRINIVIL,ZESTRIL) 10 MG tablet Take 1 tablet (10 mg total) by mouth daily. 30 tablet 5  . naphazoline (NAPHCON) 0.1 % ophthalmic solution Place 1 drop into both eyes 4 (four) times daily as needed for irritation. 15 mL 0  . Vitamin D, Ergocalciferol, (DRISDOL) 50000 UNITS CAPS capsule Take 50,000 Units by mouth 2 (two) times a week. Vitamin d 3     No current facility-administered medications for this visit.     Past Medical History  Diagnosis Date  . Hypertension   . Diabetes mellitus without complication   . Stroke 03/2013    Multiple infarcts  . Hypertensive crisis 02/15/2013  . Toe ulcer     ROS:   All systems reviewed and negative except as noted in the HPI.   Past Surgical History  Procedure Laterality Date  . Back surgery    . Tonsillectomy    . Tee without  cardioversion N/A 04/07/2013    Procedure: TRANSESOPHAGEAL ECHOCARDIOGRAM (TEE);  Surgeon: Lars Masson, MD;  [facility];  Service: Cardiovascular;  Laterality: N/A;  . Loop recorder implant  04/08/2013    MDT LinQ implanted by Dr Ladona Ridgel for cryptogenic stroke  . Loop recorder implant N/A 04/08/2013    Procedure: LOOP RECORDER IMPLANT;  Surgeon: Marinus Maw, MD;  Location: [facility] CATH LAB;  Service: Cardiovascular;  Laterality: N/A;     No family history on file.   History   Social History  . Marital Status: Single    Spouse Name: N/A    Number of Children: 2  . Years of Education: BS   Occupational History  .      disabled   Social History Main Topics  . Smoking status: Former Smoker -- 0.33 packs/day for 10 years    Types: Cigarettes  . Smokeless tobacco: Never Used  . Alcohol Use: No     Comment: Denies Currently but previously marked as yes  . Drug Use: No     Comment: denies hx of IVDrug use  . Sexual Activity: Not on file   Other Topics Concern  . Not on file  Social History Narrative   Lives in [facility] in [place] for past 6 weeks.  Previously homeless.   Unemployed.     BP 138/60 mmHg  Pulse 90  Ht 5\' 9"  (1.753 m)  Wt 198 lb 12.8 oz (90.175 kg)  BMI 29.34 kg/m2  Physical Exam:  Well appearing 64 yo man, with an expressive aphasia,NAD HEENT: Unremarkable Neck:  No JVD, no thyromegally Lymphatics:  No adenopathy Back:  No CVA tenderness Lungs:  Clear with no wheezes, rales, or rhonchi HEART:  Regular rate rhythm, no murmurs, no rubs, no clicks Abd:  soft, positive bowel sounds, no organomegally, no rebound, no guarding Ext:  2 plus pulses, no edema, no cyanosis, no clubbing Skin:  No rashes no nodules   DEVICE  Normal device function.  See PaceArt for details. Normal ILR function.  Assess/Plan:

## 2014-01-20 NOTE — Assessment & Plan Note (Signed)
The etiology of his stroke is unclear. His ILR is working normally and does not demonstrate any atrial fib.

## 2014-01-20 NOTE — Assessment & Plan Note (Signed)
His blood pressure is well controlled today. He will continue his current meds. He is encouraged to maintain a low sodium diet.

## 2014-01-28 ENCOUNTER — Encounter: Payer: Self-pay | Admitting: Internal Medicine

## 2014-02-17 ENCOUNTER — Encounter: Payer: Self-pay | Admitting: Internal Medicine

## 2014-03-04 ENCOUNTER — Ambulatory Visit (INDEPENDENT_AMBULATORY_CARE_PROVIDER_SITE_OTHER): Payer: Medicaid Other | Admitting: *Deleted

## 2014-03-04 DIAGNOSIS — I6309 Cerebral infarction due to thrombosis of other precerebral artery: Secondary | ICD-10-CM

## 2014-03-06 NOTE — Progress Notes (Signed)
Loop recorder 

## 2014-03-17 LAB — MDC_IDC_ENUM_SESS_TYPE_REMOTE
MDC IDC SESS DTM: 20160118193653
MDC IDC SET ZONE DETECTION INTERVAL: 2000 ms
MDC IDC SET ZONE DETECTION INTERVAL: 360 ms
Zone Setting Detection Interval: 3000 ms

## 2014-03-27 ENCOUNTER — Encounter: Payer: Self-pay | Admitting: Internal Medicine

## 2014-04-03 ENCOUNTER — Ambulatory Visit (INDEPENDENT_AMBULATORY_CARE_PROVIDER_SITE_OTHER): Payer: Medicaid Other | Admitting: *Deleted

## 2014-04-03 DIAGNOSIS — I6309 Cerebral infarction due to thrombosis of other precerebral artery: Secondary | ICD-10-CM | POA: Diagnosis not present

## 2014-04-10 NOTE — Progress Notes (Signed)
Loop recorder 

## 2014-04-21 LAB — MDC_IDC_ENUM_SESS_TYPE_REMOTE

## 2014-04-23 ENCOUNTER — Ambulatory Visit (INDEPENDENT_AMBULATORY_CARE_PROVIDER_SITE_OTHER): Payer: Medicaid Other | Admitting: Neurology

## 2014-04-23 ENCOUNTER — Encounter: Payer: Self-pay | Admitting: Neurology

## 2014-04-23 VITALS — BP 148/71 | HR 92 | Resp 18 | Wt 205.2 lb

## 2014-04-23 DIAGNOSIS — I6340 Cerebral infarction due to embolism of unspecified cerebral artery: Secondary | ICD-10-CM | POA: Diagnosis not present

## 2014-04-23 DIAGNOSIS — I10 Essential (primary) hypertension: Secondary | ICD-10-CM | POA: Diagnosis not present

## 2014-04-23 DIAGNOSIS — E1159 Type 2 diabetes mellitus with other circulatory complications: Secondary | ICD-10-CM

## 2014-04-23 DIAGNOSIS — E785 Hyperlipidemia, unspecified: Secondary | ICD-10-CM

## 2014-04-24 DIAGNOSIS — I10 Essential (primary) hypertension: Secondary | ICD-10-CM | POA: Insufficient documentation

## 2014-04-24 DIAGNOSIS — I6340 Cerebral infarction due to embolism of unspecified cerebral artery: Secondary | ICD-10-CM | POA: Insufficient documentation

## 2014-04-24 DIAGNOSIS — E785 Hyperlipidemia, unspecified: Secondary | ICD-10-CM | POA: Insufficient documentation

## 2014-04-24 NOTE — Progress Notes (Signed)
STROKE NEUROLOGY FOLLOW UP NOTE  NAME: Manuel Higgins DOB: January 16, 1951  REASON FOR VISIT: stroke follow up HISTORY FROM: pt and chart and nurse aide from NH  Today we had the pleasure of seeing Manuel Higgins in follow-up at our Neurology Clinic. Pt was accompanied by nurse aide from NH.   History Summary 64 y.o. AA male with a history of hypertension and diabetes mellitus was admitted on 04/04/13 due to new onset slurred speech and right facial droop. No history of stroke nor TIA. Patient had no extremity weakness. CT scan of the head showed no acute intracranial abnormality. NIH stroke score was 2. Patient was not administered IV tPA secondary to mild deficits and delay on arrival. MRI of the brain showed multiple acute and subacute small vessel infarcts in the bilateral cerebral hemispheres. Lesions involving the left MCA territory and right body of the corpus callosum appear to be have occurred most recently. Underlying chronic severe small vessel ischemia. MRA of the brain showed extensive anterior circulation atherosclerosis, most affecting the ICA siphons. Mild to moderate right ICA stenosis. 2D Echocardiogram with ejection fraction in the range of 60% to 65%. Carotid Doppler showed no stenosis. TEE no PFO, no source or embolus. Loop recorder put in. Hgb a1c 11.4, LDL 101.   10/23/13 follow up (LL) - Since hospital discharge he has been residing at [facility] skilled nursing facility. He states that he is on insulin now and his blood sugars are under much better control. He feels like he is back to his baseline since the stroke.  Interval History During the interval time, the patient has been doing the same. He still reside in NH, but able to do most of his ADLs without assistance. He is still on ASA and lipitor. BP today in clinic 148/71. Not sure his glucose control in NH as they do not have record available in hand. Pt stated that he had no change since last visit. Loop recorder for a  year, no AF episode.    REVIEW OF SYSTEMS: Full 14 system review of systems performed and notable only for those listed below and in HPI above, all others are negative:  Constitutional:   Cardiovascular:  Ear/Nose/Throat:   Skin:  Eyes:   Respiratory:   Gastroitestinal:   Genitourinary:  Hematology/Lymphatic:   Endocrine:  Musculoskeletal:   Allergy/Immunology:   Neurological:   Psychiatric:  Sleep:   The following represents the patient's updated allergies and side effects list: No Known Allergies  The neurologically relevant items on the patient's problem list were reviewed on today's visit.  Neurologic Examination  A problem focused neurological exam (12 or more points of the single system neurologic examination, vital signs counts as 1 point, cranial nerves count for 8 points) was performed.  Blood pressure 148/71, pulse 92, resp. rate 18, weight 205 lb 3.2 oz (93.078 kg).  General - Well nourished, well developed, in no apparent distress.  Ophthalmologic - fundi not visualized due to small pupils.  Cardiovascular - Regular rate and rhythm.  Mental Status -  Level of arousal and orientation to time, place, and person were intact. Language including expression, naming, repetition, comprehension was assessed and found intact, moderate dysarthria.  Cranial Nerves II - XII - II - Visual field intact OU. III, IV, VI - Extraocular movements intact. V - Facial sensation intact bilaterally. VII - right facial droop. VIII - Hard of hearing & vestibular intact bilaterally. X - Palate elevates symmetrically, moderate dysarthria. XI - Chin turning &  shoulder shrug intact bilaterally. XII - Tongue protrusion intact.  Motor Strength - The patient's strength was normal in all extremities except right hand dexterity difficult and pronator drift was absent.  Bulk was normal and fasciculations were absent.   Motor Tone - Muscle tone was assessed at the neck and appendages and was  normal.  Reflexes - The patient's reflexes were 1+ in all extremities and he had no pathological reflexes.  Sensory - Light touch, temperature/pinprick were assessed and were symmetrical.    Coordination - The patient had normal movements in the hands with no ataxia or dysmetria.  Tremor was absent.  Gait and Station - difficulty standing up from chairs without help, stooped posturing and decreased arm swing R>L.  Data reviewed: I personally reviewed the images and agree with the radiology interpretations.  TEE - - Left ventricle: Systolic function was normal. The estimated ejection fraction was in the range of 60% to 65%. Wall motion was normal; there were no regional wall motion abnormalities. - Mitral valve: Mild regurgitation. - Left atrium: No evidence of thrombus in the atrial cavity or appendage. No evidence of thrombus in the atrial cavity or appendage. - Right atrium: No evidence of thrombus in the atrial cavity or appendage. - Atrial septum: No defect or patent foramen ovale was identified. Impressions: - No cardiac source of emboli was indentified.  TTE -  - Normal LV size with moderate LV hypertrophy. EF 60-65%. Normal RV size and systolic function. No significant valvular abnormalities.  CUS - Bilateral: 1-39% ICA stenosis. Vertebral artery flow is antegrade.  Loop recorder - no afib for a year now  MRA and MRI brain  1. Multiple acute and subacute small vessel infarcts in the bilateral cerebral hemispheres. No associated mass effect or hemorrhage. Lesions involving the left MCA territory and right body of the corpus callosum appear to be have occurred most recently. 2. Underlying chronic severe small vessel ischemia. 3. Extensive anterior circulation atherosclerosis, most affecting the ICA siphons. Mild to moderate right ICA stenosis. 4. Motion artifact affecting detail of the bilateral MCA and ACA branches, no major branch occlusion  identified.  Component     Latest Ref Rng 04/05/2013 04/21/2013  Cholesterol     0 - 200 mg/dL 161   Triglycerides     <150 mg/dL 096   HDL Cholesterol     >39 mg/dL 49   Total CHOL/HDL Ratio      3.5   VLDL     0 - 40 mg/dL 23   LDL (calc)     0 - 99 mg/dL 045 (H)   Hemoglobin W0J     <5.7 % 11.4 (H)   Mean Plasma Glucose     <117 mg/dL 811 (H)   Vitamin B-14     211 - 911 pg/mL  459    Assessment: As you may recall, he is a 64 y.o. African American male with PMH of HTN, DM was admitted on 04/04/13 for left MCA and right ACA scattered infarcts, embolic pattern. Stroke work up showed large vessel athero on MRA, but negative TTE, TEE, CUS, no PFO and loop recorder negative for afib for a year. A1C 11.4 and LDL 101. His stroke most likely due to large vessel athero due to uncontrolled HTN and DM. Continue ASA and lipitor and aggressively control risk factors.   Plan:  - continue ASA and lipitor for stroke prevention - continue loop recorder monitoring - check BP and glucose regularly in NH and  record and bring over next visit - Follow up with your primary care physician for stroke risk factor modification. Recommend maintain blood pressure goal <130/80, diabetes with hemoglobin A1c goal below 6.5% and lipids with LDL cholesterol goal below 70 mg/dL.  - RTC in 6 months.  No orders of the defined types were placed in this encounter.    No orders of the defined types were placed in this encounter.     Marvel PlanJindong Tysheena Ginzburg, MD PhD [facility] [address], [address] [place], [place] [address] [phone]

## 2014-05-04 ENCOUNTER — Ambulatory Visit (INDEPENDENT_AMBULATORY_CARE_PROVIDER_SITE_OTHER): Payer: Medicaid Other | Admitting: *Deleted

## 2014-05-04 DIAGNOSIS — I6309 Cerebral infarction due to thrombosis of other precerebral artery: Secondary | ICD-10-CM

## 2014-05-05 ENCOUNTER — Telehealth: Payer: Self-pay | Admitting: *Deleted

## 2014-05-05 NOTE — Telephone Encounter (Signed)
[facility] office called to request NPI number for patient.  Pt [facility] by one their providers.  NPI given x 1 visit. Pt needs to contact medicaid office to change provider information on card. Clovis PuMartin, Tamika L, RN

## 2014-05-06 NOTE — Progress Notes (Signed)
Loop recorder 

## 2014-05-20 ENCOUNTER — Encounter: Payer: Self-pay | Admitting: Internal Medicine

## 2014-06-02 ENCOUNTER — Ambulatory Visit (INDEPENDENT_AMBULATORY_CARE_PROVIDER_SITE_OTHER): Payer: Medicaid Other | Admitting: *Deleted

## 2014-06-02 DIAGNOSIS — I6309 Cerebral infarction due to thrombosis of other precerebral artery: Secondary | ICD-10-CM

## 2014-06-02 LAB — CUP PACEART REMOTE DEVICE CHECK: MDC IDC SESS DTM: 20160517155234

## 2014-06-05 NOTE — Progress Notes (Signed)
Loop recorder 

## 2014-06-11 ENCOUNTER — Encounter: Payer: Self-pay | Admitting: Internal Medicine

## 2014-06-18 LAB — CUP PACEART REMOTE DEVICE CHECK: Date Time Interrogation Session: 20160602143210

## 2014-07-02 ENCOUNTER — Ambulatory Visit (INDEPENDENT_AMBULATORY_CARE_PROVIDER_SITE_OTHER): Payer: Medicaid Other | Admitting: *Deleted

## 2014-07-02 DIAGNOSIS — I6309 Cerebral infarction due to thrombosis of other precerebral artery: Secondary | ICD-10-CM | POA: Diagnosis not present

## 2014-07-06 ENCOUNTER — Encounter: Payer: Self-pay | Admitting: Internal Medicine

## 2014-07-06 NOTE — Progress Notes (Signed)
Loop recorder 

## 2014-07-13 LAB — CUP PACEART REMOTE DEVICE CHECK: Date Time Interrogation Session: 20160627170029

## 2014-07-14 ENCOUNTER — Encounter: Payer: Self-pay | Admitting: Cardiology

## 2014-07-23 ENCOUNTER — Encounter: Payer: Self-pay | Admitting: Cardiology

## 2014-07-28 ENCOUNTER — Encounter: Payer: Self-pay | Admitting: Internal Medicine

## 2014-07-29 ENCOUNTER — Encounter: Payer: Self-pay | Admitting: Cardiology

## 2014-08-05 ENCOUNTER — Encounter: Payer: Self-pay | Admitting: Cardiology

## 2014-08-07 ENCOUNTER — Encounter: Payer: Medicaid Other | Admitting: Internal Medicine

## 2014-08-20 ENCOUNTER — Ambulatory Visit (INDEPENDENT_AMBULATORY_CARE_PROVIDER_SITE_OTHER): Payer: Medicaid Other | Admitting: Internal Medicine

## 2014-08-20 ENCOUNTER — Encounter: Payer: Self-pay | Admitting: Internal Medicine

## 2014-08-20 VITALS — BP 150/70 | HR 75 | Ht 69.0 in | Wt 200.2 lb

## 2014-08-20 DIAGNOSIS — I674 Hypertensive encephalopathy: Secondary | ICD-10-CM | POA: Diagnosis not present

## 2014-08-20 DIAGNOSIS — I6340 Cerebral infarction due to embolism of unspecified cerebral artery: Secondary | ICD-10-CM | POA: Diagnosis not present

## 2014-08-20 LAB — CUP PACEART INCLINIC DEVICE CHECK
MDC IDC SESS DTM: 20160804093454
MDC IDC SET ZONE DETECTION INTERVAL: 2000 ms
MDC IDC SET ZONE DETECTION INTERVAL: 3000 ms
Zone Setting Detection Interval: 360 ms

## 2014-08-20 NOTE — Progress Notes (Signed)
HPI [name] returns today for followup. He is a middle aged man with a cryptogenic stroke, s/p ILR insertion. He denies palpitations. No syncope. Denies sob or chest pressure. Appetite is good.  No Known Allergies   Current Outpatient Prescriptions  Medication Sig Dispense Refill  . amLODipine (NORVASC) 10 MG tablet Take 1 tablet (10 mg total) by mouth daily. 30 tablet 4  . aspirin 325 MG tablet Take 1 tablet (325 mg total) by mouth daily. 30 tablet 3  . atorvastatin (LIPITOR) 10 MG tablet Take 10 mg by mouth daily.    . hydrALAZINE (APRESOLINE) 50 MG tablet Take 50 mg by mouth 3 (three) times daily.    . insulin aspart (NOVOLOG) 100 UNIT/ML injection Inject 0-9 Units into the skin 3 (three) times daily with meals. Sliding scale CBG 70 - 120: 0 units CBG 121 - 150: 1 unit,  CBG 151 - 200: 2 units,  CBG 201 - 250: 3 units,  CBG 251 - 300: 5 units,  CBG 301 - 350: 7 units,  CBG 351 - 400: 9 units   CBG > 400: 9 units and notify your MD 10 mL 11  . insulin detemir (LEVEMIR) 100 UNIT/ML injection Inject 10 Units into the skin every morning. And inject 45 units into the skin at bedtime    . lisinopril (PRINIVIL,ZESTRIL) 10 MG tablet Take 1 tablet (10 mg total) by mouth daily. 30 tablet 5  . naphazoline (NAPHCON) 0.1 % ophthalmic solution Place 1 drop into both eyes 4 (four) times daily as needed for irritation. 15 mL 0  . Vitamin D, Ergocalciferol, (DRISDOL) 50000 UNITS CAPS capsule Take 50,000 Units by mouth 2 (two) times a week. Vitamin d 3     No current facility-administered medications for this visit.     Past Medical History  Diagnosis Date  . Hypertension   . Diabetes mellitus without complication   . Stroke 03/2013    Multiple infarcts  . Hypertensive crisis 02/15/2013  . Toe ulcer   . Anemia   . Dysphagia   . Hyperlipidemia   . Dementia     unspecified w/o behavioral disturbance    ROS:   All systems reviewed and negative except as noted in the HPI.   Past  Surgical History  Procedure Laterality Date  . Back surgery    . Tonsillectomy    . Tee without cardioversion N/A 04/07/2013    Procedure: TRANSESOPHAGEAL ECHOCARDIOGRAM (TEE);  Surgeon: Lars Masson, MD;  Location: [facility] ENDOSCOPY;  Service: Cardiovascular;  Laterality: N/A;  . Loop recorder implant  04/08/2013    MDT LinQ implanted by Dr Ladona Ridgel for cryptogenic stroke  . Loop recorder implant N/A 04/08/2013    Procedure: LOOP RECORDER IMPLANT;  Surgeon: Marinus Maw, MD;  Location: [facility] CATH LAB;  Service: Cardiovascular;  Laterality: N/A;     Family History  Problem Relation Age of Onset  . Family history unknown: Yes     History   Social History  . Marital Status: Single    Spouse Name: N/A  . Number of Children: 2  . Years of Education: BS   Occupational History  .      disabled   Social History Main Topics  . Smoking status: Former Smoker -- 0.33 packs/day for 10 years    Types: Cigarettes    Quit date: 01/17/2004  . Smokeless tobacco: Never Used  . Alcohol Use: No     Comment: Denies Currently but  previously marked as yes  . Drug Use: No     Comment: denies hx of IVDrug use  . Sexual Activity: Not on file   Other Topics Concern  . Not on file   Social History Narrative   Lives in [facility] in [place] for past 6 weeks.  Previously homeless.   Unemployed.  Caffeine 1 cup coffee, sweet tea daily.     BP 150/70 mmHg  Pulse 75  Ht 5\' 9"  (1.753 m)  Wt 200 lb 3.2 oz (90.81 kg)  BMI 29.55 kg/m2  Physical Exam:  stable appearing 64 yo man, NAD HEENT: Unremarkable Neck:  No JVD, no thyromegally Back:  No CVA tenderness Lungs:  Clear with no wheezes HEART:  Regular rate rhythm, no murmurs, no rubs, no clicks Abd:  soft, positive bowel sounds, no organomegally, no rebound, no guarding Ext:  2 plus pulses, no edema, no cyanosis, no clubbing Skin:  No rashes no nodules Neuro:  CN II through XII intact, motor grossly intact    DEVICE    Normal device function.  See PaceArt for details. No atrial fib or pauses.  Assess/Plan:

## 2014-08-20 NOTE — Assessment & Plan Note (Signed)
His blood pressure is slightly elevated today. Will follow. He is encouraged to reduce his salt intake.

## 2014-08-20 NOTE — Assessment & Plan Note (Signed)
He has had no recurrent new stroke symptoms. Interogation of his loop recorder demonstrates no atrial fib. Will follow.

## 2014-08-20 NOTE — Patient Instructions (Signed)
Medication Instructions:  Your physician recommends that you continue on your current medications as directed. Please refer to the Current Medication list given to you today.   Labwork: NONE ordered  Testing/Procedures: NONE ordered  Follow-Up: Your physician wants you to follow-up in: 1 year with Dr. Ladona Ridgel.  You will receive a reminder letter in the mail two months in advance. If you don't receive a letter, please call our office to schedule the follow-up appointment.   Any Other Special Instructions Will Be Listed Below (If Applicable).  Dr. Ladona Ridgel has recommended that you go on low Sodium diet  Low-Sodium Eating Plan Sodium raises blood pressure and causes water to be held in the body. Getting less sodium from food will help lower your blood pressure, reduce any swelling, and protect your heart, liver, and kidneys. We get sodium by adding salt (sodium chloride) to food. Most of our sodium comes from canned, boxed, and frozen foods. Restaurant foods, fast foods, and pizza are also very high in sodium. Even if you take medicine to lower your blood pressure or to reduce fluid in your body, getting less sodium from your food is important. WHAT IS MY PLAN? Most people should limit their sodium intake to 2,300 mg a day. Your health care provider recommends that you limit your sodium intake to 2 grams a day.  WHAT DO I NEED TO KNOW ABOUT THIS EATING PLAN? For the low-sodium eating plan, you will follow these general guidelines:  Choose foods with a % Daily Value for sodium of less than 5% (as listed on the food label).   Use salt-free seasonings or herbs instead of table salt or sea salt.   Check with your health care provider or pharmacist before using salt substitutes.   Eat fresh foods.  Eat more vegetables and fruits.  Limit canned vegetables. If you do use them, rinse them well to decrease the sodium.   Limit cheese to 1 oz (28 g) per day.   Eat lower-sodium products,  often labeled as "lower sodium" or "no salt added."  Avoid foods that contain monosodium glutamate (MSG). MSG is sometimes added to [place] food and some canned foods.  Check food labels (Nutrition Facts labels) on foods to learn how much sodium is in one serving.  Eat more home-cooked food and less restaurant, buffet, and fast food.  When eating at a restaurant, ask that your food be prepared with less salt or none, if possible.  HOW DO I READ FOOD LABELS FOR SODIUM INFORMATION? The Nutrition Facts label lists the amount of sodium in one serving of the food. If you eat more than one serving, you must multiply the listed amount of sodium by the number of servings. Food labels may also identify foods as:  Sodium free--Less than 5 mg in a serving.  Very low sodium--35 mg or less in a serving.  Low sodium--140 mg or less in a serving.  Light in sodium--50% less sodium in a serving. For example, if a food that usually has 300 mg of sodium is changed to become light in sodium, it will have 150 mg of sodium.  Reduced sodium--25% less sodium in a serving. For example, if a food that usually has 400 mg of sodium is changed to reduced sodium, it will have 300 mg of sodium. WHAT FOODS CAN I EAT? Grains Low-sodium cereals, including oats, puffed wheat and rice, and shredded wheat cereals. Low-sodium crackers. Unsalted rice and pasta. Lower-sodium bread.  Vegetables Frozen or fresh vegetables.  Low-sodium or reduced-sodium canned vegetables. Low-sodium or reduced-sodium tomato sauce and paste. Low-sodium or reduced-sodium tomato and vegetable juices.  Fruits Fresh, frozen, and canned fruit. Fruit juice.  Meat and Other Protein Products Low-sodium canned tuna and salmon. Fresh or frozen meat, poultry, seafood, and fish. Lamb. Unsalted nuts. Dried beans, peas, and lentils without added salt. Unsalted canned beans. Homemade soups without salt. Eggs.  Dairy Milk. Soy milk. Ricotta cheese.  Low-sodium or reduced-sodium cheeses. Yogurt.  Condiments Fresh and dried herbs and spices. Salt-free seasonings. Onion and garlic powders. Low-sodium varieties of mustard and ketchup. Lemon juice.  Fats and Oils Reduced-sodium salad dressings. Unsalted butter.  Other Unsalted popcorn and pretzels.  The items listed above may not be a complete list of recommended foods or beverages. Contact your dietitian for more options. WHAT FOODS ARE NOT RECOMMENDED? Grains Instant hot cereals. Bread stuffing, pancake, and biscuit mixes. Croutons. Seasoned rice or pasta mixes. Noodle soup cups. Boxed or frozen macaroni and cheese. Self-rising flour. Regular salted crackers. Vegetables Regular canned vegetables. Regular canned tomato sauce and paste. Regular tomato and vegetable juices. Frozen vegetables in sauces. Salted french fries. Olives. Angie Fava. Relishes. Sauerkraut. Salsa. Meat and Other Protein Products Salted, canned, smoked, spiced, or pickled meats, seafood, or fish. Bacon, ham, sausage, hot dogs, corned beef, chipped beef, and packaged luncheon meats. Salt pork. Jerky. Pickled herring. Anchovies, regular canned tuna, and sardines. Salted nuts. Dairy Processed cheese and cheese spreads. Cheese curds. Blue cheese and cottage cheese. Buttermilk.  Condiments Onion and garlic salt, seasoned salt, table salt, and sea salt. Canned and packaged gravies. Worcestershire sauce. Tartar sauce. Barbecue sauce. Teriyaki sauce. Soy sauce, including reduced sodium. Steak sauce. Fish sauce. Oyster sauce. Cocktail sauce. Horseradish. Regular ketchup and mustard. Meat flavorings and tenderizers. Bouillon cubes. Hot sauce. Tabasco sauce. Marinades. Taco seasonings. Relishes. Fats and Oils Regular salad dressings. Salted butter. Margarine. Ghee. Bacon fat.  Other Potato and tortilla chips. Corn chips and puffs. Salted popcorn and pretzels. Canned or dried soups. Pizza. Frozen entrees and pot pies.  The  items listed above may not be a complete list of foods and beverages to avoid. Contact your dietitian for more information. Document Released: 06/24/2001 Document Revised: 01/07/2013 Document Reviewed: 11/06/2012 [facility] Patient Information 2015 [place], [place]. This information is not intended to replace advice given to you by your health care provider. Make sure you discuss any questions you have with your health care provider.

## 2014-08-21 ENCOUNTER — Encounter: Payer: Self-pay | Admitting: Cardiology

## 2014-08-26 ENCOUNTER — Encounter: Payer: Self-pay | Admitting: Internal Medicine

## 2014-08-26 ENCOUNTER — Encounter: Payer: Self-pay | Admitting: Cardiology

## 2014-09-02 ENCOUNTER — Encounter: Payer: Self-pay | Admitting: Cardiology

## 2014-09-10 ENCOUNTER — Encounter: Payer: Self-pay | Admitting: Cardiology

## 2015-03-05 ENCOUNTER — Encounter: Payer: Self-pay | Admitting: Cardiology

## 2015-04-02 ENCOUNTER — Telehealth: Payer: Self-pay | Admitting: Cardiology

## 2015-04-02 NOTE — Telephone Encounter (Signed)
LMOVM for pt sister to call in regards to pt home monitor.

## 2015-04-06 ENCOUNTER — Telehealth: Payer: Self-pay | Admitting: Cardiology

## 2015-04-06 NOTE — Telephone Encounter (Signed)
Spoke w/ pt sister and informed her that pt needs his home monitor so that we can follow his loop recorder. She doesn't recall pt having this device implanted and isn't sure if he ever received a monitor. She is going to call tech services and have a new monitor ordered.

## 2015-05-07 ENCOUNTER — Encounter (HOSPITAL_COMMUNITY): Payer: Self-pay | Admitting: *Deleted

## 2015-05-07 ENCOUNTER — Telehealth: Payer: Self-pay | Admitting: Cardiology

## 2015-05-07 ENCOUNTER — Emergency Department (HOSPITAL_COMMUNITY): Payer: Medicaid Other

## 2015-05-07 ENCOUNTER — Observation Stay (HOSPITAL_COMMUNITY): Payer: Medicaid Other

## 2015-05-07 ENCOUNTER — Inpatient Hospital Stay (HOSPITAL_COMMUNITY)
Admission: EM | Admit: 2015-05-07 | Discharge: 2015-05-10 | DRG: 057 | Disposition: A | Discharge status: Nursing Home (Includes ICF) | Payer: Medicaid Other | Attending: Internal Medicine | Admitting: Internal Medicine

## 2015-05-07 DIAGNOSIS — Z79899 Other long term (current) drug therapy: Secondary | ICD-10-CM

## 2015-05-07 DIAGNOSIS — E785 Hyperlipidemia, unspecified: Secondary | ICD-10-CM | POA: Diagnosis present

## 2015-05-07 DIAGNOSIS — E11649 Type 2 diabetes mellitus with hypoglycemia without coma: Secondary | ICD-10-CM

## 2015-05-07 DIAGNOSIS — R29898 Other symptoms and signs involving the musculoskeletal system: Secondary | ICD-10-CM | POA: Diagnosis not present

## 2015-05-07 DIAGNOSIS — Z794 Long term (current) use of insulin: Secondary | ICD-10-CM

## 2015-05-07 DIAGNOSIS — R4182 Altered mental status, unspecified: Secondary | ICD-10-CM | POA: Insufficient documentation

## 2015-05-07 DIAGNOSIS — I10 Essential (primary) hypertension: Secondary | ICD-10-CM | POA: Diagnosis present

## 2015-05-07 DIAGNOSIS — Z8673 Personal history of transient ischemic attack (TIA), and cerebral infarction without residual deficits: Secondary | ICD-10-CM | POA: Diagnosis not present

## 2015-05-07 DIAGNOSIS — E1159 Type 2 diabetes mellitus with other circulatory complications: Secondary | ICD-10-CM

## 2015-05-07 DIAGNOSIS — I639 Cerebral infarction, unspecified: Secondary | ICD-10-CM

## 2015-05-07 DIAGNOSIS — R471 Dysarthria and anarthria: Secondary | ICD-10-CM | POA: Diagnosis present

## 2015-05-07 DIAGNOSIS — L98499 Non-pressure chronic ulcer of skin of other sites with unspecified severity: Secondary | ICD-10-CM

## 2015-05-07 DIAGNOSIS — I731 Thromboangiitis obliterans [Buerger's disease]: Secondary | ICD-10-CM | POA: Diagnosis present

## 2015-05-07 DIAGNOSIS — G9340 Encephalopathy, unspecified: Secondary | ICD-10-CM | POA: Diagnosis present

## 2015-05-07 DIAGNOSIS — Z7982 Long term (current) use of aspirin: Secondary | ICD-10-CM

## 2015-05-07 DIAGNOSIS — R258 Other abnormal involuntary movements: Secondary | ICD-10-CM | POA: Diagnosis not present

## 2015-05-07 DIAGNOSIS — R400 Somnolence: Secondary | ICD-10-CM | POA: Diagnosis not present

## 2015-05-07 DIAGNOSIS — Z22322 Carrier or suspected carrier of Methicillin resistant Staphylococcus aureus: Secondary | ICD-10-CM

## 2015-05-07 DIAGNOSIS — I6930 Unspecified sequelae of cerebral infarction: Secondary | ICD-10-CM

## 2015-05-07 DIAGNOSIS — M62838 Other muscle spasm: Secondary | ICD-10-CM | POA: Diagnosis present

## 2015-05-07 DIAGNOSIS — G2111 Neuroleptic induced parkinsonism: Secondary | ICD-10-CM | POA: Diagnosis not present

## 2015-05-07 DIAGNOSIS — T43595A Adverse effect of other antipsychotics and neuroleptics, initial encounter: Secondary | ICD-10-CM | POA: Diagnosis present

## 2015-05-07 DIAGNOSIS — F0390 Unspecified dementia without behavioral disturbance: Secondary | ICD-10-CM | POA: Diagnosis present

## 2015-05-07 DIAGNOSIS — G2119 Other drug induced secondary parkinsonism: Secondary | ICD-10-CM | POA: Diagnosis present

## 2015-05-07 DIAGNOSIS — E119 Type 2 diabetes mellitus without complications: Secondary | ICD-10-CM

## 2015-05-07 LAB — CBC WITH DIFFERENTIAL/PLATELET
BASOS ABS: 0 10*3/uL (ref 0.0–0.1)
BASOS PCT: 0 %
Eosinophils Absolute: 0 10*3/uL (ref 0.0–0.7)
Eosinophils Relative: 0 %
HEMATOCRIT: 32.4 % — AB (ref 39.0–52.0)
HEMOGLOBIN: 10.4 g/dL — AB (ref 13.0–17.0)
LYMPHS PCT: 8 %
Lymphs Abs: 0.8 10*3/uL (ref 0.7–4.0)
MCH: 31 pg (ref 26.0–34.0)
MCHC: 32.1 g/dL (ref 30.0–36.0)
MCV: 96.4 fL (ref 78.0–100.0)
Monocytes Absolute: 0.7 10*3/uL (ref 0.1–1.0)
Monocytes Relative: 7 %
NEUTROS ABS: 8.4 10*3/uL — AB (ref 1.7–7.7)
NEUTROS PCT: 85 %
Platelets: 241 10*3/uL (ref 150–400)
RBC: 3.36 MIL/uL — ABNORMAL LOW (ref 4.22–5.81)
RDW: 13 % (ref 11.5–15.5)
WBC: 9.9 10*3/uL (ref 4.0–10.5)

## 2015-05-07 LAB — COMPREHENSIVE METABOLIC PANEL
ALBUMIN: 3.2 g/dL — AB (ref 3.5–5.0)
ALK PHOS: 77 U/L (ref 38–126)
ALT: 14 U/L — AB (ref 17–63)
AST: 23 U/L (ref 15–41)
Anion gap: 10 (ref 5–15)
BILIRUBIN TOTAL: 0.3 mg/dL (ref 0.3–1.2)
BUN: 20 mg/dL (ref 6–20)
CALCIUM: 9.1 mg/dL (ref 8.9–10.3)
CO2: 23 mmol/L (ref 22–32)
CREATININE: 1.06 mg/dL (ref 0.61–1.24)
Chloride: 106 mmol/L (ref 101–111)
GFR calc Af Amer: >60 mL/min (ref >60)
GFR calc non Af Amer: >60 mL/min (ref >60)
GLUCOSE: 130 mg/dL — AB (ref 65–99)
Potassium: 4.5 mmol/L (ref 3.5–5.1)
Sodium: 139 mmol/L (ref 135–145)
TOTAL PROTEIN: 6.7 g/dL (ref 6.5–8.1)

## 2015-05-07 LAB — URINALYSIS, ROUTINE W REFLEX MICROSCOPIC
Bilirubin Urine: NEGATIVE
GLUCOSE, UA: NEGATIVE mg/dL
HGB URINE DIPSTICK: NEGATIVE
Ketones, ur: NEGATIVE mg/dL
Leukocytes, UA: NEGATIVE
Nitrite: NEGATIVE
PROTEIN: NEGATIVE mg/dL
Specific Gravity, Urine: 1.016 (ref 1.005–1.030)
pH: 6 (ref 5.0–8.0)

## 2015-05-07 LAB — I-STAT CHEM 8, ED
BUN: 22 mg/dL — ABNORMAL HIGH (ref 6–20)
CALCIUM ION: 1.2 mmol/L (ref 1.13–1.30)
CHLORIDE: 103 mmol/L (ref 101–111)
Creatinine, Ser: 1 mg/dL (ref 0.61–1.24)
GLUCOSE: 127 mg/dL — AB (ref 65–99)
HCT: 35 % — ABNORMAL LOW (ref 39.0–52.0)
Hemoglobin: 11.9 g/dL — ABNORMAL LOW (ref 13.0–17.0)
Potassium: 4.5 mmol/L (ref 3.5–5.1)
Sodium: 141 mmol/L (ref 135–145)
TCO2: 24 mmol/L (ref 0–100)

## 2015-05-07 LAB — C-REACTIVE PROTEIN: CRP: 0.7 mg/dL (ref <1.0)

## 2015-05-07 LAB — CBG MONITORING, ED
GLUCOSE-CAPILLARY: 100 mg/dL — AB (ref 65–99)
Glucose-Capillary: 120 mg/dL — ABNORMAL HIGH (ref 65–99)
Glucose-Capillary: 95 mg/dL (ref 65–99)
Glucose-Capillary: 156 mg/dL — ABNORMAL HIGH (ref 65–99)

## 2015-05-07 LAB — PROTIME-INR
INR: 1.12 (ref 0.00–1.49)
PROTHROMBIN TIME: 14.6 s (ref 11.6–15.2)

## 2015-05-07 LAB — GLUCOSE, CAPILLARY: Glucose-Capillary: 71 mg/dL (ref 65–99)

## 2015-05-07 LAB — SEDIMENTATION RATE: Sed Rate: 45 mm/hr — ABNORMAL HIGH (ref 0–16)

## 2015-05-07 LAB — VALPROIC ACID LEVEL: Valproic Acid Lvl: <10 ug/mL — ABNORMAL LOW (ref 50.0–100.0)

## 2015-05-07 MED ORDER — INSULIN ASPART 100 UNIT/ML ~~LOC~~ SOLN
0.0000 [IU] | SUBCUTANEOUS | Status: DC
  Administered 2015-05-07 – 2015-05-09 (×3): 3 [IU] via SUBCUTANEOUS
  Administered 2015-05-09 (×2): 2 [IU] via SUBCUTANEOUS
  Administered 2015-05-10: 3 [IU] via SUBCUTANEOUS
  Administered 2015-05-10: 2 [IU] via SUBCUTANEOUS

## 2015-05-07 MED ORDER — AMLODIPINE BESYLATE 5 MG PO TABS: 10.0000 mg | ORAL_TABLET | Freq: Once | ORAL | Status: DC

## 2015-05-07 MED ORDER — SODIUM CHLORIDE 0.9 % IV BOLUS (SEPSIS)
1000.0000 mL | Freq: Once | INTRAVENOUS | Status: AC
  Administered 2015-05-07: 1000 mL via INTRAVENOUS

## 2015-05-07 MED ORDER — SODIUM CHLORIDE 0.9% FLUSH
3.0000 mL | Freq: Two times a day (BID) | INTRAVENOUS | Status: DC
  Administered 2015-05-07 – 2015-05-08 (×2): 3 mL via INTRAVENOUS

## 2015-05-07 MED ORDER — HYDRALAZINE HCL 20 MG/ML IJ SOLN
15.0000 mg | Freq: Once | INTRAMUSCULAR | Status: AC
  Administered 2015-05-07: 15 mg via INTRAVENOUS
  Filled 2015-05-07: qty 1

## 2015-05-07 MED ORDER — HYDRALAZINE HCL 25 MG PO TABS: 50.0000 mg | ORAL_TABLET | Freq: Once | ORAL | Status: DC

## 2015-05-07 MED ORDER — ENOXAPARIN SODIUM 40 MG/0.4ML ~~LOC~~ SOLN
40.0000 mg | SUBCUTANEOUS | Status: DC
  Administered 2015-05-07 – 2015-05-09 (×3): 40 mg via SUBCUTANEOUS
  Filled 2015-05-07 (×3): qty 0.4

## 2015-05-07 MED ORDER — HYDRALAZINE HCL 20 MG/ML IJ SOLN: 5.0000 mg | INTRAMUSCULAR | Status: DC | PRN

## 2015-05-07 MED ORDER — ASPIRIN 325 MG PO TABS: 325.0000 mg | ORAL_TABLET | Freq: Once | ORAL | Status: AC

## 2015-05-07 MED ORDER — VALPROATE SODIUM 500 MG/5ML IV SOLN
250.0000 mg | Freq: Two times a day (BID) | INTRAVENOUS | Status: DC
  Administered 2015-05-07 – 2015-05-08 (×2): 250 mg via INTRAVENOUS
  Filled 2015-05-07 (×3): qty 2.5

## 2015-05-07 MED ORDER — ASPIRIN 300 MG RE SUPP
300.0000 mg | Freq: Once | RECTAL | Status: AC
  Administered 2015-05-07: 300 mg via RECTAL
  Filled 2015-05-07: qty 1

## 2015-05-07 MED ORDER — HYDRALAZINE HCL 20 MG/ML IJ SOLN: 10.0000 mg | Freq: Three times a day (TID) | INTRAMUSCULAR | Status: DC | PRN

## 2015-05-07 NOTE — ED Notes (Signed)
Patient transported to MRI 

## 2015-05-07 NOTE — Telephone Encounter (Signed)
Pt brother called and stated that he received home monitor for pt loop recorder. Informed pt brother that when he is ready to set up the monitor to give me a call and I will help him set it up. Pt brother verbalized understanding.

## 2015-05-07 NOTE — H&P (Signed)
Date: 05/07/2015               Patient Name:  Manuel Higgins MRN: [id number]  DOB: 05-08-1950 Age / Sex: 65 y.o., male   PCP: Manuel Higgins         Medical Service: Internal Medicine Teaching Service         Attending Physician: Dr. Oval Linsey, MD    First Contact: Manuel Higgins Pager: [phone]  Second Contact: Manuel Higgins Pager: [phone]       After Hours (After 5p/  First Contact Pager: [phone]  weekends / holidays): Second Contact Pager: [phone]   Chief Complaint: Somnolence and confusion  History of Present Illness:  Manuel Higgins is a 65 year old man with ischemic stroke back in March 2015 on aspirin 389m daily, insulin-dependent type 2 diabetes, hypertension, and hyperlipidemia presenting with acute-onset somnolence and confusion.  Per the family, until today, [name] independent of all activities of daily living, living in a nursing home because he is unable to manage his medication regimen alone, where he was perfectly normal and would carry on conversations with his family. His brother went to visit him yesterday; he was sleeping and difficult to arouse, but when he woke up, he was his normal self. During that visit, he noticed black ulcers on his fingertips that had reportedly been there for the last week. This morning, a staff member found him unresponsive; he had a capillary glucose of 40. He was given juice; his sugars subsequently improved, but he did not awaken any more. In the room, we found a medication list from his SNF; he had been getting all of his medications except for divalproex for the last 2 days. It's unclear why he's on this medication as we saw no history of seizures in his chart. When we saw him, he was able to say yes and no and was able to say he was not in any pain. However he did not know his name nor where he was located. We were unable to obtain any further history nor review of systems due to the patient's somnolence and  confusion.  Medications: Amlodipine 156mdaily Aspirin 32563maily Atorvastatin 49m7mily Divalproex 250mg78mce daily Hydralazine 50mg 33me times daily Lisinopril 49mg d7m Lorazepam 0.5mg twi12mdaily as needed for anxiety Risperidone 0.25mg twi36maily Detemir 45U nightly and 10U in the morning Aspart sliding scale  Allergies: No known allergies  Past medical history: Hypertension Insulin-dependent type 2 diabetes, last a1c 11.5 in 2015 Ischemic stroke 2015 Hyperlipidemia  Family history: Unable to be obtained due to patient's inability to communicate  Social history: Per chart review, he is living in a nursing home He smoked 0.33 packs/day for 10 years but quit in 2006 No alcohol or IV drug abuse  Review of Systems: Unable to obtain due to patient somnolence  Physical Exam: Blood pressure 163/66, pulse 111, temperature 98.6 F (37 C), temperature source Oral, resp. rate 15, SpO2 98 %. General: resting in bed, withdrawing to pain and answering yes and no to a few questions, in no distress HEENT: eyes tracking left to right once his eyelids are forcefully opened, no scleral icterus, oropharynx without lesions Cardiac: regular rate and rhythm, 3/6 holosystolic murmur loudest at the left lower sternal border Pulm: breathing well, clear to auscultation bilaterally Abd: bowel sounds normal, soft, nondistended, non-tender Ext: warm and well perfused, without pedal edema Lymph: no cervical or supraclavicular lymphadenopathy Skin: ischemic ulcers on the distal left  first and second fingers with 2+ radial pulses bilaterally and good capillary refill Neuro: alert and oriented X0, cranial nerves II-XII grossly intact, not moving any extremities due to inability to follow commands, reflexes 2+ throughout, no myoclonus  Lab results: Basic Metabolic Panel:  Recent Labs  05/07/15 0920 05/07/15 0927  NA 139 141  K 4.5 4.5  CL 106 103  CO2 23  --   GLUCOSE 130* 127*   BUN 20 22*  CREATININE 1.06 1.00  CALCIUM 9.1  --    Liver Function Tests:  Recent Labs  05/07/15 0920  AST 23  ALT 14*  ALKPHOS 77  BILITOT 0.3  PROT 6.7  ALBUMIN 3.2*   CBC:  Recent Labs  05/07/15 0920 05/07/15 0927  WBC 9.9  --   NEUTROABS 8.4*  --   HGB 10.4* 11.9*  HCT 32.4* 35.0*  MCV 96.4  --   PLT 241  --    Coagulation:  Recent Labs  05/07/15 0920  LABPROT 14.6  INR 1.12   Urine Drug Screen: Drugs of Abuse     Component Value Date/Time   LABOPIA NONE DETECTED 04/04/2013 0838   COCAINSCRNUR NONE DETECTED 04/04/2013 0838   LABBENZ NONE DETECTED 04/04/2013 0838   AMPHETMU NONE DETECTED 04/04/2013 0838   THCU NONE DETECTED 04/04/2013 0838   LABBARB NONE DETECTED 04/04/2013 0838    Urinalysis:  Recent Labs  05/07/15 1000  COLORURINE YELLOW  LABSPEC 1.016  PHURINE 6.0  GLUCOSEU NEGATIVE  HGBUR NEGATIVE  BILIRUBINUR NEGATIVE  KETONESUR NEGATIVE  PROTEINUR NEGATIVE  NITRITE NEGATIVE  LEUKOCYTESUR NEGATIVE   Imaging results:  Ct Head Wo Contrast  05/07/2015  CLINICAL DATA:  Unresponsive and hypoglycemia. EXAM: CT HEAD WITHOUT CONTRAST TECHNIQUE: Contiguous axial images were obtained from the base of the skull through the vertex without intravenous contrast. COMPARISON:  04/19/2013 FINDINGS: Stable moderately advanced small vessel disease and bilateral deep old white matter lacunar infarcts. The brain demonstrates no evidence of hemorrhage, infarction, edema, mass effect, extra-axial fluid collection, hydrocephalus or mass lesion. The skull is unremarkable. IMPRESSION: Stable advanced small vessel disease and deep white matter lacunar infarcts. No acute findings by head CT. Electronically Signed   By: Aletta Edouard M.D.   On: 05/07/2015 10:52   Dg Chest Port 1 View  05/07/2015  CLINICAL DATA:  65 year old male with a history of altered mental status EXAM: PORTABLE CHEST 1 VIEW COMPARISON:  04/18/2013, 04/04/2013 FINDINGS: Cardiomediastinal  silhouette unchanged in size and contour. No evidence of pulmonary vascular congestion.  Nine Cardiac event recorder again projects over the left mediastinum. No confluent airspace disease or pneumothorax.  No pleural effusion. No displaced fracture. IMPRESSION: No radiographic evidence of acute cardiopulmonary disease. Signed, Dulcy Fanny. Earleen Newport, DO [facility] Electronically Signed   By: Corrie Mckusick D.O.   On: 05/07/2015 09:37   Other results: EKG: normal sinus rhythm, normal axis, left ventricular hypertrophy, without ischemic changes. I have personally reviewed and interpreted this EKG.  Assessment & Plan by Problem:  Mr. Tubby is a 65 year old man with an ischemic stroke in March 2015, hypertension, and hyperlipidemia, presenting with acute-onset somnolence this morning after he was found unresponsive at his SNF with a blood glucose of 40. At this point, the differential for his acute somnolence remains broad. Given his impressive hypertension and tachycardia, hypertensive encephalopathy and acute ischemic stroke are high on the differential and will be investigated with brain MRI. We will judiciously control his hypertension  with a systolic goal between 660-600 until we get more information. Given his hypoglycemia, new murmur suggestive of tricuspid regurgitation, and necrotic ulcers on his fingertips suggestive of Olser nodes, we will investigate infective endocarditis with a transthoracic echocardiogram and blood cultures. He is hemodynamically stable, without leukocytosis, so we will hold on broad-spectrum antibiotics for now. Given his somnolence, history of previous stroke, and eyes tracking left to right, we will investigate for stroke with EEG if the above work-up is non-revealing. Although he is hypertensive and tachycardic and takes risperidone, I don't think this is serotonin syndrome as he is afebrile and does not have myoclonus.  Additionally, I do not think this is alcohol withdrawal as he is only getting small amounts of benzodiazepines at his nursing home.   Acute-onset somnolence and confusion: Per above, the differential includes hypertensive encephalopathy, acute ischemic stroke, infective endocarditis, sepsis, or seizure. -Ordered brain MRI -Ordered transthoracic echocardiogram -Ordered blood cultures -Ordered ESR/CRP -Ordered HIV and hepatitis C -Will consider EEG if the above work-up is non-revealing  Distal fingertip ulcers: Per above, this may represent Olser's nodes, a finding of infectious endocarditis or sepsis. I doubt this is peripheral vascular disease as he has good radial pulses and capillary refill. Other considerations are cryglobulinemia related to hepatitis C, HIV, or an autoimmune condition, although he does not have other findings suggestive of the former. -Work-up per above  History of ischemic stroke: Per above, recurrent stroke remains on the differential and will be sorted out with MRI. -Will change atorvastatin 37m to 833mdaily upon discharge -Will change aspirin 32554maily to 82m9mily  Hypertension: Per above, PRESS and acute ischemic stroke both remain on the differential, so we will shoot for systolic goal between [phone] home, he is on lisinopril 10mg84mly and amlodipine 10mg 90my. -Hydralazine 5mg as52meded for systolic pressurSFSELTRV>202stolic pressurBXIDHWYS>168ng lisinopril 10mg an58mlodipine 10mg dai39mInsulin-dependent type 2 diabetes: Last a1c 11.5 in 2015. He is on detemir 45U nightly and 10U in the morning with sliding scale. -Sensitive sliding scale with HS coverage for now -Holding basal insulin for now given hypoglycemia -Checking a1c  Dispo: Disposition is deferred at this time, awaiting improvement of current medical problems. Anticipated discharge in approximately 2-5 day(s).   The patient does have a current PCP [name].  The patient does have transportation limitations that hinder transportation to clinic appointments.  Signed: Rondall Radigan FlorLoleta Higgins/2017, 2:29 PM

## 2015-05-07 NOTE — ED Notes (Signed)
Patient transported to CT 

## 2015-05-07 NOTE — Progress Notes (Signed)
Patient admitted to room 5C15 at 1855. Alert and in stable condition.

## 2015-05-07 NOTE — ED Notes (Signed)
Per EMS- pt was found at 7am to be unresponsive. Pt had CBG of 40 but was somehow given juice and 1.5 hours later cbg was 142. Pt remained unresponsive and EMS was called. Upon arrival pt was unresponsive with no eye opening. Pt arrived with eyes opening. CBG wdl.

## 2015-05-07 NOTE — ED Notes (Signed)
Pt remains monitored by blood pressure, pulse ox, and 12 lead. Pt has visitors at bedside.

## 2015-05-07 NOTE — ED Provider Notes (Signed)
CSN: [id number]     Arrival date & time 05/07/15  1191 History   First MD Initiated Contact with Patient 05/07/15 0912     Chief Complaint  Patient presents with  . Altered Mental Status     (Consider location/radiation/quality/duration/timing/severity/associated sxs/prior Treatment) Patient is a 65 y.o. male presenting with altered mental status.  Altered Mental Status  patient is a 65 year old male with history of stroke, lives in the skilled nursing facility, presenting today with altered mental status. The patient reportedly was difficult to wake up this morning, his blood sugar was checked by the facility and found to be 40. They gave him some juice, and checked an hour later, and his sugar was 140, but he still would not wake up. EMS was called, and found him just difficult to arouse, but in no distress. There is no reported history of trauma, no fever, no cough, and no respiratory distress. He does have a history of multiple stroke, and has had hypertensive encephalopathy in the past.   Past Medical History  Diagnosis Date  . Hypertension   . Diabetes mellitus without complication (HCC)   . Stroke [facility]) 03/2013    Multiple infarcts  . Hypertensive crisis 02/15/2013  . Toe ulcer (HCC)   . Anemia   . Dysphagia   . Hyperlipidemia   . Dementia     unspecified w/o behavioral disturbance   Past Surgical History  Procedure Laterality Date  . Back surgery    . Tonsillectomy    . Tee without cardioversion N/A 04/07/2013    Procedure: TRANSESOPHAGEAL ECHOCARDIOGRAM (TEE);  Surgeon: Lars Masson, MD;  Location: [facility] ENDOSCOPY;  Service: Cardiovascular;  Laterality: N/A;  . Loop recorder implant  04/08/2013    MDT LinQ implanted by Dr Ladona Ridgel for cryptogenic stroke  . Loop recorder implant N/A 04/08/2013    Procedure: LOOP RECORDER IMPLANT;  Surgeon: Marinus Maw, MD;  Location: [facility] CATH LAB;  Service: Cardiovascular;  Laterality: N/A;   Family History  Problem Relation Age of Onset   . Family history unknown: Yes   Social History  Substance Use Topics  . Smoking status: Former Smoker -- 0.33 packs/day for 10 years    Types: Cigarettes    Quit date: 01/17/2004  . Smokeless tobacco: Never Used  . Alcohol Use: No     Comment: Denies Currently but previously marked as yes    Review of Systems  Unable to perform ROS: Mental status change      Allergies  Review of patient's allergies indicates no known allergies.  Home Medications   Prior to Admission medications   Medication Sig Start Date End Date Taking? Authorizing Provider  amLODipine (NORVASC) 10 MG tablet Take 1 tablet (10 mg total) by mouth daily. 04/09/13  Yes Ripudeep Jenna Luo, MD  aspirin 325 MG tablet Take 1 tablet (325 mg total) by mouth daily. 04/09/13  Yes Ripudeep Jenna Luo, MD  atorvastatin (LIPITOR) 10 MG tablet Take 10 mg by mouth daily.   Yes Historical Provider, MD  Cholecalciferol (VITAMIN D) 2000 units CAPS Take 2,000 Units by mouth daily.   Yes Historical Provider, MD  divalproex (DEPAKOTE) 125 MG DR tablet Take 250 mg by mouth 2 (two) times daily. 12pm and 8pm   Yes Historical Provider, MD  hydrALAZINE (APRESOLINE) 50 MG tablet Take 50 mg by mouth 3 (three) times daily.   Yes Historical Provider, MD  lisinopril (PRINIVIL,ZESTRIL) 10 MG tablet Take 1 tablet (10 mg total) by mouth daily. 04/09/13  Yes Ripudeep Jenna Luo, MD  LORazepam (ATIVAN) 0.5 MG tablet Take 0.5 mg by mouth 2 (two) times daily as needed for anxiety.   Yes Historical Provider, MD  naphazoline (NAPHCON) 0.1 % ophthalmic solution Place 1 drop into both eyes 4 (four) times daily as needed for irritation. 02/16/13  Yes Andrena Mews, DO  risperiDONE (RISPERDAL) 0.25 MG tablet Take 0.25 mg by mouth 2 (two) times daily. 8am and 4pm   Yes Historical Provider, MD  insulin aspart (NOVOLOG) 100 UNIT/ML injection Inject 0-9 Units into the skin 3 (three) times daily with meals. Sliding scale CBG 70 - 120: 0 units CBG 121 - 150: 1 unit,  CBG 151 -  200: 2 units,  CBG 201 - 250: 3 units,  CBG 251 - 300: 5 units,  CBG 301 - 350: 7 units,  CBG 351 - 400: 9 units   CBG > 400: 9 units and notify your MD 04/09/13   Ripudeep Jenna Luo, MD  insulin detemir (LEVEMIR) 100 UNIT/ML injection Inject 10 Units into the skin every morning. And inject 45 units into the skin at bedtime    Historical Provider, MD  Vitamin D, Ergocalciferol, (DRISDOL) 50000 UNITS CAPS capsule Take 50,000 Units by mouth 2 (two) times a week. Vitamin d 3    Historical Provider, MD   BP 156/76 mmHg  Pulse 92  Temp(Src) 98.2 F (36.8 C) (Oral)  Resp 18  SpO2 98% Physical Exam  Constitutional: No distress.  HENT:  Head: Normocephalic and atraumatic.  Eyes: Conjunctivae are normal. Pupils are equal, round, and reactive to light.  Neck: Normal range of motion. Neck supple. No JVD present.  Cardiovascular: Normal rate and regular rhythm.   Pulmonary/Chest: Effort normal and breath sounds normal. No respiratory distress.  Abdominal: Soft. Bowel sounds are normal. He exhibits no distension.  Genitourinary: Penis normal.  Musculoskeletal: Normal range of motion. He exhibits no edema.  Neurological: He is alert.  Eyes open to speech, follows commands, disordered verbal responses.  Skin: Skin is warm and dry.  Psychiatric: He has a normal mood and affect.  Nursing note and vitals reviewed.   ED Course  Procedures (including critical care time) Labs Review Labs Reviewed  MRSA PCR SCREENING - Abnormal; Notable for the following:    MRSA by PCR POSITIVE (*)    All other components within normal limits  CBC WITH DIFFERENTIAL/PLATELET - Abnormal; Notable for the following:    RBC 3.36 (*)    Hemoglobin 10.4 (*)    HCT 32.4 (*)    Neutro Abs 8.4 (*)    All other components within normal limits  COMPREHENSIVE METABOLIC PANEL - Abnormal; Notable for the following:    Glucose, Bld 130 (*)    Albumin 3.2 (*)    ALT 14 (*)    All other components within normal limits   SEDIMENTATION RATE - Abnormal; Notable for the following:    Sed Rate 45 (*)    All other components within normal limits  BASIC METABOLIC PANEL - Abnormal; Notable for the following:    Glucose, Bld 159 (*)    Calcium 8.6 (*)    All other components within normal limits  CBC - Abnormal; Notable for the following:    RBC 2.93 (*)    Hemoglobin 9.1 (*)    HCT 28.4 (*)    All other components within normal limits  VALPROIC ACID LEVEL - Abnormal; Notable for the following:    Valproic Acid Lvl <10 (*)  All other components within normal limits  GLUCOSE, CAPILLARY - Abnormal; Notable for the following:    Glucose-Capillary 61 (*)    All other components within normal limits  GLUCOSE, CAPILLARY - Abnormal; Notable for the following:    Glucose-Capillary 147 (*)    All other components within normal limits  GLUCOSE, CAPILLARY - Abnormal; Notable for the following:    Glucose-Capillary 118 (*)    All other components within normal limits  CBG MONITORING, ED - Abnormal; Notable for the following:    Glucose-Capillary 120 (*)    All other components within normal limits  CBG MONITORING, ED - Abnormal; Notable for the following:    Glucose-Capillary 100 (*)    All other components within normal limits  I-STAT CHEM 8, ED - Abnormal; Notable for the following:    BUN 22 (*)    Glucose, Bld 127 (*)    Hemoglobin 11.9 (*)    HCT 35.0 (*)    All other components within normal limits  CBG MONITORING, ED - Abnormal; Notable for the following:    Glucose-Capillary 156 (*)    All other components within normal limits  CULTURE, BLOOD (ROUTINE X 2)  CULTURE, BLOOD (ROUTINE X 2)  URINALYSIS, ROUTINE W REFLEX MICROSCOPIC (NOT [facility])  PROTIME-INR  C-REACTIVE PROTEIN  HEPATITIS C ANTIBODY (REFLEX)  URINE RAPID DRUG SCREEN, HOSP PERFORMED  GLUCOSE, CAPILLARY  HCV COMMENT:  GLUCOSE, CAPILLARY  GLUCOSE, CAPILLARY  GLUCOSE, CAPILLARY  HIV-1 RNA ULTRAQUANT REFLEX TO GENTYP+  HEMOGLOBIN  A1C  TSH  RPR  CK  CBG MONITORING, ED    Imaging Review Ct Head Wo Contrast  05/07/2015  CLINICAL DATA:  Unresponsive and hypoglycemia. EXAM: CT HEAD WITHOUT CONTRAST TECHNIQUE: Contiguous axial images were obtained from the base of the skull through the vertex without intravenous contrast. COMPARISON:  04/19/2013 FINDINGS: Stable moderately advanced small vessel disease and bilateral deep old white matter lacunar infarcts. The brain demonstrates no evidence of hemorrhage, infarction, edema, mass effect, extra-axial fluid collection, hydrocephalus or mass lesion. The skull is unremarkable. IMPRESSION: Stable advanced small vessel disease and deep white matter lacunar infarcts. No acute findings by head CT. Electronically Signed   By: Irish LackGlenn  Yamagata M.D.   On: 05/07/2015 10:52   Mr Brain Wo Contrast  05/07/2015  CLINICAL DATA:  65 year old male with previous infarct in 2015. Acute onset decreased mental status and confusion. Initial encounter. EXAM: MRI HEAD WITHOUT CONTRAST TECHNIQUE: Multiplanar, multiecho pulse sequences of the brain and surrounding structures were obtained without intravenous contrast. COMPARISON:  Head CT without contrast 1044 hours today and earlier. FINDINGS: Major intracranial vascular flow voids are stable. No restricted diffusion or evidence of acute infarction. Extensive chronic small vessel infarcts in the bilateral cerebellar hemispheres, brainstem, thalami, and bilateral corona radiata. Basal ganglia less affected. Minimal associated chronic micro hemorrhages. Since 2015, there has been no significant change. No midline shift, mass effect, evidence of mass lesion, ventriculomegaly, extra-axial collection or acute intracranial hemorrhage. Cervicomedullary junction and pituitary are within normal limits. Stable visualized cervical spine. Visible internal auditory structures appear normal. Stable trace mastoid fluid. Mild to moderate paranasal sinus mucosal thickening is  stable. Trace retained secretions in the nasopharynx. Negative orbit and scalp soft tissues. Visualized bone marrow signal is within normal limits. IMPRESSION: 1.  No acute intracranial abnormality. 2. Advanced chronic small vessel ischemic disease appears stable since 2015. Electronically Signed   By: Odessa FlemingH  Hall M.D.   On: 05/07/2015 17:16   Dg Chest Port 1  View  05/07/2015  CLINICAL DATA:  65 year old male with a history of altered mental status EXAM: PORTABLE CHEST 1 VIEW COMPARISON:  04/18/2013, 04/04/2013 FINDINGS: Cardiomediastinal silhouette unchanged in size and contour. No evidence of pulmonary vascular congestion.  Nine Cardiac event recorder again projects over the left mediastinum. No confluent airspace disease or pneumothorax.  No pleural effusion. No displaced fracture. IMPRESSION: No radiographic evidence of acute cardiopulmonary disease. Signed, Yvone Neu. Loreta Ave, DO [facility] Electronically Signed   By: Gilmer Mor D.O.   On: 05/07/2015 09:37   I have personally reviewed and evaluated these images and lab results as part of my medical decision-making.   EKG Interpretation   Date/Time:  Friday May 07 2015 09:08:33 EDT Ventricular Rate:  101 PR Interval:  188 QRS Duration: 93 QT Interval:  361 QTC Calculation: 468 R Axis:   61 Text Interpretation:  Sinus tachycardia Probable LVH with secondary repol  abnrm No significant change since last tracing Confirmed by Denton Lank  MD,  Caryn Bee ([id number]) on 05/07/2015 9:11:48 AM      MDM   Final diagnoses:  Altered mental status, unspecified altered mental status type   65 year old male here with altered mental status. Initially patient was hyperglycemic, however after correcting his glucose, patient had no improvement in his mental status. He is afebrile, mildly tachycardic and hypertensive. EKG shows sinus tachycardia with no evidence of ischemia. Labs unremarkable, not  contributory. Troponin negative. Due to concern for hypertensive encephalopathy patient was given IV hydralazine, but he had no improvement in his mental status with improvement in his blood pressure. T scan was obtained does not show any evidence of acute hemorrhage, acute stroke, only chronic stable changes. Patient was observed in the emergency room for approximate 4 hours, and had minimal change in his mental status. He will open his eyes to speech now, and is able to mumble, but still is far off his baseline mental status. I have spoken with his brother and brother-in-law, but whom said yesterday he was in his normal state of health, and could have a conversation, sit up and dress himself. Concerned he may have had a subacute stroke. We'll plan to admit for observation.    Erskine Emery, MD 05/08/15 1550  Cathren Laine, MD 05/09/15 [phone]

## 2015-05-08 DIAGNOSIS — R4182 Altered mental status, unspecified: Secondary | ICD-10-CM

## 2015-05-08 DIAGNOSIS — R29898 Other symptoms and signs involving the musculoskeletal system: Secondary | ICD-10-CM

## 2015-05-08 DIAGNOSIS — R258 Other abnormal involuntary movements: Secondary | ICD-10-CM

## 2015-05-08 LAB — CBC
HCT: 28.4 % — ABNORMAL LOW (ref 39.0–52.0)
HEMOGLOBIN: 9.1 g/dL — AB (ref 13.0–17.0)
MCH: 31.1 pg (ref 26.0–34.0)
MCHC: 32 g/dL (ref 30.0–36.0)
MCV: 96.9 fL (ref 78.0–100.0)
Platelets: 222 10*3/uL (ref 150–400)
RBC: 2.93 MIL/uL — ABNORMAL LOW (ref 4.22–5.81)
RDW: 13.1 % (ref 11.5–15.5)
WBC: 8.6 10*3/uL (ref 4.0–10.5)

## 2015-05-08 LAB — BASIC METABOLIC PANEL
ANION GAP: 9 (ref 5–15)
BUN: 14 mg/dL (ref 6–20)
CALCIUM: 8.6 mg/dL — AB (ref 8.9–10.3)
CO2: 26 mmol/L (ref 22–32)
Chloride: 104 mmol/L (ref 101–111)
Creatinine, Ser: 0.99 mg/dL (ref 0.61–1.24)
GFR calc Af Amer: >60 mL/min (ref >60)
GLUCOSE: 159 mg/dL — AB (ref 65–99)
Potassium: 3.7 mmol/L (ref 3.5–5.1)
Sodium: 139 mmol/L (ref 135–145)

## 2015-05-08 LAB — MRSA PCR SCREENING: MRSA by PCR: POSITIVE — AB

## 2015-05-08 LAB — GLUCOSE, CAPILLARY
GLUCOSE-CAPILLARY: 147 mg/dL — AB (ref 65–99)
GLUCOSE-CAPILLARY: 61 mg/dL — AB (ref 65–99)
GLUCOSE-CAPILLARY: 88 mg/dL (ref 65–99)
GLUCOSE-CAPILLARY: 90 mg/dL (ref 65–99)
GLUCOSE-CAPILLARY: 148 mg/dL — AB (ref 65–99)
Glucose-Capillary: 118 mg/dL — ABNORMAL HIGH (ref 65–99)
Glucose-Capillary: 80 mg/dL (ref 65–99)
Glucose-Capillary: 89 mg/dL (ref 65–99)

## 2015-05-08 LAB — RAPID URINE DRUG SCREEN, HOSP PERFORMED
Amphetamines: NOT DETECTED
BARBITURATES: NOT DETECTED
Benzodiazepines: NOT DETECTED
COCAINE: NOT DETECTED
OPIATES: NOT DETECTED
TETRAHYDROCANNABINOL: NOT DETECTED

## 2015-05-08 LAB — HIV-1 RNA ULTRAQUANT REFLEX TO GENTYP+
HIV-1 RNA BY PCR: <20 copies/mL
HIV-1 RNA Quant, Log: UNDETERMINED log10copy/mL

## 2015-05-08 LAB — CK: CK TOTAL: 312 U/L (ref 49–397)

## 2015-05-08 LAB — HEPATITIS C ANTIBODY (REFLEX)

## 2015-05-08 LAB — HCV COMMENT:

## 2015-05-08 LAB — TSH: TSH: 1.008 u[IU]/mL (ref 0.350–4.500)

## 2015-05-08 MED ORDER — KCL IN DEXTROSE-NACL 20-5-0.45 MEQ/L-%-% IV SOLN
INTRAVENOUS | Status: DC
  Administered 2015-05-08 – 2015-05-10 (×5): via INTRAVENOUS
  Filled 2015-05-08 (×9): qty 1000

## 2015-05-08 MED ORDER — DEXTROSE 50 % IV SOLN
INTRAVENOUS | Status: AC
  Administered 2015-05-08: 25 mL
  Filled 2015-05-08: qty 50

## 2015-05-08 MED ORDER — BACLOFEN 10 MG PO TABS
10.0000 mg | ORAL_TABLET | Freq: Every day | ORAL | Status: DC
  Administered 2015-05-09: 10 mg via ORAL
  Filled 2015-05-08 (×2): qty 1

## 2015-05-08 NOTE — Progress Notes (Signed)
Patient ID: Manuel Higgins, male   DOB: 1950-12-10, 65 y.o.   MRN: [id number]   Subjective: Manuel Higgins continue to be very somnolent. He is able to say "yes" and "no" to indicate he is not in pain, but is not oriented to self, place, nor time.  Objective: Vital signs in last 24 hours: Filed Vitals:   05/08/15 0128 05/08/15 0330 05/08/15 0525 05/08/15 1123  BP: 125/65 151/78 128/67 151/70  Pulse: 78 77 77 86  Temp: 97.7 F (36.5 C) 98 F (36.7 C) 98 F (36.7 C) 98.2 F (36.8 C)  TempSrc: Axillary Axillary Axillary Oral  Resp: 18 18 18 18   SpO2: 98% 98% 97% 94%    General: resting in bed, withdrawing to pain and answering yes and no to a few questions, in no distress HEENT: no scleral icterus, oropharynx without lesions Cardiac: regular rate and rhythm, no murmur today Pulm: breathing well, clear to auscultation bilaterally Abd: bowel sounds normal, soft, nondistended, non-tender Ext: warm and well perfused, without pedal edema Lymph: no cervical or supraclavicular lymphadenopathy Skin: ischemic ulcers on the distal left first and second fingers with 2+ radial pulses bilaterally and good capillary refill Neuro: alert and oriented X0, eyes tracking horizontally from left to right once his eyelids are forcefully opened; this is not nystagmus, otherwise unremarkable cranial nerves, not moving any extremities due to inability to follow commands; he is quite rigid in all extremities; when I raise an arm or leg, he slowly returns it back to the bed,   Lab Results: Basic Metabolic Panel:  Recent Labs Lab 05/07/15 0920 05/07/15 0927 05/08/15 0236  NA 139 141 139  K 4.5 4.5 3.7  CL 106 103 104  CO2 23  --  26  GLUCOSE 130* 127* 159*  BUN 20 22* 14  CREATININE 1.06 1.00 0.99  CALCIUM 9.1  --  8.6*   Liver Function Tests:  Recent Labs Lab 05/07/15 0920  AST 23  ALT 14*  ALKPHOS 77  BILITOT 0.3  PROT 6.7  ALBUMIN 3.2*   CBC:  Recent Labs Lab 05/07/15 0920 05/07/15 0927  05/08/15 0236  WBC 9.9  --  8.6  NEUTROABS 8.4*  --   --   HGB 10.4* 11.9* 9.1*  HCT 32.4* 35.0* 28.4*  MCV 96.4  --  96.9  PLT 241  --  222   Urine Drug Screen: Drugs of Abuse     Component Value Date/Time   LABOPIA NONE DETECTED 05/07/2015 0001   COCAINSCRNUR NONE DETECTED 05/07/2015 0001   LABBENZ NONE DETECTED 05/07/2015 0001   AMPHETMU NONE DETECTED 05/07/2015 0001   THCU NONE DETECTED 05/07/2015 0001   LABBARB NONE DETECTED 05/07/2015 0001    Urinalysis:  Recent Labs Lab 05/07/15 1000  COLORURINE YELLOW  LABSPEC 1.016  PHURINE 6.0  GLUCOSEU NEGATIVE  HGBUR NEGATIVE  BILIRUBINUR NEGATIVE  KETONESUR NEGATIVE  PROTEINUR NEGATIVE  NITRITE NEGATIVE  LEUKOCYTESUR NEGATIVE   Micro Results: Recent Results (from the past 240 hour(s))  Culture, blood (routine x 2)     Status: None (Preliminary result)   Collection Time: 05/07/15  3:31 PM  Result Value Ref Range Status   Specimen Description BLOOD RIGHT HAND  Final   Special Requests BOTTLES DRAWN AEROBIC ONLY 5CC  Final   Culture NO GROWTH < 24 HOURS  Final   Report Status PENDING  Incomplete  Culture, blood (routine x 2)     Status: None (Preliminary result)   Collection Time: 05/07/15  3:45 PM  Result Value Ref Range Status   Specimen Description BLOOD LEFT HAND  Final   Special Requests BOTTLES DRAWN AEROBIC AND ANAEROBIC 5CC  Final   Culture NO GROWTH < 24 HOURS  Final   Report Status PENDING  Incomplete  MRSA PCR Screening     Status: Abnormal   Collection Time: 05/08/15  3:19 AM  Result Value Ref Range Status   MRSA by PCR POSITIVE (A) NEGATIVE Final    Comment:        The GeneXpert MRSA Assay (FDA approved for NASAL specimens only), is one component of a comprehensive MRSA colonization surveillance program. It is not intended to diagnose MRSA infection nor to guide or monitor treatment for MRSA infections. RESULT CALLED TO, READ BACK BY AND VERIFIED WITH: S WHALEY,RN @0757  05/08/15 MKELLY     Studies/Results: Ct Head Wo Contrast  05/07/2015  CLINICAL DATA:  Unresponsive and hypoglycemia. EXAM: CT HEAD WITHOUT CONTRAST TECHNIQUE: Contiguous axial images were obtained from the base of the skull through the vertex without intravenous contrast. COMPARISON:  04/19/2013 FINDINGS: Stable moderately advanced small vessel disease and bilateral deep old white matter lacunar infarcts. The brain demonstrates no evidence of hemorrhage, infarction, edema, mass effect, extra-axial fluid collection, hydrocephalus or mass lesion. The skull is unremarkable. IMPRESSION: Stable advanced small vessel disease and deep white matter lacunar infarcts. No acute findings by head CT. Electronically Signed   By: Aletta Edouard M.D.   On: 05/07/2015 10:52   Mr Brain Wo Contrast  05/07/2015  CLINICAL DATA:  65 year old male with previous infarct in 2015. Acute onset decreased mental status and confusion. Initial encounter. EXAM: MRI HEAD WITHOUT CONTRAST TECHNIQUE: Multiplanar, multiecho pulse sequences of the brain and surrounding structures were obtained without intravenous contrast. COMPARISON:  Head CT without contrast 1044 hours today and earlier. FINDINGS: Major intracranial vascular flow voids are stable. No restricted diffusion or evidence of acute infarction. Extensive chronic small vessel infarcts in the bilateral cerebellar hemispheres, brainstem, thalami, and bilateral corona radiata. Basal ganglia less affected. Minimal associated chronic micro hemorrhages. Since 2015, there has been no significant change. No midline shift, mass effect, evidence of mass lesion, ventriculomegaly, extra-axial collection or acute intracranial hemorrhage. Cervicomedullary junction and pituitary are within normal limits. Stable visualized cervical spine. Visible internal auditory structures appear normal. Stable trace mastoid fluid. Mild to moderate paranasal sinus mucosal thickening is stable. Trace retained secretions in the  nasopharynx. Negative orbit and scalp soft tissues. Visualized bone marrow signal is within normal limits. IMPRESSION: 1.  No acute intracranial abnormality. 2. Advanced chronic small vessel ischemic disease appears stable since 2015. Electronically Signed   By: Genevie Ann M.D.   On: 05/07/2015 17:16   Dg Chest Port 1 View  05/07/2015  CLINICAL DATA:  65 year old male with a history of altered mental status EXAM: PORTABLE CHEST 1 VIEW COMPARISON:  04/18/2013, 04/04/2013 FINDINGS: Cardiomediastinal silhouette unchanged in size and contour. No evidence of pulmonary vascular congestion.  Nine Cardiac event recorder again projects over the left mediastinum. No confluent airspace disease or pneumothorax.  No pleural effusion. No displaced fracture. IMPRESSION: No radiographic evidence of acute cardiopulmonary disease. Signed, Dulcy Fanny. Earleen Newport, DO [facility] Radiology Electronically Signed   By: Corrie Mckusick D.O.   On: 05/07/2015 09:37   Medications: I have reviewed the patient's current medications. Scheduled Meds: . enoxaparin (LOVENOX) injection  40 mg Subcutaneous Q24H  . insulin aspart  0-15 Units Subcutaneous Q4H  . sodium chloride flush  3  mL Intravenous Q12H  . valproate sodium  250 mg Intravenous Q12H   Continuous Infusions:  PRN Meds:.hydrALAZINE   Assessment/Plan:  Manuel Higgins is a 65 year old man with an ischemic stroke in March 2015 presenting with acute-onset somnolence and confusion, found to be rigid in all of his muscles with horizontally-tracking eyes along with necrotic ulcers on his fingertips. A bleed, recurrent stroke, and PRESS have been ruled out with unchanged brain CT and MRI. At this point, the differential includes non-convulsive status epilepticus given his muscle rigidity, horizontal-tracking gaze, sub-therapeutic Depakote level, and hypoglycemia;  Neuroleptic malignant syndrome given his rigidity, initial hypertensive urgency and  tachycardia albeit without fever, and history of taking riseperidonn; lastly, psychiatric-catatonia given his odd propensity to slowly drop his extremities back to the bed upon passively being raised. Infectious encephalitis is another consideration and may need to be further evaluated with a lumbar puncture. Inflammatory markers were normal, making autoimmune diseases less likely, although he does have odd necrotic ulcerations on his fingertips suggestive of progressive systemic sclerosis, Raynaud's, cryoglobulinemia, or Buerger's. I do not think this is serotonin syndrome as he is not on any serotinergic agents and he does not have myoclonus nor fevers.We will consult Neurology for further assistance given the complexity of the case. For now, we will address the differential by obtaining an EEG, ordering CK, RPR, and TSH, holding Depakote and risperidone, and considering Psychiatric consultation pending Neurology's assessment.  Acute-onset somnolence, confusion, and rigidity: Per above, the differential includes non-convulsive status epilepticus, neuromalignant syndrome, infectious encephalitis, and catatonia related to psychiatric disease. -Consulted Neurology; thank you for your help in this complicated case -Holding risperidone and Depakote -Ordered EEG -Ordered transthoracic echocardiogram -Ordered CK, TSH, and RPR -Follow blood cultures -Follow ESR/CRP -Follow HIV and hepatitis C -Will consider psychiatric referral pending Neurology's input  Distal fingertip ulcers: Per above, this may represent Buerger's disease given his smoking history. I doubt this is peripheral vascular disease as he has good radial pulses and capillary refill. Other considerations are cryglobulinemia related to hepatitis C, HIV, or an autoimmune condition, although he does not have other findings suggestive of the former nor elevated inflammatory markers. -Work-up per above  History of ischemic stroke: Per above,  recurrent stroke remains on the differential and will be sorted out with MRI. -Will change atorvastatin 4m to 816mdaily upon discharge -Will change aspirin 32551maily to 77m76mily once he can swallow  Hypertension: He is currently normotensive on no medications. At home, he is on lisinopril 10mg2mly and amlodipine 10mg 41my. -Hydralazine 5mg as46meded for systolic [id number]>150stolic pressurVWPVXYIA>165ng lisinopril 10mg an45mlodipine 10mg dai90mInsulin-dependent type 2 diabetes: Last a1c 11.5 in 2015. He is on detemir 45U nightly and 10U in the morning with sliding scale. -Sensitive sliding scale with HS coverage for now -Holding basal insulin for now given hypoglycemia -Checking a1c  Dispo: Disposition is deferred at this time, awaiting improvement of current medical problems.  Anticipated discharge in approximately 3-5 day(s).   The patient does have a current PCP ([name].  The patient does have transportation limitations that hinder transportation to clinic appointments.  .Services Needed at time of discharge: Y = Yes, Blank = No PT:   OT:   RN:   Equipment:   Other:     LOS: 1 day   Keleigh Kazee FlorLoleta Chance/2017, 11:31 AM

## 2015-05-08 NOTE — Consult Note (Addendum)
Requesting Physician: Dr.  Josem Kaufmann    Reason for consultation:  Altered mental status, increased tone in all extremities  HPI:                                                                                                                                         Manuel Higgins is an 65 y.o. male patient admitted with altered mental status from nursing facility, noted to have hypoglycemia with blood sugars in 40's at the time. His mental status is improved through the admission.     Past Medical History: Past Medical History  Diagnosis Date  . Hypertension   . Diabetes mellitus without complication (HCC)   . Stroke [facility]) 03/2013    Multiple infarcts  . Hypertensive crisis 02/15/2013  . Toe ulcer (HCC)   . Anemia   . Dysphagia   . Hyperlipidemia   . Dementia     unspecified w/o behavioral disturbance    Past Surgical History  Procedure Laterality Date  . Back surgery    . Tonsillectomy    . Tee without cardioversion N/A 04/07/2013    Procedure: TRANSESOPHAGEAL ECHOCARDIOGRAM (TEE);  Surgeon: Lars Masson, MD;  Location: [facility] ENDOSCOPY;  Service: Cardiovascular;  Laterality: N/A;  . Loop recorder implant  04/08/2013    MDT LinQ implanted by Dr Ladona Ridgel for cryptogenic stroke  . Loop recorder implant N/A 04/08/2013    Procedure: LOOP RECORDER IMPLANT;  Surgeon: Marinus Maw, MD;  Location: [facility] CATH LAB;  Service: Cardiovascular;  Laterality: N/A;    Family History: Family History  Problem Relation Age of Onset  . Family history unknown: Yes    Social History:   reports that he quit smoking about 11 years ago. His smoking use included Cigarettes. He has a 3.3 pack-year smoking history. He has never used smokeless tobacco. He reports that he does not drink alcohol or use illicit drugs.  Allergies:  No Known Allergies   Medications:                                                                                                                         Current  facility-administered medications:  .  baclofen (LIORESAL) tablet 10 mg, 10 mg, Oral, QHS, Rayya Yagi Daniel Nones, MD, 10 mg at 05/08/15 2113 .  dextrose 5 % and 0.45 % NaCl with KCl 20 mEq/L infusion, , Intravenous,  Continuous, Selina CooleyKyle Flores, MD, Last Rate: 125 mL/hr at 05/08/15 1639 .  enoxaparin (LOVENOX) injection 40 mg, 40 mg, Subcutaneous, Q24H, Alexa R Burns, MD, 40 mg at 05/08/15 2113 .  hydrALAZINE (APRESOLINE) injection 10 mg, 10 mg, Intravenous, Q8H PRN, Darreld McleanVishal Patel, MD .  insulin aspart (novoLOG) injection 0-15 Units, 0-15 Units, Subcutaneous, Q4H, Alexa Lucrezia Starch Burns, MD, Stopped at 05/08/15 0400 .  sodium chloride flush (NS) 0.9 % injection 3 mL, 3 mL, Intravenous, Q12H, Alexa R Burns, MD, 3 mL at 05/08/15 2115   ROS:                                                                                                                                       History   unobtainable from patient due to mental status and language barrier   Neurologic Examination:                                                                                                    Today's Vitals   05/08/15 1123 05/08/15 1500 05/08/15 1745 05/08/15 2142  BP: 151/70 156/76 187/79 174/77  Pulse: 86 92 84 82  Temp: 98.2 F (36.8 C)     TempSrc: Oral  Oral   Resp: 18 18 18 18   SpO2: 94% 98% 99% 98%    Evaluation of higher integrative functions including: Level of alertness: Alert,  Oriented to place and person, not time Speech: Moderate dysarthria, no aphasia noted.  Test the following cranial nerves: 2-12 grossly intact Motor examination. spastic in all 4 extremities, full motor strength in all 4 extremities Examination of sensation :  Reports symmetric sensation in all 4 extremities   Examination of deep tendon reflexes: 3+,in all extremities, normal plantars bilaterally Test coordination: Normal finger nose testing, with no evidence of limb appendicular ataxia or abnormal involuntary movements or tremors  noted.  Gait: Deferred   Lab Results: Basic Metabolic Panel:  Recent Labs Lab 05/07/15 0920 05/07/15 0927 05/08/15 0236  NA 139 141 139  K 4.5 4.5 3.7  CL 106 103 104  CO2 23  --  26  GLUCOSE 130* 127* 159*  BUN 20 22* 14  CREATININE 1.06 1.00 0.99  CALCIUM 9.1  --  8.6*    Liver Function Tests:  Recent Labs Lab 05/07/15 0920  AST 23  ALT 14*  ALKPHOS 77  BILITOT 0.3  PROT 6.7  ALBUMIN 3.2*   No results for input(s): LIPASE, AMYLASE in the last 168 hours. No results for  input(s): AMMONIA in the last 168 hours.  CBC:  Recent Labs Lab 05/07/15 0920 05/07/15 0927 05/08/15 0236  WBC 9.9  --  8.6  NEUTROABS 8.4*  --   --   HGB 10.4* 11.9* 9.1*  HCT 32.4* 35.0* 28.4*  MCV 96.4  --  96.9  PLT 241  --  222    Cardiac Enzymes:  Recent Labs Lab 05/08/15 1453  CKTOTAL 312    Lipid Panel: No results for input(s): CHOL, TRIG, HDL, CHOLHDL, VLDL, LDLCALC in the last 168 hours.  CBG:  Recent Labs Lab 05/08/15 0832 05/08/15 1102 05/08/15 1206 05/08/15 1620 05/08/15 2036  GLUCAP 88 90 80 89 148*    Microbiology: Results for orders placed or performed during the hospital encounter of 05/07/15  Culture, blood (routine x 2)     Status: None (Preliminary result)   Collection Time: 05/07/15  3:31 PM  Result Value Ref Range Status   Specimen Description BLOOD RIGHT HAND  Final   Special Requests BOTTLES DRAWN AEROBIC ONLY 5CC  Final   Culture NO GROWTH < 24 HOURS  Final   Report Status PENDING  Incomplete  Culture, blood (routine x 2)     Status: None (Preliminary result)   Collection Time: 05/07/15  3:45 PM  Result Value Ref Range Status   Specimen Description BLOOD LEFT HAND  Final   Special Requests BOTTLES DRAWN AEROBIC AND ANAEROBIC 5CC  Final   Culture NO GROWTH < 24 HOURS  Final   Report Status PENDING  Incomplete  MRSA PCR Screening     Status: Abnormal   Collection Time: 05/08/15  3:19 AM  Result Value Ref Range Status   MRSA by PCR  POSITIVE (A) NEGATIVE Final    Comment:        The GeneXpert MRSA Assay (FDA approved for NASAL specimens only), is one component of a comprehensive MRSA colonization surveillance program. It is not intended to diagnose MRSA infection nor to guide or monitor treatment for MRSA infections. RESULT CALLED TO, READ BACK BY AND VERIFIED WITH: S [facility] @[id number]  05/08/15 MKELLY      Imaging: Ct Head Wo Contrast  05/07/2015  CLINICAL DATA:  Unresponsive and hypoglycemia. EXAM: CT HEAD WITHOUT CONTRAST TECHNIQUE: Contiguous axial images were obtained from the base of the skull through the vertex without intravenous contrast. COMPARISON:  04/19/2013 FINDINGS: Stable moderately advanced small vessel disease and bilateral deep old white matter lacunar infarcts. The brain demonstrates no evidence of hemorrhage, infarction, edema, mass effect, extra-axial fluid collection, hydrocephalus or mass lesion. The skull is unremarkable. IMPRESSION: Stable advanced small vessel disease and deep white matter lacunar infarcts. No acute findings by head CT. Electronically Signed   By: Irish Lack M.D.   On: 05/07/2015 10:52   Mr Brain Wo Contrast  05/07/2015  CLINICAL DATA:  65 year old male with previous infarct in 2015. Acute onset decreased mental status and confusion. Initial encounter. EXAM: MRI HEAD WITHOUT CONTRAST TECHNIQUE: Multiplanar, multiecho pulse sequences of the brain and surrounding structures were obtained without intravenous contrast. COMPARISON:  Head CT without contrast 1044 hours today and earlier. FINDINGS: Major intracranial vascular flow voids are stable. No restricted diffusion or evidence of acute infarction. Extensive chronic small vessel infarcts in the bilateral cerebellar hemispheres, brainstem, thalami, and bilateral corona radiata. Basal ganglia less affected. Minimal associated chronic micro hemorrhages. Since 2015, there has been no significant change. No midline shift, mass  effect, evidence of mass lesion, ventriculomegaly, extra-axial collection or acute intracranial hemorrhage. Cervicomedullary  junction and pituitary are within normal limits. Stable visualized cervical spine. Visible internal auditory structures appear normal. Stable trace mastoid fluid. Mild to moderate paranasal sinus mucosal thickening is stable. Trace retained secretions in the nasopharynx. Negative orbit and scalp soft tissues. Visualized bone marrow signal is within normal limits. IMPRESSION: 1.  No acute intracranial abnormality. 2. Advanced chronic small vessel ischemic disease appears stable since 2015. Electronically Signed   By: Odessa Fleming M.D.   On: 05/07/2015 17:16   Dg Chest Port 1 View  05/07/2015  CLINICAL DATA:  65 year old male with a history of altered mental status EXAM: PORTABLE CHEST 1 VIEW COMPARISON:  04/18/2013, 04/04/2013 FINDINGS: Cardiomediastinal silhouette unchanged in size and contour. No evidence of pulmonary vascular congestion.  Nine Cardiac event recorder again projects over the left mediastinum. No confluent airspace disease or pneumothorax.  No pleural effusion. No displaced fracture. IMPRESSION: No radiographic evidence of acute cardiopulmonary disease. Signed, Yvone Neu. Loreta Ave, DO [facility] Radiology Electronically Signed   By: Gilmer Mor D.O.   On: 05/07/2015 09:37    Assessment and plan:   Holt Woolbright is an 65 y.o. male patient who presented with Altered mental status, noted to have spasticity in all extremities. His MRI brain on my personal review showed extensive demyelinating lesions in the periventricular location perpendicular to ventricular axis in a typical dawson's fingers pattern, which does raise suspicion for a primary demanding condition such as multiple sclerosis which could be the cause for the spasticity. There is also some intermittent chewing movements noted suggestive of tardive dyskinesia which can  be seen with chronic antipsychotic medication use. I do not have information about his past medical problems and antipsychotic medication use. If that is the case, there could be an overlapping rigidity from the extra pyramidal side effects of chronic antipsychotic medication use contributing to the increased tone in the limbs. Recommend obtaining his past records about any use of antipsychotic medications.  At this time, recommend starting baclofen 10 mg at bedtime to help with the spasticity.  We will obtain an EEG on Monday for further evaluation. Physical and occupation therapy.   We'll follow-up

## 2015-05-08 NOTE — Evaluation (Signed)
Clinical/Bedside Swallow Evaluation Patient Details  Name: Breydon Senters MRN: [id number] Date of Birth: 1950/12/15  Today's Date: 05/08/2015 Time: SLP Start Time (ACUTE ONLY): 9147 SLP Stop Time (ACUTE ONLY): 0952 SLP Time Calculation (min) (ACUTE ONLY): 14 min  Past Medical History:  Past Medical History  Diagnosis Date  . Hypertension   . Diabetes mellitus without complication (HCC)   . Stroke [facility]) 03/2013    Multiple infarcts  . Hypertensive crisis 02/15/2013  . Toe ulcer (HCC)   . Anemia   . Dysphagia   . Hyperlipidemia   . Dementia     unspecified w/o behavioral disturbance   Past Surgical History:  Past Surgical History  Procedure Laterality Date  . Back surgery    . Tonsillectomy    . Tee without cardioversion N/A 04/07/2013    Procedure: TRANSESOPHAGEAL ECHOCARDIOGRAM (TEE);  Surgeon: Lars Masson, MD;  Location: [facility] ENDOSCOPY;  Service: Cardiovascular;  Laterality: N/A;  . Loop recorder implant  04/08/2013    MDT LinQ implanted by Dr Ladona Ridgel for cryptogenic stroke  . Loop recorder implant N/A 04/08/2013    Procedure: LOOP RECORDER IMPLANT;  Surgeon: Marinus Maw, MD;  Location: [facility] CATH LAB;  Service: Cardiovascular;  Laterality: N/A;   HPI:  Mr. Hinshaw is a 65 year old man with ischemic stroke back in March 2015 on aspirin  daily, insulin-dependent type 2 diabetes, hypertension, and hyperlipidemia presenting with acute-onset somnolence and confusion.  Current MRI showing no acute events but advanced chronic small vessel ischemic disease.  Current chest x-ray is negative for acute findings.  Patient with previous swallow work up with most recent recommendation on 04/21/2013 for a dysphagia 3 diet with thin liquids.     Assessment / Plan / Recommendation Clinical Impression  Clinical swallowing evaluation was completed.  The patient was very sleepy when ST entered the room.  After his bed was sat up he became more alert but was still sleepy.  Limited oral mechanism  exam completed due to issues following commands even given visual cues.  Patient's speech was noted to be dysarthric - question if this is baseline from previous CVA in 2015 when it was documented that the patient had dysarthria.  Today the patient presented with oropharyngeal dysphagia characterized by delayed oral transit, decreased labial seal with anterior escape given liquids with no awareness and delayed swallow trigger.  The patient required cues to swallow consistently.  Given the patient's current condition he is at risk for aspiration.  Hopefully as his alertness improves we will see improvement in his swallow function.  Recommend keeping the patient NPO except meds crushed in purees pending re evaluation.     Aspiration Risk  Moderate aspiration risk    Diet Recommendation   NPO except meds crushed in puree.  Medication Administration: Crushed with puree    Other  Recommendations Oral Care Recommendations: Oral care QID Other Recommendations: Have oral suction available   Follow up Recommendations   (TBD)    Frequency and Duration min 2x/week  2 weeks       Prognosis Prognosis for Safe Diet Advancement: Good Barriers to Reach Goals: Cognitive deficits      Swallow Study   General Date of Onset: 05/07/15 HPI: Mr. Hamre is a 65 year old man with ischemic stroke back in March 2015 on aspirin  daily, insulin-dependent type 2 diabetes, hypertension, and hyperlipidemia presenting with acute-onset somnolence and confusion.  Current MRI showing no acute events but advanced chronic small vessel ischemic disease.  Current chest x-ray is negative for acute findings.  Patient with previous swallow work up with most recent recommendation on 04/21/2013 for a dysphagia 3 diet with thin liquids.   Type of Study: Bedside Swallow Evaluation Previous Swallow Assessment: Several clinical swallow evals in 2015.   Diet Prior to this Study: NPO Temperature Spikes Noted:  (One low grade temp  since admission.) Respiratory Status: Room air History of Recent Intubation: No Behavior/Cognition: Cooperative;Requires cueing;Lethargic/Drowsy Oral Cavity Assessment: Within Functional Limits Oral Care Completed by SLP: No Self-Feeding Abilities: Total assist Patient Positioning: Upright in bed Baseline Vocal Quality: Low vocal intensity Volitional Cough: Weak Volitional Swallow: Able to elicit    Oral/Motor/Sensory Function Overall Oral Motor/Sensory Function: Moderate impairment Facial ROM: Reduced left Facial Symmetry: Abnormal symmetry left Facial Strength: Reduced left Lingual ROM: Reduced left;Reduced right Lingual Symmetry: Within Functional Limits Mandible: Within Functional Limits   Ice Chips Ice chips: Impaired Presentation: Spoon Oral Phase Impairments: Impaired mastication;Reduced labial seal Oral Phase Functional Implications: Prolonged oral transit Pharyngeal Phase Impairments: Suspected delayed Swallow   Thin Liquid Thin Liquid: Impaired Presentation: Cup;Spoon Oral Phase Impairments: Reduced labial seal;Impaired mastication Oral Phase Functional Implications: Left anterior spillage;Right anterior spillage;Prolonged oral transit Pharyngeal  Phase Impairments: Suspected delayed Swallow    Nectar Thick Nectar Thick Liquid: Not tested   Honey Thick Honey Thick Liquid: Not tested   Puree Puree: Impaired Presentation: Spoon Oral Phase Impairments: Impaired mastication Oral Phase Functional Implications: Prolonged oral transit Pharyngeal Phase Impairments: Suspected delayed Swallow   Solid   GO   Solid: Not tested    Functional Assessment Tool Used: ASHA NOMS and clinical judgment. Functional Limitations: Swallowing Swallow Current Status [phone](G8996): At least 80 percent but less than 100 percent impaired, limited or restricted Swallow Goal Status [phone](G8997): At least 40 percent but less than 60 percent impaired, limited or restricted  Dimas AguasMelissa Avri Paiva, MA,  CCC-SLP Acute Rehab SLP [phone] Dimas AguasGoodman, Tilley Faeth N 05/08/2015,10:01 AM

## 2015-05-08 NOTE — Progress Notes (Signed)
OT Cancellation Note  Patient Details Name: Manuel Higgins MRN: [id number] DOB: 11/03/1950   Cancelled Treatment:    Reason Eval/Treat Not Completed: Patient not medically ready (Pt with active bedrest orders). Will follow up for OT eval following d/c of bedrest and updated activity orders.  Gaye AlkenBailey A Terrisa Curfman M.S., OTR/L Pager: [phone]  05/08/2015, 10:23 AM

## 2015-05-08 NOTE — Progress Notes (Signed)
Hypoglycemic Event  CBG:61  Treatment: D50 IV 25 mL  Symptoms: None  Follow-up CBG: [id number] CBG Result:147  Possible Reasons for Event: npo  Comments/MD notified:per protocol    Madriaga-Acosta,Alassane Kalafut D

## 2015-05-08 NOTE — Progress Notes (Signed)
PT Cancellation Note  Patient Details Name: Manuel Higgins MRN: [id number] DOB: 06/05/1950   Cancelled Treatment:    Reason Eval/Treat Not Completed: Patient not medically ready. Pt has orders for bedrest. Please update activity order, when appropriate, for PT to proceed with eval. Thank you.   Ilda FoilGarrow, Rajveer Handler Rene 05/08/2015, 8:32 AM

## 2015-05-09 DIAGNOSIS — R471 Dysarthria and anarthria: Secondary | ICD-10-CM

## 2015-05-09 LAB — GLUCOSE, CAPILLARY
GLUCOSE-CAPILLARY: 134 mg/dL — AB (ref 65–99)
GLUCOSE-CAPILLARY: 145 mg/dL — AB (ref 65–99)
GLUCOSE-CAPILLARY: 157 mg/dL — AB (ref 65–99)
Glucose-Capillary: 119 mg/dL — ABNORMAL HIGH (ref 65–99)
Glucose-Capillary: 142 mg/dL — ABNORMAL HIGH (ref 65–99)
Glucose-Capillary: 171 mg/dL — ABNORMAL HIGH (ref 65–99)
Glucose-Capillary: 180 mg/dL — ABNORMAL HIGH (ref 65–99)

## 2015-05-09 LAB — BASIC METABOLIC PANEL
ANION GAP: 11 (ref 5–15)
BUN: 11 mg/dL (ref 6–20)
CHLORIDE: 104 mmol/L (ref 101–111)
CO2: 24 mmol/L (ref 22–32)
Calcium: 9.1 mg/dL (ref 8.9–10.3)
Creatinine, Ser: 0.99 mg/dL (ref 0.61–1.24)
GFR calc Af Amer: >60 mL/min (ref >60)
GLUCOSE: 159 mg/dL — AB (ref 65–99)
POTASSIUM: 4.3 mmol/L (ref 3.5–5.1)
Sodium: 139 mmol/L (ref 135–145)

## 2015-05-09 LAB — CBC
HEMATOCRIT: 30.2 % — AB (ref 39.0–52.0)
HEMOGLOBIN: 10 g/dL — AB (ref 13.0–17.0)
MCH: 31.2 pg (ref 26.0–34.0)
MCHC: 33.1 g/dL (ref 30.0–36.0)
MCV: 94.1 fL (ref 78.0–100.0)
PLATELETS: 252 10*3/uL (ref 150–400)
RBC: 3.21 MIL/uL — AB (ref 4.22–5.81)
RDW: 12.4 % (ref 11.5–15.5)
WBC: 5.3 10*3/uL (ref 4.0–10.5)

## 2015-05-09 LAB — RPR: RPR Ser Ql: NONREACTIVE

## 2015-05-09 MED ORDER — HYDRALAZINE HCL 50 MG PO TABS: 50.0000 mg | ORAL_TABLET | Freq: Three times a day (TID) | ORAL | Status: DC

## 2015-05-09 MED ORDER — HYDRALAZINE HCL 20 MG/ML IJ SOLN: 5.0000 mg | INTRAMUSCULAR | Status: DC | PRN

## 2015-05-09 NOTE — Evaluation (Signed)
Occupational Therapy Evaluation Patient Details Name: Manuel Higgins MRN: [id number] DOB: 07/07/1950 Today's Date: 05/09/2015    History of Present Illness [name] is a 65 year old man with a history of ischemic stroke in 2015, hypertension, hyperlipidemia, and type 2 diabetes who presents with the acute onset of somnolence. He was in his usual state of health until the day prior to admission when his brother visited him in the nursing home and found him to be somnolent. He was arousable and then upon waking up was apparently his baseline self. On the day of admission, he was found to be somnolent by a staff member of the nursing home and this time could not be awoken. A blood sugar at that time was 40 and he was given juice with an increase in his sugar level. He was transported to the emergency department and noted to be hypertensive and somnolent. Initial evaluation including a CT scan of the head and MRI of the head were unremarkable for acute changes.   Clinical Impression   Pt. Was easily arousable and was able to follow directions. Pt. Was not an accurate historian and am not sure of pt. Baseline performance. Pt. Was able to perform bed mobility with Min A required with cues for technique. Pt. Was Min A with UE dressing and was Mod A with LE ADLs. Pt. Has decreased ability with standing tolerance. Pt. Has potential to increase I with ADLs and mobility.     Follow Up Recommendations  SNF    Equipment Recommendations  None recommended by OT    Recommendations for Other Services       Precautions / Restrictions Precautions Precautions: Fall Restrictions Weight Bearing Restrictions: No      Mobility Bed Mobility Overal bed mobility: Needs Assistance Bed Mobility: Supine to Sit;Sit to Supine     Supine to sit: Min assist Sit to supine: Min assist   General bed mobility comments:  (with cues for technique.)  Transfers Overall transfer level: Needs assistance Equipment  used: 1 person hand held assist Transfers: Sit to/from [employer] CorporationStand;Stand Pivot Transfers Sit to Stand: Min assist Stand pivot transfers: Min assist       General transfer comment: Pt. required cues for sequencing and for proper hand placement.     Balance                                            ADL Overall ADL's : Needs assistance/impaired Eating/Feeding: NPO   Grooming: Wash/dry hands;Wash/dry face;Oral care;Minimal assistance   Upper Body Bathing: Supervision/ safety;Set up   Lower Body Bathing: Moderate assistance   Upper Body Dressing : Minimal assistance   Lower Body Dressing: Moderate assistance   Toilet Transfer: Minimal assistance   Toileting- Clothing Manipulation and Hygiene: Moderate assistance         General ADL Comments: Pt. is able to assist with ADLs. Unknown pt. prior level of function.      Vision     Perception     Praxis      Pertinent Vitals/Pain Pain Assessment: No/denies pain     Hand Dominance Right   Extremity/Trunk Assessment Upper Extremity Assessment Upper Extremity Assessment: Generalized weakness           Communication Communication Communication: No difficulties   Cognition Arousal/Alertness: Lethargic Behavior During Therapy: WFL for tasks assessed/performed Overall Cognitive Status: Difficult to assess  Memory: Decreased short-term memory             General Comments       Exercises       Shoulder Instructions      Home Living Family/patient expects to be discharged to:: Skilled nursing facility                                        Prior Functioning/Environment          Comments: unknown. Pt. is not accurate historian.     OT Diagnosis: Generalized weakness   OT Problem List: Decreased strength;Decreased activity tolerance;Decreased safety awareness   OT Treatment/Interventions: Self-care/ADL training;DME and/or AE instruction;Therapeutic  activities    OT Goals(Current goals can be found in the care plan section) Acute Rehab OT Goals Patient Stated Goal:  (go home) OT Goal Formulation: With patient Time For Goal Achievement: 05/23/15 Potential to Achieve Goals: Good ADL Goals Pt Will Perform Lower Body Bathing: with supervision;sit to/from stand Pt Will Perform Upper Body Dressing: with supervision;sitting Pt Will Perform Lower Body Dressing: with supervision;sit to/from stand Pt Will Transfer to Toilet: with supervision;ambulating Pt Will Perform Toileting - Clothing Manipulation and hygiene: with supervision;sit to/from stand  OT Frequency: Min 2X/week   Barriers to D/C:            Co-evaluation              End of Session    Activity Tolerance: Patient tolerated treatment well Patient left: in bed;with call bell/phone within reach;with bed alarm set   Time: 1100-1141 OT Time Calculation (min): 41 min Charges:  OT General Charges $OT Visit: 1 Procedure OT Evaluation $OT Eval Moderate Complexity: 1 Procedure OT Treatments $Self Care/Home Management : 8-22 mins G-Codes:    Manuel Higgins May 28, 2015, 12:00 PM

## 2015-05-09 NOTE — Progress Notes (Signed)
Speech Language Pathology Treatment: Dysphagia  Patient Details Name: Manuel Higgins MRN: [id number] DOB: 11/03/1950 Today's Date: 05/09/2015 Time: [id number] SLP Time Calculation (min) (ACUTE ONLY): 22 min  Assessment / Plan / Recommendation Clinical Impression  Pt with significant change in LOA from initial BSE on 05/08/15 and pt able to safely consume thin via tsp/cup and puree with only minimally delayed initiation of the swallow and minimal verbal/tactile cueing from SLP, but no overt s/s of aspiration; liquids via straw yielded multiple swallows, but pt has an overall delay in processing of information/verbal expression and speech appears dysarthric during conversation (pt had previous CVA in 2015 and this may be residual deficits); impaired mastication and lengthy oral preparation/propulsion noted with solids; Dysphagia 2 and thin liquids without straws recommended; pt was on Dysphagia 3/thin previously; upgrade to previous diet probable with improved mentation.    HPI HPI: [name] is a 65 year old man with ischemic stroke back in March 2015 on aspirin 325mg  daily, insulin-dependent type 2 diabetes, hypertension, and hyperlipidemia presenting with acute-onset somnolence and confusion.  Current MRI showing no acute events but advanced chronic small vessel ischemic disease.  Current chest x-ray is negative for acute findings.  Patient with previous swallow work up with most recent recommendation on 04/21/2013 for a dysphagia 3 diet with thin liquids.        SLP Plan  Goals updated     Recommendations  Diet recommendations: Dysphagia 2 (fine chop);Thin liquid Liquids provided via: Cup;No straw Medication Administration: Crushed with puree Supervision: Staff to assist with self feeding Compensations: Slow rate;Small sips/bites Postural Changes and/or Swallow Maneuvers: Seated upright 90 degrees             Oral Care Recommendations: Oral care BID Follow up Recommendations: Other  (comment) (TBD) Plan: Goals updated                    ADAMS,PAT, M.S., CCC-SLP 05/09/2015, 6:30 PM

## 2015-05-09 NOTE — Evaluation (Signed)
Physical Therapy Evaluation Patient Details Name: Manuel Higgins MRN: [id number] DOB: 04-03-1950 Today's Date: 05/09/2015   History of Present Illness  Manuel Higgins is a 65 year old man with a history of ischemic stroke in 2015, hypertension, hyperlipidemia, and type 2 diabetes who presents with the acute onset of somnolence. He was in his usual state of health until the day prior to admission when his brother visited him in the nursing home and found him to be somnolent. He was arousable and then upon waking up was apparently his baseline self. On the day of admission, he was found to be somnolent by a staff member of the nursing home and this time could not be awoken. A blood sugar at that time was 40 and he was given juice with an increase in his sugar level. He was transported to the emergency department and noted to be hypertensive and somnolent. Initial evaluation including a CT scan of the head and MRI of the head were unremarkable for acute changes.    Clinical Impression  Pt presents with moderate to severe limitations to functional mobility related to incr tone, decr joint ROM, decr strength, decr balance ability limiting bed mobility, transitional movements and ambulation; pt currently requires at least minimal assistance to walk short distances.  More alert and awake today, asking to eat.    Recommend return to SNF at d/c. PT will initiate mobility retraining in acute setting and assist with d/c as able.  See below for details of examination and care plan for goals of care.    Follow Up Recommendations SNF;Supervision/Assistance - 24 hour    Equipment Recommendations  Rolling walker with 5" wheels    Recommendations for Other Services       Precautions / Restrictions Precautions Precautions: Fall Precaution Comments: up with assistance only, use bed alarm, RW in room Restrictions Weight Bearing Restrictions: No      Mobility  Bed Mobility Overal bed mobility: Needs  Assistance Bed Mobility: Supine to Sit;Sit to Supine     Supine to sit: Min guard Sit to supine: Min guard   General bed mobility comments: moves to/from EOB unassisted with cues to redirect impulsive desire to stand up; cues to align self in bed, able to perform unassisted  Transfers Overall transfer level: Needs assistance Equipment used: Rolling walker (2 wheeled) Transfers: Sit to/from Stand Sit to Stand: Min assist Stand pivot transfers: Min assist       General transfer comment: hands on assist with occasional facilitation for optimal technique; able to transition to RW, needs assist for initial stability and cues for optimal posture  Ambulation/Gait Ambulation/Gait assistance: Min assist Ambulation Distance (Feet): 75 Feet Assistive device: Rolling walker (2 wheeled) Gait Pattern/deviations: Step-through pattern;Decreased stride length;Shuffle;Trunk flexed;Drifts right/left;Narrow base of support Gait velocity: slow/decr Gait velocity interpretation: Below normal speed for age/gender General Gait Details: tends to drift left, able to correct with cue to use LUE 'more' to remain on straight trajectory, but cannot maintain.  does not present with s/s of fatigue or LE weakness and hands on assist for safety and directional guidance only  Stairs            Wheelchair Mobility    Modified Rankin (Stroke Patients Only)       Balance Overall balance assessment: Needs assistance Sitting-balance support: No upper extremity supported;Feet supported Sitting balance-Leahy Scale: Fair     Standing balance support: Bilateral upper extremity supported;During functional activity Standing balance-Leahy Scale: Poor Standing balance comment: depends on external support for  standing                             Pertinent Vitals/Pain Pain Assessment: No/denies pain    Home Living Family/patient expects to be discharged to:: Skilled nursing facility                       Prior Function           Comments: unknown. Pt. is not accurate historian.      Hand Dominance   Dominant Hand: Right    Extremity/Trunk Assessment   Upper Extremity Assessment: Defer to OT evaluation           Lower Extremity Assessment: Generalized weakness;Overall [facility] LLCWFL for tasks assessed      Cervical / Trunk Assessment: Normal  Communication   Communication: Expressive difficulties (verbal but poor enunciation )  Cognition Arousal/Alertness: Awake/alert Behavior During Therapy: WFL for tasks assessed/performed;Impulsive (occasional impulsivity easily redirected) Overall Cognitive Status: Difficult to assess       Memory: Decreased short-term memory              General Comments General comments (skin integrity, edema, etc.): more awake and participates fully, attempts to communicate, but speech is unclear (asking for steak or chicken, hasn't eaten since y'day, sta[place] lives in [place], [place]    Exercises        Assessment/Plan    PT Assessment Patient needs continued PT services  PT Diagnosis Difficulty walking   PT Problem List Decreased safety awareness;Decreased knowledge of use of DME;Decreased cognition;Decreased coordination;Decreased mobility;Decreased balance;Decreased strength  PT Treatment Interventions Patient/family education;Balance training;Therapeutic exercise;Therapeutic activities;Functional mobility training;Gait training;DME instruction   PT Goals (Current goals can be found in the Care Plan section) Acute Rehab PT Goals Patient Stated Goal:  (go home) PT Goal Formulation: Patient unable to participate in goal setting Time For Goal Achievement: 05/30/15 Potential to Achieve Goals: Fair    Frequency Min 3X/week   Barriers to discharge Other (comment) (if from SNF then no barriers)      Co-evaluation               End of Session Equipment Utilized During  Treatment: Gait belt Activity Tolerance: Patient tolerated treatment well Patient left: in bed;with call bell/phone within reach;with nursing/sitter in room;with bed alarm set Nurse Communication: Mobility status (asking for food; check IV)         Time: [id number] PT Time Calculation (min) (ACUTE ONLY): 29 min   Charges:   PT Evaluation $PT Eval Moderate Complexity: 1 Procedure PT Treatments $Gait Training: 8-22 mins   PT G Codes:        Dennis BastMartin, Kristian Mogg Galloway 05/09/2015, 12:27 PM

## 2015-05-09 NOTE — Progress Notes (Signed)
Patient ID: Manuel Higgins, male   DOB: [birth year], 65 y.o.   MRN: [id number]   Subjective: This morning, his mental status appears markedly improved. He is able to answer questions albeit his speech is slurred. He is requesting to eat a meal.  Objective: Vital signs in last 24 hours: Filed Vitals:   05/08/15 2142 05/09/15 0132 05/09/15 0440 05/09/15 1014  BP: 174/77 169/75 165/80 163/75  Pulse: 82 76 79 76  Temp:    97.9 F (36.6 C)  TempSrc:    Oral  Resp: _0 SpO2: 98% 97% 98% 100%    General: Middle-aged African-American male, resting in bed HEENT: no scleral icterus, oropharynx without lesions Cardiac: regular rate and rhythm without murmurs, rubs, or gallops Pulm: breathing well, clear to auscultation bilaterally Abd: bowel sounds normal, soft, nondistended, non-tender Ext: warm and well perfused, without pedal edema Skin: ischemic ulcers on the distal left first and second fingers with 2+ radial pulses bilaterally and good capillary refill Neuro: alert and oriented X3, eyes tracking horizontally from left to right once his eyelids are forcefully opened; this is not nystagmus, 5/5 upper and lower extremity strength bilaterally though noted rigidity persistent from yesterday  Lab Results: Basic Metabolic Panel:  Recent Labs Lab 05/08/15 0236 05/09/15 0552  NA 139 139  K 3.7 4.3  CL 104 104  CO2 26 24  GLUCOSE 159* 159*  BUN 14 11  CREATININE 0.99 0.99  CALCIUM 8.6* 9.1   Liver Function Tests:  Recent Labs Lab 05/07/15 0920  AST 23  ALT 14*  ALKPHOS 77  BILITOT 0.3  PROT 6.7  ALBUMIN 3.2*   CBC:  Recent Labs Lab 05/07/15 0920  05/08/15 0236 05/09/15 0552  WBC 9.9  --  8.6 5.3  NEUTROABS 8.4*  --   --   --   HGB 10.4*  < > 9.1* 10.0*  HCT 32.4*  < > 28.4* 30.2*  MCV 96.4  --  96.9 94.1  PLT 241  --  222 252  < > = values in this interval not displayed. Urine Drug Screen: Drugs of Abuse     Component Value Date/Time   LABOPIA NONE  DETECTED 05/07/2015 0001   COCAINSCRNUR NONE DETECTED 05/07/2015 0001   LABBENZ NONE DETECTED 05/07/2015 0001   AMPHETMU NONE DETECTED 05/07/2015 0001   THCU NONE DETECTED 05/07/2015 0001   LABBARB NONE DETECTED 05/07/2015 0001    Urinalysis:  Recent Labs Lab 05/07/15 1000  COLORURINE YELLOW  LABSPEC 1.016  PHURINE 6.0  GLUCOSEU NEGATIVE  HGBUR NEGATIVE  BILIRUBINUR NEGATIVE  KETONESUR NEGATIVE  PROTEINUR NEGATIVE  NITRITE NEGATIVE  LEUKOCYTESUR NEGATIVE   Micro Results: Recent Results (from the past 240 hour(s))  Culture, blood (routine x 2)     Status: None (Preliminary result)   Collection Time: 05/07/15  3:31 PM  Result Value Ref Range Status   Specimen Description BLOOD RIGHT HAND  Final   Special Requests BOTTLES DRAWN AEROBIC ONLY 5CC  Final   Culture NO GROWTH < 24 HOURS  Final   Report Status PENDING  Incomplete  Culture, blood (routine x 2)     Status: None (Preliminary result)   Collection Time: 05/07/15  3:45 PM  Result Value Ref Range Status   Specimen Description BLOOD LEFT HAND  Final   Special Requests BOTTLES DRAWN AEROBIC AND ANAEROBIC 5CC  Final   Culture NO GROWTH < 24 HOURS  Final   Report Status PENDING  Incomplete  MRSA PCR Screening  Status: Abnormal   Collection Time: 05/08/15  3:19 AM  Result Value Ref Range Status   MRSA by PCR POSITIVE (A) NEGATIVE Final    Comment:        The GeneXpert MRSA Assay (FDA approved for NASAL specimens only), is one component of a comprehensive MRSA colonization surveillance program. It is not intended to diagnose MRSA infection nor to guide or monitor treatment for MRSA infections. RESULT CALLED TO, READ BACK BY AND VERIFIED WITH: S WHALEY,RN _0  05/08/15 MKELLY    Studies/Results: Manuel Higgins Contrast  05/07/2015  CLINICAL DATA:  65 year old male with previous infarct in 2015. Acute onset decreased mental status and confusion. Initial encounter. EXAM: MRI HEAD WITHOUT CONTRAST TECHNIQUE:  Multiplanar, multiecho pulse sequences of the brain and surrounding structures were obtained without intravenous contrast. COMPARISON:  Head CT without contrast 1044 hours today and earlier. FINDINGS: Major intracranial vascular flow voids are stable. No restricted diffusion or evidence of acute infarction. Extensive chronic small vessel infarcts in the bilateral cerebellar hemispheres, brainstem, thalami, and bilateral corona radiata. Basal ganglia less affected. Minimal associated chronic micro hemorrhages. Since 2015, there has been no significant change. No midline shift, mass effect, evidence of mass lesion, ventriculomegaly, extra-axial collection or acute intracranial hemorrhage. Cervicomedullary junction and pituitary are within normal limits. Stable visualized cervical spine. Visible internal auditory structures appear normal. Stable trace mastoid fluid. Mild to moderate paranasal sinus mucosal thickening is stable. Trace retained secretions in the nasopharynx. Negative orbit and scalp soft tissues. Visualized bone marrow signal is within normal limits. IMPRESSION: 1.  No acute intracranial abnormality. 2. Advanced chronic small vessel ischemic disease appears stable since 2015. Electronically Signed   By: Genevie Ann M.D.   On: 05/07/2015 17:16   Medications: I have reviewed the patient's current medications. Scheduled Meds: . baclofen  10 mg Oral QHS  . enoxaparin (LOVENOX) injection  40 mg Subcutaneous Q24H  . insulin aspart  0-15 Units Subcutaneous Q4H  . sodium chloride flush  3 mL Intravenous Q12H   Continuous Infusions: . dextrose 5 % and 0.45 % NaCl with KCl 20 mEq/L 125 mL/hr at 05/09/15 0847   PRN Meds:.hydrALAZINE   Assessment/Plan:  Manuel. Harari is a 65 year old man with an ischemic stroke in March 2015 hospitalized for acute encephalopathy found to have necrotic ulcers of his fingertips now with improving encephalopathy today.  Acute encephalopathy: Suspect toxic etiology related to  adverse effect of one of his medications given the marked improvement in his mental status with just supportive care over the last 24 hours. Per review of the records provided to [place] from his facility which are available in the paper chart, it appears he was taking Depakote 125 mg twice daily through the morning of 4/18 along with Risperdal 0.25 mg twice daily through the evening of 4/18. Last physician note included in the paper chart is from Dr. Reymundo Poll dated 01/06/15 which does not comment on the indications for these medications. He does remain rigid on exam though is able to follow commands which is reassuring today. 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 Patient does not report a prior history of seizures or psychiatric disease which is further puzzling. ESR, CRP, TSH, RPR, CK, HIV, HCV reassuring. -Follow-up EEG, -Holding risperidone and Depakote -Consult SLP to assess his swallow function -Follow-up PT/OT -Neurology following, appreciate recommendations  Distal fingertip ulcers: Per above, this may represent Buerger's disease given his smoking history. I doubt this is peripheral vascular  disease as he has good radial pulses and capillary refill. Other considerations are cryglobulinemia related to an autoimmune condition, although he does not have other findings suggestive of the former nor elevated inflammatory markers. HIV/HCV negative.  History of ischemic stroke: Per above, recurrent stroke remains on the differential and will be sorted out with MRI. -Will change atorvastatin 10m to 840mdaily upon discharge -Will change aspirin 32552maily to 55m18mily once he can swallow  Hypertension: Blood pressures trending 160s to 180s/70s to 80s off his home medications: lisinopril 10mg27mly, hydralazine 50mg 53me times daily, amlodipine 10mg d38m. -Resume hydralazine now and consider resuming lisinopril once he is tolerating oral intake  Insulin-dependent  type 2 diabetes: [place] A1c 7.6 in 09/28/14. He is on detemir 45U nightly and 10U in the morning with sliding scale. -Sensitive sliding scale with HS coverage for now -Holding basal insulin for now given hypoglycemia and not eating oral intake yet -Checking A1c  Dispo: Disposition is deferred at this time, awaiting improvement of current medical problems.  Anticipated discharge in approximately 1-3 day(s).   The patient does have a current PCP ([name].  The patient does have transportation limitations that hinder transportation to clinic appointments.  .Services Needed at time of discharge: Y = Yes, Blank = No PT:   OT:   RN:   Equipment:   Other:     LOS: 2 days   Rushil Riccardo Dubin23/2017, 11:48 AM

## 2015-05-09 NOTE — Progress Notes (Signed)
Patient is lying in bed this morning, sleeping, no signs/symptoms of pain or discomfort observed. Will continue to monitor.

## 2015-05-10 ENCOUNTER — Inpatient Hospital Stay (HOSPITAL_COMMUNITY): Payer: Medicaid Other

## 2015-05-10 DIAGNOSIS — G2111 Neuroleptic induced parkinsonism: Secondary | ICD-10-CM

## 2015-05-10 DIAGNOSIS — T43595A Adverse effect of other antipsychotics and neuroleptics, initial encounter: Secondary | ICD-10-CM

## 2015-05-10 DIAGNOSIS — G9340 Encephalopathy, unspecified: Secondary | ICD-10-CM

## 2015-05-10 LAB — CBC
HEMATOCRIT: 29.7 % — AB (ref 39.0–52.0)
Hemoglobin: 9.5 g/dL — ABNORMAL LOW (ref 13.0–17.0)
MCH: 30.5 pg (ref 26.0–34.0)
MCHC: 32 g/dL (ref 30.0–36.0)
MCV: 95.5 fL (ref 78.0–100.0)
PLATELETS: 240 10*3/uL (ref 150–400)
RBC: 3.11 MIL/uL — ABNORMAL LOW (ref 4.22–5.81)
RDW: 12.6 % (ref 11.5–15.5)
WBC: 5.7 10*3/uL (ref 4.0–10.5)

## 2015-05-10 LAB — BASIC METABOLIC PANEL
Anion gap: 12 (ref 5–15)
BUN: 10 mg/dL (ref 6–20)
CALCIUM: 8.7 mg/dL — AB (ref 8.9–10.3)
CHLORIDE: 105 mmol/L (ref 101–111)
CO2: 22 mmol/L (ref 22–32)
Creatinine, Ser: 1.1 mg/dL (ref 0.61–1.24)
GFR calc non Af Amer: >60 mL/min (ref >60)
GLUCOSE: 124 mg/dL — AB (ref 65–99)
Potassium: 4.1 mmol/L (ref 3.5–5.1)
Sodium: 139 mmol/L (ref 135–145)

## 2015-05-10 LAB — GLUCOSE, CAPILLARY
GLUCOSE-CAPILLARY: 170 mg/dL — AB (ref 65–99)
Glucose-Capillary: 124 mg/dL — ABNORMAL HIGH (ref 65–99)
Glucose-Capillary: 114 mg/dL — ABNORMAL HIGH (ref 65–99)
Glucose-Capillary: 98 mg/dL (ref 65–99)

## 2015-05-10 LAB — HEMOGLOBIN A1C
Hgb A1c MFr Bld: 7.2 % — ABNORMAL HIGH (ref 4.8–5.6)
Mean Plasma Glucose: 160 mg/dL

## 2015-05-10 MED ORDER — ATORVASTATIN CALCIUM 80 MG PO TABS
80.0000 mg | ORAL_TABLET | Freq: Every day | ORAL | Status: DC
  Administered 2015-05-10: 80 mg via ORAL
  Filled 2015-05-10: qty 1

## 2015-05-10 MED ORDER — INSULIN DETEMIR 100 UNIT/ML ~~LOC~~ SOLN: 10.0000 [IU] | Freq: Every day | SUBCUTANEOUS | Status: DC

## 2015-05-10 MED ORDER — ASPIRIN 81 MG PO CHEW
81.0000 mg | CHEWABLE_TABLET | Freq: Every day | ORAL | Status: DC
  Administered 2015-05-10: 81 mg via ORAL
  Filled 2015-05-10: qty 1

## 2015-05-10 MED ORDER — LISINOPRIL 10 MG PO TABS
10.0000 mg | ORAL_TABLET | Freq: Every day | ORAL | Status: DC
  Administered 2015-05-10: 10 mg via ORAL
  Filled 2015-05-10: qty 1

## 2015-05-10 MED ORDER — ASPIRIN 81 MG PO CHEW: 81.0000 mg | CHEWABLE_TABLET | Freq: Every day | ORAL | Status: AC

## 2015-05-10 MED ORDER — ATORVASTATIN CALCIUM 80 MG PO TABS: 80.0000 mg | ORAL_TABLET | Freq: Every day | ORAL | Status: AC

## 2015-05-10 MED ORDER — BACLOFEN 10 MG PO TABS: 10.0000 mg | ORAL_TABLET | Freq: Every day | ORAL | Status: AC

## 2015-05-10 MED ORDER — METFORMIN HCL 1000 MG PO TABS: ORAL_TABLET | ORAL | Status: DC

## 2015-05-10 NOTE — Procedures (Signed)
HPI:  65 y/o with mental status change   TECHNICAL SUMMARY:  A multichannel referential and bipolar montage EEG using the standard international 10-20 system was performed on the patient described as drowsy.  During the very brief portion of the recording in which the patient is most awake there is a 9 Hz activity seen most prominantly over the posterior head region.  The backgound activity is nonreactive to eye opening and closing procedures.  Low voltage fast (beta) activity is distributed symmetrically and maximally over the anterior head regions.  ACTIVATION:  Stepwise photic stimulation and hyperventilation were not performed.  EPILEPTIFORM ACTIVITY:  There were no spikes, sharp waves or paroxysmal activity.  SLEEP:  Most of the recording is spent in stage I and stage II sleep architecture.  CARDIAC:  The EKG lead revealed a regular sinus rhythm.  IMPRESSION:  This is a normal drowsy and asleep EEG for the patients stated age.  There is very little waking rhythm, but when most awake, there is a normal background.  There were no focal, hemispheric or lateralizing features.  No epileptiform activity was recorded.  A normal EEG does not exclude the diagnosis of a seizure disorder and if seizure remains high on the list of differential diagnosis, an ambulatory EEG may be of value.  Clinical correlation is required.

## 2015-05-10 NOTE — NC FL2 (Signed)
  [place] MEDICAID FL2 LEVEL OF CARE SCREENING TOOL     IDENTIFICATION  Patient Name: Manuel Higgins Birthdate: 07/16/1950 Sex: male Admission Date (Current Location): 05/07/2015  [facility] [place]   Facility and Address:  [facility], [address], [place], [place] [address]      Provider Number: [id number]  Attending Physician Name and Address:  Earl LagosNischal Narendra, MD  Relative Name and Phone Number:       Current Level of Care: Hospital Recommended Level of Care: Assisted Living Facility Prior Approval Number:    Date Approved/Denied:   PASRR Number: [id number]  Discharge Plan: Other (Comment) (ALF)    Current Diagnoses: Patient Active Problem List   Diagnosis Date Noted  . Altered mental status 05/07/2015  . History of embolic stroke 05/07/2015  . History of CVA with residual deficit 05/07/2015  . Acute encephalopathy 05/07/2015  . Essential hypertension 04/24/2014  . HLD (hyperlipidemia) 04/24/2014  . Dysphagia 04/04/2013  . Diabetes (HCC) 02/16/2013  . Callus of foot 02/16/2013    Orientation RESPIRATION BLADDER Height & Weight     Self, Time, Situation, Place  Normal Incontinent Weight:   Height:     BEHAVIORAL SYMPTOMS/MOOD NEUROLOGICAL BOWEL NUTRITION STATUS      Continent Diet (Please see discharge summary.)  AMBULATORY STATUS COMMUNICATION OF NEEDS Skin   Limited Assist Verbally Normal                       Personal Care Assistance Level of Assistance  Bathing, Feeding, Dressing Bathing Assistance: Limited assistance Feeding assistance: Limited assistance Dressing Assistance: Limited assistance     Functional Limitations Info             SPECIAL CARE FACTORS FREQUENCY  PT (By licensed PT), OT (By licensed OT)     PT Frequency: 5 OT Frequency: 5            Contractures      Additional Factors Info  Code Status, Allergies, Isolation Precautions Code Status Info: FULL Allergies  Info: No known allergies     Isolation Precautions Info: MRSA     Current Medications (05/10/2015):  This is the current hospital active medication list Current Facility-Administered Medications  Medication Dose Route Frequency Provider Last Rate Last Dose  . aspirin chewable tablet 81 mg  81 mg Oral Daily Selina CooleyKyle Flores, MD   81 mg at 05/10/15 1216  . atorvastatin (LIPITOR) tablet 80 mg  80 mg Oral q1800 Selina CooleyKyle Flores, MD      . baclofen (LIORESAL) tablet 10 mg  10 mg Oral QHS Ram Daniel NonesNarayan Kaveer Nandigam, MD   10 mg at 05/09/15 2229  . enoxaparin (LOVENOX) injection 40 mg  40 mg Subcutaneous Q24H Alexa Lucrezia Starch Burns, MD   40 mg at 05/09/15 2229  . insulin aspart (novoLOG) injection 0-15 Units  0-15 Units Subcutaneous Q4H Servando SnareAlexa R Burns, MD   3 Units at 05/10/15 1215  . lisinopril (PRINIVIL,ZESTRIL) tablet 10 mg  10 mg Oral Daily Selina CooleyKyle Flores, MD   10 mg at 05/10/15 1215  . sodium chloride flush (NS) 0.9 % injection 3 mL  3 mL Intravenous Q12H Alexa Lucrezia Starch Burns, MD   3 mL at 05/08/15 2115     Discharge Medications: Please see discharge summary for a list of discharge medications.  Relevant Imaging Results:  Relevant Lab Results:   Additional Information SSN: [id number]  Rod MaeVaughn, Doral Digangi S, LCSW

## 2015-05-10 NOTE — Progress Notes (Signed)
Patient ID: Manuel Higgins, male   DOB: 05-Jun-1950, 65 y.o.   MRN: [id number]   Subjective: Manuel Higgins is much more alert; he is oriented to self and place but thinks it is 65. He has no complaints; he denies any pain or difficulty moving, and feels he is back to his normal self.  Objective: Vital signs in last 24 hours: Filed Vitals:   05/09/15 2101 05/10/15 0116 05/10/15 0510 05/10/15 0907  BP: 172/74 143/65 159/69 111/65  Pulse: 92 89 79 86  Temp: 97.7 F (36.5 C) 98.2 F (36.8 C) 98.1 F (36.7 C) 97.9 F (36.6 C)  TempSrc: Oral Oral Oral Axillary  Resp: SpO2: 100% 98% 98% 98%   Weight change:   Intake/Output Summary (Last 24 hours) at 05/10/15 1144 Last data filed at 05/10/15 0908  Gross per 24 hour  Intake    240 ml  Output   1290 ml  Net  -1050 ml   General: resting in bed comfortably, speaking slowly with a masked facies HEENT: no scleral icterus, extra-ocular muscles intact, oropharynx without lesions Cardiac: regular rate and rhythm, no rubs, murmurs or gallops Pulm: breathing well, clear to auscultation bilaterally Abd: bowel sounds normal, soft, nondistended, non-tender Ext: warm and well perfused, without pedal edema Lymph: no cervical or supraclavicular lymphadenopathy Skin: necrotic ulcerations on rights first digit and left second and third digits; there are no sclerodermoid signs such as morphea or dilated capillary loops in the [place]l Neuro: alert and oriented X3, cranial nerves II-XII grossly intact, glabellar sign positive, quesitonable cogwheeling rigidity due to patient's misudnerstanding of exam, no pill-rolling tremor  Lab Results: Basic Metabolic Panel:  Recent Labs Lab 05/09/15 0552 05/10/15 0357  NA 139 139  K 4.3 4.1  CL 104 105  CO2 24 22  GLUCOSE 159* 124*  BUN 11 10  CREATININE 0.99 1.10  CALCIUM 9.1 8.7*   CBC:  Recent Labs Lab 05/07/15 0920  05/09/15 0552 05/10/15 0357  WBC 9.9  < > 5.3 5.7  NEUTROABS 8.4*  --    --   --   HGB 10.4*  < > 10.0* 9.5*  HCT 32.4*  < > 30.2* 29.7*  MCV 96.4  < > 94.1 95.5  PLT 241  < > 252 240  < > = values in this interval not displayed.  CBG:  Recent Labs Lab 05/09/15 1125 05/09/15 1657 05/09/15 2043 05/09/15 2339 05/10/15 0400 05/10/15 0814  GLUCAP 145* 157* 171* 134* 124* 114*   Hemoglobin A1C:  Recent Labs Lab 05/07/15 2020  HGBA1C 7.2*   Thyroid Function Tests:  Recent Labs Lab 05/08/15 1453  TSH 1.008   Medications: I have reviewed the patient's current medications. Scheduled Meds: . aspirin  81 mg Oral Daily  . atorvastatin  80 mg Oral q1800  . baclofen  10 mg Oral QHS  . enoxaparin (LOVENOX) injection  40 mg Subcutaneous Q24H  . insulin aspart  0-15 Units Subcutaneous Q4H  . lisinopril  10 mg Oral Daily  . sodium chloride flush  3 mL Intravenous Q12H   Continuous Infusions: . dextrose 5 % and 0.45 % NaCl with KCl 20 mEq/L 125 mL/hr at 05/10/15 0200   PRN Meds:.  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 We will continue to hold his risperidol and he can follow up with Neurology on an outpatient basis for further evaluation. We will touch base with his family to ensure he is at his baseline; if he is, he can go back to SNF today. If not, we will monitor him another night. -Holding risperidone upon discharge -Will follow-up with Neurology upon discharge to further evaluate for Parkinsonism -Resume baclofen 10mg  daily per Neurology's recommendations  Distal fingertip necrotic ulcerations: I suspect this is Buerger's disease  given his history of smoking. I doubt peripheral vascular disease, infectious endocarditis, cryglobulinemia, Raynaud's disease, or progressive systemic sclerosis. -Counseled on importance of smoking cessation  History of ischemic stroke: Recurrent stroke ruled out with brain MRI. -Increased atorvastatin to 80mg  daily -Changed aspirin 325mg  daily to 81mg  daily -Managing hypertension and diabetes per below.  Hypertension: Pressures a little high; will resume home medications now that he can swallow. -Resume lisinopril 10mg  daily -Holding home amlodipine 10mg  daily  Type 2 diabetes: His a1c was 7.2 this admission. He came in hypoglycemic and his sugars have been in the low 100s so I will hold it upon discharge and start him on metformin. -Holding insulin -Started metformin 500mg  twice daily  Dispo: Disposition is deferred at this time, awaiting improvement of current medical problems.  Anticipated discharge in approximately 0-1 day(s).   The patient does have a current PCP Manuel Limbo([name]) and [facility].  The patient does have transportation limitations that hinder transportation to clinic appointments.  .Services Needed at time of discharge: Y = Yes, Blank = No PT:   OT:   RN:   Equipment:   Other:     LOS: 3 days   Selina CooleyKyle Peirce Deveney, MD 05/10/2015, 11:44 AM

## 2015-05-10 NOTE — Discharge Summary (Signed)
Name: Manuel Higgins MRN: [id number] DOB: [birth year] 65 y.o. PCP: Bernerd Limbo  Date of Admission: 05/07/2015  9:04 AM Date of Discharge: 05/10/2015 Attending Physician: Earl Lagos, MD  Discharge Diagnosis: 1. Drug-induced Parkinsonism 2. Necrotic ulcerations on distal fingertips 3. History of ischemic stroke 4. Hypertension 5. Type 2 diabetes  Discharge Medications:    Medication List    STOP taking these medications        amLODipine 10 MG tablet  Commonly known as:  NORVASC     aspirin 325 MG tablet  Replaced by:  aspirin 81 MG chewable tablet     DEPAKOTE 125 MG DR tablet  Generic drug:  divalproex     hydrALAZINE 50 MG tablet  Commonly known as:  APRESOLINE     insulin aspart 100 UNIT/ML injection  Commonly known as:  novoLOG     LORazepam 0.5 MG tablet  Commonly known as:  ATIVAN     risperiDONE 0.25 MG tablet  Commonly known as:  RISPERDAL     Vitamin D (Ergocalciferol) 50000 units Caps capsule  Commonly known as:  DRISDOL      TAKE these medications        aspirin 81 MG chewable tablet  Chew 1 tablet (81 mg total) by mouth daily.     atorvastatin 80 MG tablet  Commonly known as:  LIPITOR  Take 1 tablet (80 mg total) by mouth daily at 6 PM.     baclofen 10 MG tablet  Commonly known as:  LIORESAL  Take 1 tablet (10 mg total) by mouth at bedtime.     insulin detemir 100 UNIT/ML injection  Commonly known as:  LEVEMIR  Inject 0.1 mLs (10 Units total) into the skin at bedtime. And inject 45 units into the skin at bedtime     lisinopril 10 MG tablet  Commonly known as:  PRINIVIL,ZESTRIL  Take 1 tablet (10 mg total) by mouth daily.     metFORMIN 1000 MG tablet  Commonly known as:  GLUCOPHAGE  Take 1/2 pill twice daily for 1 week, then 1 pill twice daily therafter     naphazoline 0.1 % ophthalmic solution  Commonly known as:  NAPHCON  Place 1 drop into both eyes 4 (four) times daily as needed for irritation.     Vitamin D 2000 units Caps   Take 2,000 Units by mouth daily.        Disposition and follow-up:   Mr.Manuel Higgins was discharged from [facility] in Good condition.  At the hospital follow up visit please address:  Glycemic control: A1c 7.2, reassess need for insulin therapy  Follow-up with Neurology for Parkinsonism  Follow-up Appointments:     Follow-up Information    Follow up with Bernerd Limbo. Call in 1 week.   Specialty:  Family Medicine   Why:  Hospital follow-up   Contact information:   [address] [place] [address] [phone]      Discharge Instructions: Discharge Instructions    Call MD for:  hives    Complete by:  As directed      Call MD for:  persistant dizziness or light-headedness    Complete by:  As directed      Call MD for:  temperature >100.4    Complete by:  As directed      Diet - low sodium heart healthy    Complete by:  As directed      Increase activity slowly  Complete by:  As directed            Consultations:  Neurology  Procedures Performed:  Ct Head Wo Contrast  05/07/2015  CLINICAL DATA:  Unresponsive and hypoglycemia. EXAM: CT HEAD WITHOUT CONTRAST TECHNIQUE: Contiguous axial images were obtained from the base of the skull through the vertex without intravenous contrast. COMPARISON:  04/19/2013 FINDINGS: Stable moderately advanced small vessel disease and bilateral deep old white matter lacunar infarcts. The brain demonstrates no evidence of hemorrhage, infarction, edema, mass effect, extra-axial fluid collection, hydrocephalus or mass lesion. The skull is unremarkable. IMPRESSION: Stable advanced small vessel disease and deep white matter lacunar infarcts. No acute findings by head CT. Electronically Signed   By: Irish Lack M.D.   On: 05/07/2015 10:52   Mr Brain Wo Contrast  05/07/2015  CLINICAL DATA:  65 year old male with previous infarct in 2015. Acute onset decreased mental status and confusion. Initial encounter. EXAM:  MRI HEAD WITHOUT CONTRAST TECHNIQUE: Multiplanar, multiecho pulse sequences of the brain and surrounding structures were obtained without intravenous contrast. COMPARISON:  Head CT without contrast 1044 hours today and earlier. FINDINGS: Major intracranial vascular flow voids are stable. No restricted diffusion or evidence of acute infarction. Extensive chronic small vessel infarcts in the bilateral cerebellar hemispheres, brainstem, thalami, and bilateral corona radiata. Basal ganglia less affected. Minimal associated chronic micro hemorrhages. Since 2015, there has been no significant change. No midline shift, mass effect, evidence of mass lesion, ventriculomegaly, extra-axial collection or acute intracranial hemorrhage. Cervicomedullary junction and pituitary are within normal limits. Stable visualized cervical spine. Visible internal auditory structures appear normal. Stable trace mastoid fluid. Mild to moderate paranasal sinus mucosal thickening is stable. Trace retained secretions in the nasopharynx. Negative orbit and scalp soft tissues. Visualized bone marrow signal is within normal limits. IMPRESSION: 1.  No acute intracranial abnormality. 2. Advanced chronic small vessel ischemic disease appears stable since 2015. Electronically Signed   By: Odessa Fleming M.D.   On: 05/07/2015 17:16   Dg Chest Port 1 View  05/07/2015  CLINICAL DATA:  65 year old male with a history of altered mental status EXAM: PORTABLE CHEST 1 VIEW COMPARISON:  04/18/2013, 04/04/2013 FINDINGS: Cardiomediastinal silhouette unchanged in size and contour. No evidence of pulmonary vascular congestion.  Nine Cardiac event recorder again projects over the left mediastinum. No confluent airspace disease or pneumothorax.  No pleural effusion. No displaced fracture. IMPRESSION: No radiographic evidence of acute cardiopulmonary disease. Signed, Yvone Neu. Loreta Ave, DO [facility] Electronically  Signed   By: Gilmer Mor D.O.   On: 05/07/2015 09:37   Admission HPI:   [name] is a 65 year old man with ischemic stroke back in March 2015 on aspirin 325mg  daily, insulin-dependent type 2 diabetes, hypertension, and hyperlipidemia presenting with acute-onset somnolence and confusion.  Per the family, until today, Mr. Benak was independent of all activities of daily living, living in a nursing home because he is unable to manage his medication regimen alone, where he was perfectly normal and would carry on conversations with his family. His brother went to visit him yesterday; he was sleeping and difficult to arouse, but when he woke up, he was his normal self. During that visit, he noticed black ulcers on his fingertips that had reportedly been there for the last week. This morning, a staff member found him unresponsive; he had a capillary glucose of 40. He was given juice; his sugars subsequently improved, but he did not awaken any more. In  the room, we found a medication list from his SNF; he had been getting all of his medications except for divalproex for the last 2 days. It's unclear why he's on this medication as we saw no history of seizures in his chart. When we saw him, he was able to say yes and no and was able to say he was not in any pain. However he did not know his name nor where he was located. We were unable to obtain any further history nor review of systems due to the patient's somnolence and confusion.  Hospital Course by problem list:   1. Drug-induced Parkinsonism: He presented with somnolence and was found to be rigid on exam. After holding his risperidone, he became much more alert and was able to move his extremities, but he did have masked facies, cogwheel rigidity, and a positive glabellar sign. Neuroleptic malignant syndrome and serotonin syndromes were deemed less likely given he was afebrile and did not have myoclonus, and brain CT and MRI ruled out hemorrhage or stroke.  EEG was reassuring for no epileptiform activity. Speech therapy recommended dysphagia 2 diet though could advance to dysphagia 3 diet if his mental status remained stable. He was instructed to stop taking all neuroleptic medications [risperidone, lorazepam, depakote] upon discharge back to his skilled nursing facility. He should follow-up with the physician who prescribes this medication to decide the need for this treatment regimen. Baclofen was started given his muscle spasticity.  2. Necrotic ulcerations on distal fingertips: We felt this was most likely Buerger's disease related to his smoking. He has good radial pulses and capillary refill so I doubted peripheral vascular disease. He did not appear sick nor have a murmur suggestive of infectious endocarditis, he was not febrile and does not have hepatitis C suggestive of cryoglobulinemia, and he did not have sclerodermoid skin changes suggestive of progressive systemic sclerosis.  3. History of ischemic stroke: Recurrent stroke was ruled out with unchanged brain MRI from 2015. Upon discharge, his aspirin was changed from  to  daily and his atorvastatin was changed from  to  daily  4. Hypertension: His pressures were well-controlled in the 120s/60s on lisinopril  alone, so amlodipine  daily was discontinued.  5. Type 2 diabetes: A1c 7.2. He was noted to be hypoglycemic in the 40s prior to arrival. His insulin was held this admission and his sugars were in the low 100s. His insulin was held upon discharge and he was discharged on metformin  twice daily along with Levemir 10 units at bedtime. Please reassess his need for insulin therapy as an outpatient.  Discharge Vitals:   BP 111/65 mmHg  Pulse 86  Temp(Src) 97.9 F (36.6 C) (Axillary)  Resp 20  SpO2 98%  Discharge Labs:  Results for orders placed or performed during the hospital encounter of 05/07/15 (from the past 24 hour(s))  Glucose, capillary     Status:  Abnormal   Collection Time: 05/09/15  4:57 PM  Result Value Ref Range   Glucose-Capillary 157 (H) 65 - 99 mg/dL   Comment 1 Notify RN    Comment 2 Document in Chart   Glucose, capillary     Status: Abnormal   Collection Time: 05/09/15  8:43 PM  Result Value Ref Range   Glucose-Capillary 171 (H) 65 - 99 mg/dL  Glucose, capillary     Status: Abnormal   Collection Time: 05/09/15 11:39 PM  Result Value Ref Range   Glucose-Capillary 134 (H) 65 - 99 mg/dL  Comment 1 Notify RN    Comment 2 Document in Chart   CBC     Status: Abnormal   Collection Time: 05/10/15  3:57 AM  Result Value Ref Range   WBC 5.7 4.0 - 10.5 K/uL   RBC 3.11 (L) 4.22 - 5.81 MIL/uL   Hemoglobin 9.5 (L) 13.0 - 17.0 g/dL   HCT 36.629.7 (L) 44.039.0 - 34.752.0 %   MCV 95.5 78.0 - 100.0 fL   MCH 30.5 26.0 - 34.0 pg   MCHC 32.0 30.0 - 36.0 g/dL   RDW 42.512.6 95.611.5 - 38.715.5 %   Platelets 240 150 - 400 K/uL  Basic metabolic panel     Status: Abnormal   Collection Time: 05/10/15  3:57 AM  Result Value Ref Range   Sodium 139 135 - 145 mmol/L   Potassium 4.1 3.5 - 5.1 mmol/L   Chloride 105 101 - 111 mmol/L   CO2 22 22 - 32 mmol/L   Glucose, Bld 124 (H) 65 - 99 mg/dL   BUN 10 6 - 20 mg/dL   Creatinine, Ser 5.641.10 0.61 - 1.24 mg/dL   Calcium 8.7 (L) 8.9 - 10.3 mg/dL   GFR calc non Af Amer >60 >60 mL/min   GFR calc Af Amer >60 >60 mL/min   Anion gap 12 5 - 15  Glucose, capillary     Status: Abnormal   Collection Time: 05/10/15  4:00 AM  Result Value Ref Range   Glucose-Capillary 124 (H) 65 - 99 mg/dL   Comment 1 Notify RN    Comment 2 Document in Chart   Glucose, capillary     Status: Abnormal   Collection Time: 05/10/15  8:14 AM  Result Value Ref Range   Glucose-Capillary 114 (H) 65 - 99 mg/dL   Comment 1 Notify RN    Comment 2 Document in Chart   Glucose, capillary     Status: Abnormal   Collection Time: 05/10/15 11:51 AM  Result Value Ref Range   Glucose-Capillary 170 (H) 65 - 99 mg/dL    Signed: Selina CooleyKyle Flores,  MD 05/10/2015, 12:15 PM

## 2015-05-10 NOTE — Progress Notes (Signed)
Discharge orders received. Pt notified and verbalized understanding. IV and tele removed. Report called to supervisor at [facility]. Pt given bath. Pt dressed and belongings packed. Awaiting [facility] to pick up patient for transportation.

## 2015-05-10 NOTE — Care Management Note (Signed)
Case Management Note  Patient Details  Name: Manuel Higgins MRN: [id number] Date of Birth: 09/03/1950  Subjective/Objective:                    Action/Plan: [facility] today. No further needs per CM.   Expected Discharge Date:                  Expected Discharge Plan:  Assisted Living / Rest Home  In-House Referral:     Discharge planning Services     Post Acute Care Choice:    Choice offered to:     DME Arranged:    DME Agency:     HH Arranged:    HH Agency:     Status of Service:  Completed, signed off  Medicare Important Message Given:    Date Medicare IM Given:    Medicare IM give by:    Date Additional Medicare IM Given:    Additional Medicare Important Message give by:     If discussed at Long Length of Stay Meetings, dates discussed:    Additional Comments:  Kermit BaloKelli F Latanza Pfefferkorn, RN 05/10/2015, 2:47 PM

## 2015-05-10 NOTE — Progress Notes (Signed)
Interval History:                                                                                                                      Manuel Higgins is an 65 y.o. male patient admitted with  AMS. Improved mental status, still NPO, failed bedside swallow eval.  Baclofen ordered, not given last night due to NPO.  More alert, follow commands better today.  Denies any new neuro symptoms, or pain.    Past Medical History: Past Medical History  Diagnosis Date  . Hypertension   . Diabetes mellitus without complication (HCC)   . Stroke [facility]) 03/2013    Multiple infarcts  . Hypertensive crisis 02/15/2013  . Toe ulcer (HCC)   . Anemia   . Dysphagia   . Hyperlipidemia   . Dementia     unspecified w/o behavioral disturbance    Past Surgical History  Procedure Laterality Date  . Back surgery    . Tonsillectomy    . Tee without cardioversion N/A 04/07/2013    Procedure: TRANSESOPHAGEAL ECHOCARDIOGRAM (TEE);  Surgeon: Lars Masson, MD;  Location: [facility] ENDOSCOPY;  Service: Cardiovascular;  Laterality: N/A;  . Loop recorder implant  04/08/2013    MDT LinQ implanted by Dr Ladona Ridgel for cryptogenic stroke  . Loop recorder implant N/A 04/08/2013    Procedure: LOOP RECORDER IMPLANT;  Surgeon: Marinus Maw, MD;  Location: [facility] CATH LAB;  Service: Cardiovascular;  Laterality: N/A;    Family History: Family History  Problem Relation Age of Onset  . Family history unknown: Yes    Social History:   reports that he quit smoking about 11 years ago. His smoking use included Cigarettes. He has a 3.3 pack-year smoking history. He has never used smokeless tobacco. He reports that he does not drink alcohol or use illicit drugs.  Allergies:  No Known Allergies   Medications:                                                                                                                         Current facility-administered medications:  .  baclofen (LIORESAL) tablet 10 mg, 10 mg, Oral, QHS, Anselma Herbel Daniel Nones, MD, 10 mg at 05/09/15 2229 .  dextrose 5 % and 0.45 % NaCl with KCl 20 mEq/L infusion, , Intravenous, Continuous, Selina Cooley, MD, Last Rate: 125 mL/hr at 05/10/15 0200 .  enoxaparin (LOVENOX) injection 40 mg, 40 mg, Subcutaneous, Q24H, Alexa R Burns, MD, 40 mg at 05/09/15  2229 .  hydrALAZINE (APRESOLINE) injection 5 mg, 5 mg, Intravenous, Q4H PRN, Rushil Patel V, MD .  insulin aspart (novoLOG) injection 0-15 Units, 0-15 Units, Subcutaneous, Q4H, Alexa Lucrezia Starch Burns, MD, 2 Units at 05/10/15 0407 .  sodium chloride flush (NS) 0.9 % injection 3 mL, 3 mL, Intravenous, Q12H, Alexa R Burns, MD, 3 mL at 05/08/15 2115   Neurologic Examination:                                                                                                     Today's Vitals   05/09/15 1900 05/09/15 2101 05/10/15 0116 05/10/15 0510  BP: 152/69 172/74 143/65 159/69  Pulse: 74 92 89 79  Temp: 98.6 F (37 C) 97.7 F (36.5 C) 98.2 F (36.8 C) 98.1 F (36.7 C)  TempSrc: Oral Oral Oral Oral  Resp:  20 20 20   SpO2: 100% 100% 98% 98%    Evaluation of higher integrative functions including: Level of alertness: Alert,  Oriented to   place and person, not time Speech: mild to moderate spastic dysarthria , no aphasia noted.  Test the following cranial nerves: 2-12 grossly intact Motor examination: increased tone,  full motor strength in all  extremities Examination of deep tendon reflexes: 3+  in all extremities, normal plantars bilaterally     Lab Results: Basic Metabolic Panel:  Recent Labs Lab 05/07/15 0920 05/07/15 0927 05/08/15 0236 05/09/15 0552 05/10/15 0357  NA 139 141 139 139 139  K 4.5 4.5 3.7 4.3 4.1  CL 106 103 104 104 105  CO2 23  --  26 24 22   GLUCOSE 130* 127* 159* 159* 124*  BUN 20 22* 14 11 10   CREATININE 1.06 1.00 0.99 0.99 1.10  CALCIUM 9.1  --  8.6* 9.1 8.7*    Liver Function Tests:  Recent Labs Lab 05/07/15 0920  AST 23  ALT 14*  ALKPHOS 77  BILITOT 0.3  PROT  6.7  ALBUMIN 3.2*   No results for input(s): LIPASE, AMYLASE in the last 168 hours. No results for input(s): AMMONIA in the last 168 hours.  CBC:  Recent Labs Lab 05/07/15 0920 05/07/15 0927 05/08/15 0236 05/09/15 0552 05/10/15 0357  WBC 9.9  --  8.6 5.3 5.7  NEUTROABS 8.4*  --   --   --   --   HGB 10.4* 11.9* 9.1* 10.0* 9.5*  HCT 32.4* 35.0* 28.4* 30.2* 29.7*  MCV 96.4  --  96.9 94.1 95.5  PLT 241  --  222 252 240    Cardiac Enzymes:  Recent Labs Lab 05/08/15 1453  CKTOTAL 312    Lipid Panel: No results for input(s): CHOL, TRIG, HDL, CHOLHDL, VLDL, LDLCALC in the last 168 hours.  CBG:  Recent Labs Lab 05/09/15 1125 05/09/15 1657 05/09/15 2043 05/09/15 2339 05/10/15 0400  GLUCAP 145* 157* 171* 134* 124*    Microbiology: Results for orders placed or performed during the hospital encounter of 05/07/15  Culture, blood (routine x 2)     Status: None (Preliminary result)   Collection Time: 05/07/15  3:31 PM  Result Value  Ref Range Status   Specimen Description BLOOD RIGHT HAND  Final   Special Requests BOTTLES DRAWN AEROBIC ONLY 5CC  Final   Culture NO GROWTH 2 DAYS  Final   Report Status PENDING  Incomplete  Culture, blood (routine x 2)     Status: None (Preliminary result)   Collection Time: 05/07/15  3:45 PM  Result Value Ref Range Status   Specimen Description BLOOD LEFT HAND  Final   Special Requests BOTTLES DRAWN AEROBIC AND ANAEROBIC 5CC  Final   Culture NO GROWTH 2 DAYS  Final   Report Status PENDING  Incomplete  MRSA PCR Screening     Status: Abnormal   Collection Time: 05/08/15  3:19 AM  Result Value Ref Range Status   MRSA by PCR POSITIVE (A) NEGATIVE Final    Comment:        The GeneXpert MRSA Assay (FDA approved for NASAL specimens only), is one component of a comprehensive MRSA colonization surveillance program. It is not intended to diagnose MRSA infection nor to guide or monitor treatment for MRSA infections. RESULT CALLED TO,  READ BACK BY AND VERIFIED WITH: [facility]  05/08/15 MKELLY     Imaging: Ct Head Wo Contrast  05/07/2015  CLINICAL DATA:  Unresponsive and hypoglycemia. EXAM: CT HEAD WITHOUT CONTRAST TECHNIQUE: Contiguous axial images were obtained from the base of the skull through the vertex without intravenous contrast. COMPARISON:  04/19/2013 FINDINGS: Stable moderately advanced small vessel disease and bilateral deep old white matter lacunar infarcts. The brain demonstrates no evidence of hemorrhage, infarction, edema, mass effect, extra-axial fluid collection, hydrocephalus or mass lesion. The skull is unremarkable. IMPRESSION: Stable advanced small vessel disease and deep white matter lacunar infarcts. No acute findings by head CT. Electronically Signed   By: Irish Lack M.D.   On: 05/07/2015 10:52   Mr Brain Wo Contrast  05/07/2015  CLINICAL DATA:  65 year old male with previous infarct in 2015. Acute onset decreased mental status and confusion. Initial encounter. EXAM: MRI HEAD WITHOUT CONTRAST TECHNIQUE: Multiplanar, multiecho pulse sequences of the brain and surrounding structures were obtained without intravenous contrast. COMPARISON:  Head CT without contrast 1044 hours today and earlier. FINDINGS: Major intracranial vascular flow voids are stable. No restricted diffusion or evidence of acute infarction. Extensive chronic small vessel infarcts in the bilateral cerebellar hemispheres, brainstem, thalami, and bilateral corona radiata. Basal ganglia less affected. Minimal associated chronic micro hemorrhages. Since 2015, there has been no significant change. No midline shift, mass effect, evidence of mass lesion, ventriculomegaly, extra-axial collection or acute intracranial hemorrhage. Cervicomedullary junction and pituitary are within normal limits. Stable visualized cervical spine. Visible internal auditory structures appear normal. Stable trace mastoid fluid. Mild to moderate paranasal sinus  mucosal thickening is stable. Trace retained secretions in the nasopharynx. Negative orbit and scalp soft tissues. Visualized bone marrow signal is within normal limits. IMPRESSION: 1.  No acute intracranial abnormality. 2. Advanced chronic small vessel ischemic disease appears stable since 2015. Electronically Signed   By: Odessa Fleming M.D.   On: 05/07/2015 17:16   Dg Chest Port 1 View  05/07/2015  CLINICAL DATA:  66 year old male with a history of altered mental status EXAM: PORTABLE CHEST 1 VIEW COMPARISON:  04/18/2013, 04/04/2013 FINDINGS: Cardiomediastinal silhouette unchanged in size and contour. No evidence of pulmonary vascular congestion.  Nine Cardiac event recorder again projects over the left mediastinum. No confluent airspace disease or pneumothorax.  No pleural effusion. No displaced fracture. IMPRESSION: No radiographic evidence of acute cardiopulmonary disease. Signed,  Yvone Neu. Loreta Ave, DO [name] Radiology Electronically Signed   By: Gilmer Mor D.O.   On: 05/07/2015 09:37    Assessment and plan:   Kahlin Mark is an 65 y.o. male patient with improving mental status, more alert, follows commands easily. NPO, baclofen not started yet. Swallow eval today.  PT/OT.  When cleared by ST, start po baclofen 10 mg QHS EEG on Monday.  Follow up with out patient neurology after discharge.

## 2015-05-10 NOTE — Progress Notes (Signed)
Subjective: Manuel Higgins is an 65 y.o. male patient admitted with altered mental status from nursing facility, noted to have hypoglycemia with blood sugars in 40's at the time. His mental status is improved through the admission.     Exam: Filed Vitals:   05/10/15 0510 05/10/15 0907  BP: 159/69 111/65  Pulse: 79 86  Temp: 98.1 F (36.7 C) 97.9 F (36.6 C)  Resp: 20 20    HEENT-  Normocephalic, no lesions, without obvious abnormality.  Normal external eye and conjunctiva.  Normal TM's bilaterally.  Normal auditory canals and external ears. Normal external nose, mucus membranes and septum.  Normal pharynx. Cardiovascular- S1, S2 normal, pulses palpable throughout   Lungs- chest clear, no wheezing, rales, normal symmetric air entry Abdomen- normal findings: bowel sounds normal Extremities- no edema Lymph-no adenopathy palpable Musculoskeletal-no joint tenderness, deformity or swelling Skin-warm and dry, no hyperpigmentation, vitiligo, or suspicious lesions    Gen: In bed, NAD MS:  CN: Motor:  Sensory: DTR:  Pertinent Labs:  Glucose 124  Felicie MornDavid Smith PA-C [facility] [phone]  Impression: 65 YO male with AMS in setting of hypoglycemia. Apparently at baselien per IM notes.    Recommendations: No further recs at this time, please call with further questions or concerns.     05/10/2015, 9:42 AM

## 2015-05-10 NOTE — Progress Notes (Signed)
EEG completed; results pending.    

## 2015-05-10 NOTE — Clinical Social Work Note (Signed)
[facility]. Patient to be transported via [facility].  RN report number: [id number]  Marcelline Deistmily Audreana Hancox, LCSW [phone] Orthopedics: [phone] Surgical: [phone]

## 2015-05-12 LAB — CULTURE, BLOOD (ROUTINE X 2)
CULTURE: NO GROWTH
Culture: NO GROWTH

## 2015-06-12 ENCOUNTER — Observation Stay (HOSPITAL_COMMUNITY)
Admission: EM | Admit: 2015-06-12 | Discharge: 2015-06-14 | Disposition: A | Discharge status: Skilled Nursing Facility | Payer: Medicaid Other | Attending: Family Medicine | Admitting: Family Medicine

## 2015-06-12 ENCOUNTER — Encounter (HOSPITAL_COMMUNITY): Payer: Self-pay | Admitting: Emergency Medicine

## 2015-06-12 DIAGNOSIS — E876 Hypokalemia: Secondary | ICD-10-CM | POA: Diagnosis not present

## 2015-06-12 DIAGNOSIS — E160 Drug-induced hypoglycemia without coma: Secondary | ICD-10-CM | POA: Diagnosis present

## 2015-06-12 DIAGNOSIS — T383X5A Adverse effect of insulin and oral hypoglycemic [antidiabetic] drugs, initial encounter: Secondary | ICD-10-CM

## 2015-06-12 DIAGNOSIS — Z7982 Long term (current) use of aspirin: Secondary | ICD-10-CM | POA: Insufficient documentation

## 2015-06-12 DIAGNOSIS — N179 Acute kidney failure, unspecified: Secondary | ICD-10-CM | POA: Diagnosis not present

## 2015-06-12 DIAGNOSIS — E785 Hyperlipidemia, unspecified: Secondary | ICD-10-CM | POA: Diagnosis not present

## 2015-06-12 DIAGNOSIS — Z79899 Other long term (current) drug therapy: Secondary | ICD-10-CM | POA: Diagnosis not present

## 2015-06-12 DIAGNOSIS — F0390 Unspecified dementia without behavioral disturbance: Secondary | ICD-10-CM | POA: Insufficient documentation

## 2015-06-12 DIAGNOSIS — I69311 Memory deficit following cerebral infarction: Secondary | ICD-10-CM | POA: Insufficient documentation

## 2015-06-12 DIAGNOSIS — E162 Hypoglycemia, unspecified: Secondary | ICD-10-CM | POA: Diagnosis not present

## 2015-06-12 DIAGNOSIS — Z8673 Personal history of transient ischemic attack (TIA), and cerebral infarction without residual deficits: Secondary | ICD-10-CM | POA: Insufficient documentation

## 2015-06-12 DIAGNOSIS — I10 Essential (primary) hypertension: Secondary | ICD-10-CM | POA: Diagnosis not present

## 2015-06-12 DIAGNOSIS — E11649 Type 2 diabetes mellitus with hypoglycemia without coma: Principal | ICD-10-CM | POA: Insufficient documentation

## 2015-06-12 DIAGNOSIS — Z794 Long term (current) use of insulin: Secondary | ICD-10-CM | POA: Diagnosis not present

## 2015-06-12 DIAGNOSIS — Z87891 Personal history of nicotine dependence: Secondary | ICD-10-CM | POA: Diagnosis not present

## 2015-06-12 LAB — CBC WITH DIFFERENTIAL/PLATELET
Basophils Absolute: 0 10*3/uL (ref 0.0–0.1)
Basophils Relative: 0 %
EOS PCT: 2 %
Eosinophils Absolute: 0.2 10*3/uL (ref 0.0–0.7)
HEMATOCRIT: 34.9 % — AB (ref 39.0–52.0)
Hemoglobin: 11.2 g/dL — ABNORMAL LOW (ref 13.0–17.0)
LYMPHS ABS: 2.2 10*3/uL (ref 0.7–4.0)
LYMPHS PCT: 24 %
MCH: 30.4 pg (ref 26.0–34.0)
MCHC: 32.1 g/dL (ref 30.0–36.0)
MCV: 94.8 fL (ref 78.0–100.0)
MONO ABS: 0.8 10*3/uL (ref 0.1–1.0)
MONOS PCT: 8 %
NEUTROS ABS: 6.1 10*3/uL (ref 1.7–7.7)
Neutrophils Relative %: 66 %
PLATELETS: 185 10*3/uL (ref 150–400)
RBC: 3.68 MIL/uL — ABNORMAL LOW (ref 4.22–5.81)
RDW: 12.6 % (ref 11.5–15.5)
WBC: 9.3 10*3/uL (ref 4.0–10.5)

## 2015-06-12 LAB — GLUCOSE, CAPILLARY
GLUCOSE-CAPILLARY: 226 mg/dL — AB (ref 65–99)
Glucose-Capillary: 152 mg/dL — ABNORMAL HIGH (ref 65–99)

## 2015-06-12 LAB — CBG MONITORING, ED
GLUCOSE-CAPILLARY: 140 mg/dL — AB (ref 65–99)
GLUCOSE-CAPILLARY: 96 mg/dL (ref 65–99)
GLUCOSE-CAPILLARY: 99 mg/dL (ref 65–99)
GLUCOSE-CAPILLARY: 103 mg/dL — AB (ref 65–99)
Glucose-Capillary: 79 mg/dL (ref 65–99)
Glucose-Capillary: 20 mg/dL — CL (ref 65–99)

## 2015-06-12 LAB — COMPREHENSIVE METABOLIC PANEL
ALBUMIN: 3.7 g/dL (ref 3.5–5.0)
ALT: 16 U/L — ABNORMAL LOW (ref 17–63)
AST: 22 U/L (ref 15–41)
Alkaline Phosphatase: 100 U/L (ref 38–126)
Anion gap: 12 (ref 5–15)
BILIRUBIN TOTAL: 0.5 mg/dL (ref 0.3–1.2)
BUN: 22 mg/dL — AB (ref 6–20)
CHLORIDE: 106 mmol/L (ref 101–111)
CO2: 26 mmol/L (ref 22–32)
Calcium: 8.8 mg/dL — ABNORMAL LOW (ref 8.9–10.3)
Creatinine, Ser: 1.27 mg/dL — ABNORMAL HIGH (ref 0.61–1.24)
GFR calc Af Amer: >60 mL/min (ref >60)
GFR calc non Af Amer: 58 mL/min — ABNORMAL LOW (ref >60)
GLUCOSE: 29 mg/dL — AB (ref 65–99)
POTASSIUM: 3.4 mmol/L — AB (ref 3.5–5.1)
SODIUM: 144 mmol/L (ref 135–145)
Total Protein: 6.6 g/dL (ref 6.5–8.1)

## 2015-06-12 LAB — MRSA PCR SCREENING: MRSA BY PCR: NEGATIVE

## 2015-06-12 MED ORDER — KCL IN DEXTROSE-NACL 20-5-0.9 MEQ/L-%-% IV SOLN
INTRAVENOUS | Status: DC
  Administered 2015-06-13: 01:00:00 via INTRAVENOUS
  Filled 2015-06-12 (×4): qty 1000

## 2015-06-12 MED ORDER — DEXTROSE 50 % IV SOLN
1.0000 | Freq: Once | INTRAVENOUS | Status: AC
  Administered 2015-06-12: 50 mL via INTRAVENOUS

## 2015-06-12 MED ORDER — ASPIRIN 81 MG PO CHEW
81.0000 mg | CHEWABLE_TABLET | Freq: Every day | ORAL | Status: DC
  Administered 2015-06-13 – 2015-06-14 (×2): 81 mg via ORAL
  Filled 2015-06-12 (×2): qty 1

## 2015-06-12 MED ORDER — HYDRALAZINE HCL 20 MG/ML IJ SOLN
10.0000 mg | Freq: Four times a day (QID) | INTRAMUSCULAR | Status: DC | PRN
  Administered 2015-06-12 – 2015-06-13 (×2): 10 mg via INTRAVENOUS
  Filled 2015-06-12 (×2): qty 1

## 2015-06-12 MED ORDER — ENOXAPARIN SODIUM 40 MG/0.4ML ~~LOC~~ SOLN: 40.0000 mg | SUBCUTANEOUS | Status: DC

## 2015-06-12 MED ORDER — SODIUM CHLORIDE 0.9% FLUSH
3.0000 mL | Freq: Two times a day (BID) | INTRAVENOUS | Status: DC
  Administered 2015-06-13 – 2015-06-14 (×3): 3 mL via INTRAVENOUS

## 2015-06-12 MED ORDER — LISINOPRIL 10 MG PO TABS
10.0000 mg | ORAL_TABLET | Freq: Every day | ORAL | Status: DC
  Administered 2015-06-13 – 2015-06-14 (×2): 10 mg via ORAL
  Filled 2015-06-12 (×2): qty 1

## 2015-06-12 MED ORDER — VITAMIN D 1000 UNITS PO TABS
2000.0000 [IU] | ORAL_TABLET | Freq: Every day | ORAL | Status: DC
  Administered 2015-06-13 – 2015-06-14 (×2): 2000 [IU] via ORAL
  Filled 2015-06-12 (×2): qty 2

## 2015-06-12 MED ORDER — DEXTROSE-NACL 5-0.45 % IV SOLN
INTRAVENOUS | Status: DC
  Administered 2015-06-12: 15:00:00 via INTRAVENOUS

## 2015-06-12 MED ORDER — ATORVASTATIN CALCIUM 80 MG PO TABS: 80.0000 mg | ORAL_TABLET | Freq: Every day | ORAL | Status: DC

## 2015-06-12 MED ORDER — BACLOFEN 10 MG PO TABS
10.0000 mg | ORAL_TABLET | Freq: Every day | ORAL | Status: DC
  Administered 2015-06-12 – 2015-06-13 (×2): 10 mg via ORAL
  Filled 2015-06-12 (×2): qty 1

## 2015-06-12 MED ORDER — DEXTROSE 50 % IV SOLN
INTRAVENOUS | Status: AC
  Filled 2015-06-12: qty 50

## 2015-06-12 NOTE — ED Notes (Signed)
MAR sent with pt from facility from 12/2014 and 01/2015. Pharmacy tech called facility to get updated MAR.Upon receipt of updated MAR, numerous meds from first list discontinued and some new meds added. RN called facility to question type and amount of insulin given since insulin was not listed on the 2nd MAR. Med tech at facility, Karin GoldenLorraine, stated to RN "I did not give him any insulin this morning because I was running behind. Then when I found him unresponsive, I didn't give it then either." After looking for appropriate paperwork, Med tech also sts that the insulin is on a different MAR and asks "Do you need that one too?" Pt reports that staff gave him an insulin shot this morning at approx 0700. Upon further examination of MAR, RN and pharm tech noted that there was documentation for several medications administered on 06/13/2015.

## 2015-06-12 NOTE — ED Notes (Signed)
Paged SW to come down and speak with Engineer, waterMelissa RN

## 2015-06-12 NOTE — ED Notes (Signed)
CBG is 20.

## 2015-06-12 NOTE — ED Notes (Signed)
MD at bedside.Madilyn Hookees. Pt given another sandwich. Pt is alert. Will recheck cbg.

## 2015-06-12 NOTE — ED Notes (Signed)
CBG is 140. °

## 2015-06-12 NOTE — ED Notes (Signed)
Pt given sandwich and OJ. MD at bedside.

## 2015-06-12 NOTE — ED Notes (Signed)
Per Nursing home staff pt was found unresponsive. Upon EMS arrival, CBG was 44. EMS administered 12.5g D50. Pt then became alert and oriented. CBG dropped again in route to 59 with no mental status change. EMS administered the remaining 12.5g D50. EMS reported a CBG of 165 upon arrival to EMS bay. CBG 79 at this time. Unknown amount of insulin given at facility, Unknown last meal.

## 2015-06-12 NOTE — ED Provider Notes (Signed)
CSN: [id number]     Arrival date & time 06/12/15  1303 History   First MD Initiated Contact with Patient 06/12/15 1311     Chief Complaint  Patient presents with  . Hypoglycemia     Patient is a 65 y.o. male presenting with hypoglycemia. The history is provided by the patient. No language interpreter was used.  Hypoglycemia  Manuel Higgins is a 65 y.o. male who presents to the Emergency Department complaining of hypoglycemia.  Level 5 caveat due to confusion. Per EMS report the patient was found unresponsive at his nursing home. Blood sugar was 44 that time and he was received 12.5 g of D50 and he began to become alert and oriented. In transport to the emergency department his blood sugar dropped to 59 with no change in mental status. The patient then received the remaining 12.5 g of D50. Patient reports no recent illness and has no complaints. He states he is eating and drinking without difficulties. He did eat breakfast this morning. He does not recall any medication changes.There is some confusion on nursing home report about how much insulin the patient did receive today there is a question of one unit of Levemir or 10 units of Levemir.  Past Medical History  Diagnosis Date  . Hypertension   . Diabetes mellitus without complication (HCC)   . Stroke [facility](HCC) 03/2013    Multiple infarcts  . Hypertensive crisis 02/15/2013  . Toe ulcer (HCC)   . Anemia   . Dysphagia   . Hyperlipidemia   . Dementia     unspecified w/o behavioral disturbance   Past Surgical History  Procedure Laterality Date  . Back surgery    . Tonsillectomy    . Tee without cardioversion N/A 04/07/2013    Procedure: TRANSESOPHAGEAL ECHOCARDIOGRAM (TEE);  Surgeon: Lars MassonKatarina H Nelson, MD;  [facility];  Service: Cardiovascular;  Laterality: N/A;  . Loop recorder implant  04/08/2013    MDT LinQ implanted by Dr Ladona Ridgelaylor for cryptogenic stroke  . Loop recorder implant N/A 04/08/2013    Procedure: LOOP RECORDER IMPLANT;   Surgeon: Marinus MawGregg W Taylor, MD;  [facility];  Service: Cardiovascular;  Laterality: N/A;   Family History  Problem Relation Age of Onset  . Family history unknown: Yes   Social History  Substance Use Topics  . Smoking status: Former Smoker -- 0.33 packs/day for 10 years    Types: Cigarettes    Quit date: 01/17/2004  . Smokeless tobacco: Never Used  . Alcohol Use: No     Comment: Denies Currently but previously marked as yes    Review of Systems  All other systems reviewed and are negative.     Allergies  Review of patient's allergies indicates no known allergies.  Home Medications   Prior to Admission medications   Medication Sig Start Date End Date Taking? Authorizing Provider  aspirin 81 MG chewable tablet Chew 1 tablet (81 mg total) by mouth daily. 05/10/15  Yes Selina CooleyKyle Flores, MD  atorvastatin (LIPITOR) 80 MG tablet Take 1 tablet (80 mg total) by mouth daily at 6 PM. 05/10/15  Yes Selina CooleyKyle Flores, MD  baclofen (LIORESAL) 10 MG tablet Take 1 tablet (10 mg total) by mouth at bedtime. 05/10/15  Yes Selina CooleyKyle Flores, MD  Cholecalciferol (VITAMIN D) 2000 units CAPS Take 2,000 Units by mouth daily.   Yes Historical Provider, MD  insulin aspart (NOVOLOG) 100 UNIT/ML injection Inject 0-10 Units into the skin 3 (three) times daily before meals. Sliding scale.  150-200-  2 units, 201-250- 4 units. 251-300. 6 units. 301-350-8 units. 351-400- 10 units   Yes Historical Provider, MD  insulin detemir (LEVEMIR) 100 UNIT/ML injection Inject 0.1 mLs (10 Units total) into the skin at bedtime. Patient taking differently: Inject 15 Units into the skin every morning.  05/10/15  Yes Rushil Terrilee Croak, MD  lisinopril (PRINIVIL,ZESTRIL) 10 MG tablet Take 1 tablet (10 mg total) by mouth daily. 04/09/13  Yes Ripudeep Jenna Luo, MD  metFORMIN (GLUCOPHAGE) 1000 MG tablet Take 1,000 mg by mouth 2 (two) times daily with a meal.   Yes Historical Provider, MD  naphazoline (NAPHCON) 0.1 % ophthalmic solution Place 1 drop  into both eyes 4 (four) times daily as needed for irritation. 02/16/13  Yes Andrena Mews, DO  metFORMIN (GLUCOPHAGE) 1000 MG tablet Take 1/2 pill twice daily for 1 week, then 1 pill twice daily therafter 05/10/15   Selina Cooley, MD   BP 166/69 mmHg  Pulse 84  Temp(Src) 97.8 F (36.6 C) (Oral)  Resp 15  SpO2 100% Physical Exam  Constitutional: He is oriented to person, place, and time. He appears well-developed and well-nourished.  HENT:  Head: Normocephalic and atraumatic.  Cardiovascular: Normal rate and regular rhythm.   No murmur heard. Pulmonary/Chest: Effort normal and breath sounds normal. No respiratory distress.  Abdominal: Soft. There is no tenderness. There is no rebound and no guarding.  Musculoskeletal: He exhibits no edema or tenderness.  Neurological: He is alert and oriented to person, place, and time.  Slow to answer questions. Mildly confused.  Skin: Skin is warm and dry.  Psychiatric: He has a normal mood and affect. His behavior is normal.  Nursing note and vitals reviewed.   ED Course  Procedures   CRITICAL CARE Performed by: Tilden Fossa   Total critical care time: 30 minutes  Critical care time was exclusive of separately billable procedures and treating other patients.  Critical care was necessary to treat or prevent imminent or life-threatening deterioration.  Critical care was time spent personally by me on the following activities: development of treatment plan with patient and/or surrogate as well as nursing, discussions with consultants, evaluation of patient's response to treatment, examination of patient, obtaining history from patient or surrogate, ordering and performing treatments and interventions, ordering and review of laboratory studies, ordering and review of radiographic studies, pulse oximetry and re-evaluation of patient's condition.   Labs Review Labs Reviewed  COMPREHENSIVE METABOLIC PANEL - Abnormal; Notable for the following:     Potassium 3.4 (*)    Glucose, Bld 29 (*)    BUN 22 (*)    Creatinine, Ser 1.27 (*)    Calcium 8.8 (*)    ALT 16 (*)    GFR calc non Af Amer 58 (*)    All other components within normal limits  CBC WITH DIFFERENTIAL/PLATELET - Abnormal; Notable for the following:    RBC 3.68 (*)    Hemoglobin 11.2 (*)    HCT 34.9 (*)    All other components within normal limits  CBG MONITORING, ED - Abnormal; Notable for the following:    Glucose-Capillary 20 (*)    All other components within normal limits  CBG MONITORING, ED - Abnormal; Notable for the following:    Glucose-Capillary 140 (*)    All other components within normal limits  URINALYSIS, ROUTINE W REFLEX MICROSCOPIC (NOT AT [facility])  CBG MONITORING, ED  CBG MONITORING, ED  CBG MONITORING, ED    Imaging Review No results found.  I have personally reviewed and evaluated these images and lab results as part of my medical decision-making.   EKG Interpretation None      MDM   Final diagnoses:  Hypoglycemia    Patient presents to the emergency department after being found unresponsive and hypoglycemic. His blood sugar did transiently improve with D50 administration followed by recurrent hypoglycemia. He ate multiple turkeys images in the department and had additional D50 bolus and had recurrent hypoglycemia. He was started on a D5 drip for recurrent hypoglycemia. He was started on metformin May 8 according to his [facility] and there is question regarding how much Levemir he is receiving and his assisted living facility. Plan to admit for observation given recurrent hypoglycemia.    Tilden Fossa, MD 06/12/15 [phone]

## 2015-06-12 NOTE — H&P (Signed)
[facility] History and Physical  Manuel Higgins ZOX:[id number] DOB: November 04, 1950 DOA: 06/12/2015  Referring physician:  PCP: Bernerd Limbo  Specialists:   Chief Complaint: hypoglycemia   HPI: Manuel Higgins is a 65 y.o. male with PMH of HTN, CVA, Dementia, DM, presented from assisted living facility with hypoglycemia. Per EMS: he was confused, glucose was 44. He improved after receiving d50. But then dropped his glucose to 20's in ED. He was alert, responsive. Started on glucose drip, hospitalist is called for overnight observation -patient is alert on my exam, confused to time. Denies acute chest pains, no SOB, no cough, no fever, chills. He denies nausea, vomiting or diarrhea, no abdominal pains. He says that he takes insulin at  The facility  Review of Systems: The patient denies anorexia, fever, weight loss,, vision loss, decreased hearing, hoarseness, chest pain, syncope, dyspnea on exertion, peripheral edema, balance deficits, hemoptysis, abdominal pain, melena, hematochezia, severe indigestion/heartburn, hematuria, incontinence, genital sores, muscle weakness, suspicious skin lesions, transient blindness, difficulty walking, depression, unusual weight change, abnormal bleeding, enlarged lymph nodes, angioedema, and breast masses.    Past Medical History  Diagnosis Date  . Hypertension   . Diabetes mellitus without complication (HCC)   . Stroke [facility]) 03/2013    Multiple infarcts  . Hypertensive crisis 02/15/2013  . Toe ulcer (HCC)   . Anemia   . Dysphagia   . Hyperlipidemia   . Dementia     unspecified w/o behavioral disturbance   Past Surgical History  Procedure Laterality Date  . Back surgery    . Tonsillectomy    . Tee without cardioversion N/A 04/07/2013    Procedure: TRANSESOPHAGEAL ECHOCARDIOGRAM (TEE);  Surgeon: Lars Masson, MD;  Location: [facility] ENDOSCOPY;  Service: Cardiovascular;  Laterality: N/A;  . Loop recorder implant  04/08/2013    MDT LinQ implanted by Dr  Ladona Ridgel for cryptogenic stroke  . Loop recorder implant N/A 04/08/2013    Procedure: LOOP RECORDER IMPLANT;  Surgeon: Marinus Maw, MD;  Location: [facility] CATH LAB;  Service: Cardiovascular;  Laterality: N/A;   Social History:  reports that he quit smoking about 11 years ago. His smoking use included Cigarettes. He has a 3.3 pack-year smoking history. He has never used smokeless tobacco. He reports that he does not drink alcohol or use illicit drugs. ALF;  where does patient live--home, ALF, SNF? and with whom if at home? Yes;  Can patient participate in ADLs?  No Known Allergies  Family History  Problem Relation Age of Onset  . Family history unknown: Yes    (be sure to complete)  Prior to Admission medications   Medication Sig Start Date End Date Taking? Authorizing Provider  aspirin 81 MG chewable tablet Chew 1 tablet (81 mg total) by mouth daily. 05/10/15  Yes Selina Cooley, MD  atorvastatin (LIPITOR) 80 MG tablet Take 1 tablet (80 mg total) by mouth daily at 6 PM. 05/10/15  Yes Selina Cooley, MD  baclofen (LIORESAL) 10 MG tablet Take 1 tablet (10 mg total) by mouth at bedtime. 05/10/15  Yes Selina Cooley, MD  Cholecalciferol (VITAMIN D) 2000 units CAPS Take 2,000 Units by mouth daily.   Yes Historical Provider, MD  insulin aspart (NOVOLOG) 100 UNIT/ML injection Inject 0-10 Units into the skin 3 (three) times daily before meals. Sliding scale. 150-200-  2 units, 201-250- 4 units. 251-300. 6 units. 301-350-8 units. 351-400- 10 units   Yes Historical Provider, MD  insulin detemir (LEVEMIR) 100 UNIT/ML injection Inject 0.1 mLs (10 Units total) into  the skin at bedtime. Patient taking differently: Inject 15 Units into the skin every morning.  05/10/15  Yes Rushil Terrilee CroakPatel V, MD  lisinopril (PRINIVIL,ZESTRIL) 10 MG tablet Take 1 tablet (10 mg total) by mouth daily. 04/09/13  Yes Ripudeep Jenna LuoK Rai, MD  metFORMIN (GLUCOPHAGE) 1000 MG tablet Take 1,000 mg by mouth 2 (two) times daily with a meal.   Yes Historical  Provider, MD  naphazoline (NAPHCON) 0.1 % ophthalmic solution Place 1 drop into both eyes 4 (four) times daily as needed for irritation. 02/16/13  Yes Andrena MewsMichael D Rigby, DO  metFORMIN (GLUCOPHAGE) 1000 MG tablet Take 1/2 pill twice daily for 1 week, then 1 pill twice daily therafter 05/10/15   Selina CooleyKyle Flores, MD   Physical Exam: Filed Vitals:   06/12/15 1430 06/12/15 1445  BP: 154/76 166/69  Pulse: 88 84  Temp:    Resp: 17 15     General:  Alert, confused to time   Eyes: eom-i  ENT:  No oral ulcers   Neck: supple, no JVD   Cardiovascular: s1,s2 rrr  Respiratory: CTA BL   Abdomen: soft, nt,nd   Skin: no rash   Musculoskeletal: no leg edema   Psychiatric: no hallucinations   Neurologic: CN 2-12 intact. Alert, confused to time  Labs on Admission:  Basic Metabolic Panel:  Recent Labs Lab 06/12/15 1337  NA 144  K 3.4*  CL 106  CO2 26  GLUCOSE 29*  BUN 22*  CREATININE 1.27*  CALCIUM 8.8*   Liver Function Tests:  Recent Labs Lab 06/12/15 1337  AST 22  ALT 16*  ALKPHOS 100  BILITOT 0.5  PROT 6.6  ALBUMIN 3.7   No results for input(s): LIPASE, AMYLASE in the last 168 hours. No results for input(s): AMMONIA in the last 168 hours. CBC:  Recent Labs Lab 06/12/15 1337  WBC 9.3  NEUTROABS 6.1  HGB 11.2*  HCT 34.9*  MCV 94.8  PLT 185   Cardiac Enzymes: No results for input(s): CKTOTAL, CKMB, CKMBINDEX, TROPONINI in the last 168 hours.  BNP (last 3 results) No results for input(s): BNP in the last 8760 hours.  ProBNP (last 3 results) No results for input(s): PROBNP in the last 8760 hours.  CBG:  Recent Labs Lab 06/12/15 1305 06/12/15 1352 06/12/15 1412 06/12/15 1445 06/12/15 1509  GLUCAP 79 20* 140* 99 96    Radiological Exams on Admission: No results found.  EKG: Independently reviewed.   Assessment/Plan Active Problems:   Hypoglycemia  65 y.o. male with PMH of HTN, CVA, Dementia, DM, presented from assisted living facility with  hypoglycemia.   DM with Hypoglycemia. Probable due to insulin. Recent similar episodes of hypoglycemia -we will cont IV glucose, monitor. hold insulin, check ha1c. ? Need outpatient insulin treatment  Mild AKI, hypokalemia. Will cont IVF, replace lytes. Recheck AM  HTN. Resume lisinopril.  Dementia. Mild confusion. Neuro exam is non focal. Cont to treat hypoglycemia. Mental status is close to baseline   None;  if consultant consulted, please document name and whether formally or informally consulted  Code Status: full;  (must indicate code status--if unknown or must be presumed, indicate so) Family Communication: d/w patient, ED (indicate person spoken with, if applicable, with phone number if by telephone) Disposition Plan: ALF in 24-48 hrs  (indicate anticipated LOS)  Time spent: >45 minutes   Esperanza SheetsBURIEV, Jozie Wulf N [facility] Pager [phone]  If 7PM-7AM, please contact night-coverage [email or web address] Password [email or web address] 06/12/2015, 3:19 PM

## 2015-06-13 DIAGNOSIS — E162 Hypoglycemia, unspecified: Secondary | ICD-10-CM

## 2015-06-13 LAB — URINALYSIS, ROUTINE W REFLEX MICROSCOPIC
BILIRUBIN URINE: NEGATIVE
GLUCOSE, UA: 250 mg/dL — AB
HGB URINE DIPSTICK: NEGATIVE
Ketones, ur: NEGATIVE mg/dL
Leukocytes, UA: NEGATIVE
Nitrite: NEGATIVE
PROTEIN: NEGATIVE mg/dL
Specific Gravity, Urine: 1.011 (ref 1.005–1.030)
pH: 7 (ref 5.0–8.0)

## 2015-06-13 LAB — BASIC METABOLIC PANEL
Anion gap: 6 (ref 5–15)
BUN: 19 mg/dL (ref 6–20)
CALCIUM: 8.8 mg/dL — AB (ref 8.9–10.3)
CO2: 28 mmol/L (ref 22–32)
CREATININE: 1.02 mg/dL (ref 0.61–1.24)
Chloride: 106 mmol/L (ref 101–111)
Glucose, Bld: 159 mg/dL — ABNORMAL HIGH (ref 65–99)
Potassium: 4 mmol/L (ref 3.5–5.1)
SODIUM: 140 mmol/L (ref 135–145)

## 2015-06-13 LAB — GLUCOSE, CAPILLARY
GLUCOSE-CAPILLARY: 142 mg/dL — AB (ref 65–99)
GLUCOSE-CAPILLARY: 147 mg/dL — AB (ref 65–99)
GLUCOSE-CAPILLARY: 149 mg/dL — AB (ref 65–99)
Glucose-Capillary: 155 mg/dL — ABNORMAL HIGH (ref 65–99)
Glucose-Capillary: 231 mg/dL — ABNORMAL HIGH (ref 65–99)
Glucose-Capillary: 235 mg/dL — ABNORMAL HIGH (ref 65–99)

## 2015-06-13 NOTE — Progress Notes (Signed)
Manuel Higgins ZOX:[id number] DOB: 12/31/1950 DOA: 06/12/2015 PCP: Bernerd LimboMITCHELL, [name]  Brief narrative:  9263 ? Htn Ty II DM CVA 3.20.15-multiple acute/subacute [was on POINT trial]-on asa 325 now Implanted loop recorder placed showed no A. fib Hypertension ? 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 Buerger's disease   Past medical history-As per Problem list Chart reviewed as below- reviewed  Consultants:  None  Procedures:  None  Antibiotics:  none   Subjective  Slow speech Tolerating diet at bedside Deficits in the left upper extremity with some breakdown on the fingertips Blood sugars have come up No chest pain no nausea no vomiting    Objective    Interim History:   Telemetry: Benign   Objective: Filed Vitals:   06/12/15 2057 06/12/15 2359 06/13/15 0429 06/13/15 1313  BP: 186/76 151/66 184/70 123/74  Pulse: 88 87 77 82  Temp: 98.2 F (36.8 C)  97.8 F (36.6 C) 97.6 F (36.4 C)  TempSrc:      Resp: 16  16 19   Height:      Weight:      SpO2: 99%  99% 98%    Intake/Output Summary (Last 24 hours) at 06/13/15 1349 Last data filed at 06/13/15 [id number]  Gross per 24 hour  Intake   1440 ml  Output    200 ml  Net   1240 ml    Exam:  General: EOMI NCAT slightly disheveled Flat affect Smile symmetric Finger-nose-finger intact Distal fingertip ulcers noted but healing Cardiovascular: S1-S2 no murmur rub or gallop Respiratory: Clinically clear no added sound Abdomen: Soft nontender nondistended no rebound Skin see above discussion Neuro intact3 mostly other than some mild deficits left upper extremity  Data Reviewed: Basic Metabolic Panel:  Recent Labs Lab 06/12/15 1337 06/13/15 0405  NA 144 140  K 3.4* 4.0  CL 106 106  CO2 26 28    GLUCOSE 29* 159*  BUN 22* 19  CREATININE 1.27* 1.02  CALCIUM 8.8* 8.8*   Liver Function Tests:  Recent Labs Lab 06/12/15 1337  AST 22  ALT 16*  ALKPHOS 100  BILITOT 0.5  PROT 6.6  ALBUMIN 3.7   No results for input(s): LIPASE, AMYLASE in the last 168 hours. No results for input(s): AMMONIA in the last 168 hours. CBC:  Recent Labs Lab 06/12/15 1337  WBC 9.3  NEUTROABS 6.1  HGB 11.2*  HCT 34.9*  MCV 94.8  PLT 185   Cardiac Enzymes: No results for input(s): CKTOTAL, CKMB, CKMBINDEX, TROPONINI in the last 168 hours. BNP: Invalid input(s): POCBNP CBG:  Recent Labs Lab 06/12/15 2055 06/12/15 2359 06/13/15 0426 06/13/15 0737 06/13/15 1142  GLUCAP 226* 147* 142* 149* 155*    Recent Results (from the past 240 hour(s))  MRSA PCR Screening     Status: None   Collection Time: 06/12/15  5:23 PM  Result Value Ref Range Status   MRSA by PCR NEGATIVE NEGATIVE Final    Comment:        The GeneXpert MRSA Assay (FDA approved for NASAL specimens only), is one component of a comprehensive MRSA colonization surveillance program. It is not intended to diagnose MRSA infection nor to guide or monitor treatment for MRSA infections.      Studies:              All Imaging reviewed and is as per  above notation   Scheduled Meds: . aspirin  81 mg Oral Daily  . atorvastatin  80 mg Oral q1800  . baclofen  10 mg Oral QHS  . cholecalciferol  2,000 Units Oral Daily  . enoxaparin (LOVENOX) injection  40 mg Subcutaneous Q24H  . lisinopril  10 mg Oral Daily  . sodium chloride flush  3 mL Intravenous Q12H   Continuous Infusions: . dextrose 5 % and 0.9 % NaCl with KCl 20 mEq/L 75 mL/hr at 06/13/15 0111     Assessment/Plan:  1. Severe hypoglycemia-tells me he has had similar episodes in the past month at least 1 month for a short period of time. Eating and drinking now. Hold insulin Levemir 10 units and sliding scale. Blood sugars ranging 142-155.  Discontinue  D5 2. Diabetes mellitus type 2-monitor trends--reimplement metformin at outpatient doses on discharge 3. Previous CVA with multi-infarct dementia-monitor. Attempt to trial off baclofen in the outpatient setting-continue aspirin 81 daily 4. Hypertension-continue lisinopril 10 daily  No family present-called Theresia Lo at [phone].  No answer Full code Inpatient telemetry Likely discharge back to ALF 06/14/2015 if hypoglycemia is resolved and Consultants: none  Pleas Koch, MD  [facility] Pager [phone] 06/13/2015, 1:49 PM

## 2015-06-13 NOTE — Clinical Social Work Note (Signed)
Clinical Social Work Assessment  Patient Details  Name: Manuel Higgins MRN: [id number] Date of Birth: 1950/06/06  Date of referral:  06/13/15               Reason for consult:  Facility Placement                Permission sought to share information with:  Facility Sport and exercise psychologist, Family Supports Permission granted to share information::  Yes, Verbal Permission Granted  Name::        Agency::     Relationship::     Contact Information:     Housing/Transportation Living arrangements for the past 2 months:  [place] of Information:  Medical Team, Patient Patient Interpreter Needed:  None Criminal Activity/Legal Involvement Pertinent to Current Situation/Hospitalization:  No - Comment as needed Significant Relationships:  Siblings Lives with:  Facility Resident Do you feel safe going back to the place where you live?  Yes Need for family participation in patient care:  Yes (Comment)  Care giving concerns:  Pt did not express any care giving concerns at this time. Pt family not at bed site during assessment. CSW made attempt to speak with siblings via phone and left message.    Social Worker assessment / plan: Holiday representative met with pt to discuss CSW role in discharge planning. Pt somewhat disoriented during interview. Pt acknowlwdged that he was from ALF and had no concerns with current ALF , and pt agreeable to return back to ALF when appropriate.   CSW will continue to assist pt in discharge process.   Employment status:  Retired Forensic scientist:  Medicaid [place] PT Recommendations:    Information / Referral to community resources:     Patient/Family's Response to care: Pt sitting up in bed, Pt very quiet and somewhat disoriented.   Patient/Family's Understanding of and Emotional Response to Diagnosis, Current Treatment, and Prognosis:  Pt stated desire to return to ALF when medically stable to be discharged.   Emotional  Assessment Appearance:  Well-Groomed, Appears younger than stated age Attitude/Demeanor/Rapport:   (Pleasant ) Affect (typically observed):  Quiet, Accepting Orientation:  Oriented to Self, Oriented to Place, Oriented to  Time, Oriented to Situation Alcohol / Substance use:  Not Applicable Psych involvement (Current and /or in the community):  No (Comment)  Discharge Needs  Concerns to be addressed:  Discharge Planning Concerns Readmission within the last 30 days:  No Current discharge risk:  None Barriers to Discharge:  Continued Medical Work up  Gracy Bruins, MSW, Bryan Lemma, LCSW 06/13/2015, 2:40 PM

## 2015-06-13 NOTE — NC FL2 (Signed)
[place] MEDICAID FL2 LEVEL OF CARE SCREENING TOOL     IDENTIFICATION  Patient Name: Oniel Meleski Birthdate: 03-22-1950 Sex: male Admission Date (Current Location): 06/12/2015  [facility] and [place] Number:  Producer, television/film/video and Address:  [facility], [address] [place], [place] [address]      Provider Number: [id number]  Attending Physician Name and Address:  Rhetta Mura, MD  Relative Name and Phone Number:       Current Level of Care:  (ALF) Recommended Level of Care: Assisted Living Facility Prior Approval Number:    Date Approved/Denied:   PASRR Number:    Discharge Plan: Other (Comment)    Current Diagnoses: Patient Active Problem List   Diagnosis Date Noted  . Hypoglycemia 06/12/2015  . Altered mental status 05/07/2015  . History of embolic stroke 05/07/2015  . History of CVA with residual deficit 05/07/2015  . Acute encephalopathy 05/07/2015  . Essential hypertension 04/24/2014  . HLD (hyperlipidemia) 04/24/2014  . Dysphagia 04/04/2013  . Diabetes (HCC) 02/16/2013  . Callus of foot 02/16/2013    Orientation RESPIRATION BLADDER Height & Weight     Self, Time, Situation, Place  Normal Incontinent Weight: 75.4 kg (166 lb 3.6 oz) Height:   (175.3 cm)  BEHAVIORAL SYMPTOMS/MOOD NEUROLOGICAL BOWEL NUTRITION STATUS   (None )  (None ) Continent    AMBULATORY STATUS COMMUNICATION OF NEEDS Skin   Independent Verbally Normal                       Personal Care Assistance Level of Assistance  Bathing, Feeding, Dressing Bathing Assistance: Independent Feeding assistance: Independent Dressing Assistance: Independent     Functional Limitations Info  Sight, Hearing, Speech Sight Info: Adequate Hearing Info: Adequate Speech Info: Adequate    SPECIAL CARE FACTORS FREQUENCY   (None )                    Contractures Contractures Info: Not present    Additional Factors Info  Code Status, Allergies,  Isolation Precautions Code Status Info:  (Full ) Allergies Info:  (NKDA)     Isolation Precautions Info: 4.22.17 MRSA PCR pos.     Current Medications (06/13/2015):  This is the current hospital active medication list Current Facility-Administered Medications  Medication Dose Route Frequency Provider Last Rate Last Dose  . aspirin chewable tablet 81 mg  81 mg Oral Daily Esperanza Sheets, MD   81 mg at 06/13/15 0909  . atorvastatin (LIPITOR) tablet 80 mg  80 mg Oral q1800 Esperanza Sheets, MD      . baclofen (LIORESAL) tablet 10 mg  10 mg Oral QHS Esperanza Sheets, MD   10 mg at 06/12/15 2149  . cholecalciferol (VITAMIN D) tablet 2,000 Units  2,000 Units Oral Daily Esperanza Sheets, MD   2,000 Units at 06/13/15 0908  . enoxaparin (LOVENOX) injection 40 mg  40 mg Subcutaneous Q24H Esperanza Sheets, MD      . hydrALAZINE (APRESOLINE) injection 10 mg  10 mg Intravenous Q6H PRN Esperanza Sheets, MD   10 mg at 06/13/15 0524  . lisinopril (PRINIVIL,ZESTRIL) tablet 10 mg  10 mg Oral Daily Esperanza Sheets, MD   10 mg at 06/13/15 0909  . sodium chloride flush (NS) 0.9 % injection 3 mL  3 mL Intravenous Q12H Esperanza Sheets, MD   3 mL at 06/13/15 0911     Discharge Medications: Please see discharge  summary for a list of discharge medications.  Relevant Imaging Results:  Relevant Lab Results:   Additional Information SSN: [id number]  Venita LickCampbell, Jessiah Wojnar B, LCSW

## 2015-06-14 DIAGNOSIS — E162 Hypoglycemia, unspecified: Secondary | ICD-10-CM | POA: Diagnosis not present

## 2015-06-14 LAB — GLUCOSE, CAPILLARY
GLUCOSE-CAPILLARY: 192 mg/dL — AB (ref 65–99)
Glucose-Capillary: 144 mg/dL — ABNORMAL HIGH (ref 65–99)
Glucose-Capillary: 124 mg/dL — ABNORMAL HIGH (ref 65–99)

## 2015-06-14 MED ORDER — INSULIN DETEMIR 100 UNIT/ML ~~LOC~~ SOLN: 5.0000 [IU] | Freq: Every day | SUBCUTANEOUS | Status: DC

## 2015-06-14 MED ORDER — METFORMIN HCL 1000 MG PO TABS: 500.0000 mg | ORAL_TABLET | Freq: Two times a day (BID) | ORAL | Status: DC

## 2015-06-14 NOTE — Progress Notes (Signed)
Patient discharged to [facility], discharge packet faxed to ALF by social work. Patient transported via [facility] no c/o pain or shortness of breath at d/c.  Sholonda Jobst, Kae HellerMiranda Lynn, RN

## 2015-06-14 NOTE — Discharge Summary (Signed)
Physician Discharge Summary  Manuel Higgins ZOX:[id number] DOB: 05-Dec-1950 DOA: 06/12/2015  PCP: Bernerd Limbo  Admit date: 06/12/2015 Discharge date: 06/14/2015  Time spent: 35 minutes  Recommendations for Outpatient Follow-up:  1. Cut back lantus to 5 U, metformin 100 bid-->500 bid 2. Check fingers periodically-needs to quit smoking  Discharge Diagnoses:  Active Problems:   Hypoglycemia   Discharge Condition: good  Diet recommendation: diabetic  Filed Weights   06/12/15 1634  Weight: 75.4 kg (166 lb 3.6 oz)    History of present illness:  66 ? Htn Ty II DM CVA 3.20.15-multiple acute/subacute [was on POINT trial]-on asa 325 now Implanted loop recorder placed showed no A. fib Hypertension ? Multi-infarct dementia versus Alzheimer's  Has Buerger's disease but continues to smoke...  Hospital Course:    1. Severe hypoglycemia-tells me he has had similar episodes in the past month at least 1 month for a short period of time. Eating and drinking now. Hold insulin Levemir 10 units and sliding scale. Blood sugars ranging 142-155. Discontinued D5--on d/c cut back lantus to 5 u 2. Diabetes mellitus type 2-monitor trends--reimplement metformin at lower doses 500 bid on d/c to ALF 3. Previous CVA with multi-infarct dementia-monitor. Attempt to trial off baclofen in the outpatient setting-continue aspirin 81 daily 4. Hypertension-continue lisinopril 10 daily 5. Buerger;'s dx-smoker-needs to quit   Discharge Exam: Filed Vitals:   06/13/15 2138 06/14/15 0423  BP: 161/66 168/66  Pulse: 72 61  Temp: 98.2 F (36.8 C) 98.4 F (36.9 C)  Resp: 16 16    General: eomi ncat  Cardiovascular:  s1 s2 no m/r/g Respiratory: clear, no added sound  Discharge Instructions   Discharge Instructions    Diet - low sodium heart healthy    Complete by:  As directed      Increase activity slowly    Complete by:  As directed           Current Discharge Medication List    CONTINUE  these medications which have CHANGED   Details  insulin detemir (LEVEMIR) 100 UNIT/ML injection Inject 0.05 mLs (5 Units total) into the skin at bedtime. Qty: 10 mL, Refills: 11    metFORMIN (GLUCOPHAGE) 1000 MG tablet Take 0.5 tablets (500 mg total) by mouth 2 (two) times daily with a meal.      CONTINUE these medications which have NOT CHANGED   Details  aspirin 81 MG chewable tablet Chew 1 tablet (81 mg total) by mouth daily. Qty: 90 tablet, Refills: 3    atorvastatin (LIPITOR) 80 MG tablet Take 1 tablet (80 mg total) by mouth daily at 6 PM. Qty: 90 tablet, Refills: 3    baclofen (LIORESAL) 10 MG tablet Take 1 tablet (10 mg total) by mouth at bedtime. Qty: 30 each, Refills: 0    Cholecalciferol (VITAMIN D) 2000 units CAPS Take 2,000 Units by mouth daily.    insulin aspart (NOVOLOG) 100 UNIT/ML injection Inject 0-10 Units into the skin 3 (three) times daily before meals. Sliding scale. 150-200-  2 units, 201-250- 4 units. 251-300. 6 units. 301-350-8 units. 351-400- 10 units    lisinopril (PRINIVIL,ZESTRIL) 10 MG tablet Take 1 tablet (10 mg total) by mouth daily. Qty: 30 tablet, Refills: 5    naphazoline (NAPHCON) 0.1 % ophthalmic solution Place 1 drop into both eyes 4 (four) times daily as needed for irritation. Qty: 15 mL, Refills: 0       No Known Allergies    The results of significant diagnostics from this hospitalization (including  imaging, microbiology, ancillary and laboratory) are listed below for reference.    Significant Diagnostic Studies: No results found.  Microbiology: Recent Results (from the past 240 hour(s))  MRSA PCR Screening     Status: None   Collection Time: 06/12/15  5:23 PM  Result Value Ref Range Status   MRSA by PCR NEGATIVE NEGATIVE Final    Comment:        The GeneXpert MRSA Assay (FDA approved for NASAL specimens only), is one component of a comprehensive MRSA colonization surveillance program. It is not intended to diagnose  MRSA infection nor to guide or monitor treatment for MRSA infections.      Labs: Basic Metabolic Panel:  Recent Labs Lab 06/12/15 1337 06/13/15 0405  NA 144 140  K 3.4* 4.0  CL 106 106  CO2 26 28  GLUCOSE 29* 159*  BUN 22* 19  CREATININE 1.27* 1.02  CALCIUM 8.8* 8.8*   Liver Function Tests:  Recent Labs Lab 06/12/15 1337  AST 22  ALT 16*  ALKPHOS 100  BILITOT 0.5  PROT 6.6  ALBUMIN 3.7   No results for input(s): LIPASE, AMYLASE in the last 168 hours. No results for input(s): AMMONIA in the last 168 hours. CBC:  Recent Labs Lab 06/12/15 1337  WBC 9.3  NEUTROABS 6.1  HGB 11.2*  HCT 34.9*  MCV 94.8  PLT 185   Cardiac Enzymes: No results for input(s): CKTOTAL, CKMB, CKMBINDEX, TROPONINI in the last 168 hours. BNP: BNP (last 3 results) No results for input(s): BNP in the last 8760 hours.  ProBNP (last 3 results) No results for input(s): PROBNP in the last 8760 hours.  CBG:  Recent Labs Lab 06/13/15 1634 06/13/15 2024 06/14/15 0006 06/14/15 0420 06/14/15 0747  GLUCAP 231* 235* 192* 144* 124*       Signed:  Rhetta MuraSAMTANI, JAI-GURMUKH MD   [facility] 06/14/2015, 9:47 AM

## 2015-06-14 NOTE — Progress Notes (Signed)
[facility] Anticipated discharge date: 5/29 Family notified: Elease HashimotoPatricia Transportation by Sharin MonsPTAR- scheduled for 11am  CSW signing off.  Merlyn LotJenna Holoman, LCSWA Clinical Social Worker [phone]

## 2015-06-14 NOTE — NC FL2 (Signed)
[place] MEDICAID FL2 LEVEL OF CARE SCREENING TOOL     IDENTIFICATION  Patient Name: Manuel Higgins Birthdate: 21-Nov-1950 Sex: male Admission Date (Current Location): 06/12/2015  [facility] and [place] Number:  Producer, television/film/video and Address:  The [facility], [address], [place], [place] [address]      Provider Number: [id number]  Attending Physician Name and Address:  Rhetta Mura, MD  Relative Name and Phone Number:       Current Level of Care:  (ALF) Recommended Level of Care: Assisted Living Facility Prior Approval Number:    Date Approved/Denied:   PASRR Number:    Discharge Plan: Other (Comment)    Current Diagnoses: Patient Active Problem List   Diagnosis Date Noted  . Hypoglycemia 06/12/2015  . Altered mental status 05/07/2015  . History of embolic stroke 05/07/2015  . History of CVA with residual deficit 05/07/2015  . Acute encephalopathy 05/07/2015  . Essential hypertension 04/24/2014  . HLD (hyperlipidemia) 04/24/2014  . Dysphagia 04/04/2013  . Diabetes (HCC) 02/16/2013  . Callus of foot 02/16/2013    Orientation RESPIRATION BLADDER Height & Weight     Self, Time, Situation, Place  Normal Incontinent Weight: 166 lb 3.6 oz (75.4 kg) Height:   (175.3 cm)  BEHAVIORAL SYMPTOMS/MOOD NEUROLOGICAL BOWEL NUTRITION STATUS   (None )  (None ) Continent Diet (no added salt)  AMBULATORY STATUS COMMUNICATION OF NEEDS Skin   Independent Verbally Normal                       Personal Care Assistance Level of Assistance  Bathing, Feeding, Dressing Bathing Assistance: Independent Feeding assistance: Independent Dressing Assistance: Independent     Functional Limitations Info  Sight, Hearing, Speech Sight Info: Adequate Hearing Info: Adequate Speech Info: Adequate    SPECIAL CARE FACTORS FREQUENCY   (None )                    Contractures Contractures Info: Not present    Additional Factors Info  Code  Status, Allergies, Isolation Precautions Code Status Info:  (Full ) Allergies Info:  (NKDA)     Isolation Precautions Info: 4.22.17 MRSA PCR pos.       Discharge Medications: Current Discharge Medication List    CONTINUE these medications which have CHANGED   Details  insulin detemir (LEVEMIR) 100 UNIT/ML injection Inject 0.05 mLs (5 Units total) into the skin at bedtime. Qty: 10 mL, Refills: 11    metFORMIN (GLUCOPHAGE) 1000 MG tablet Take 0.5 tablets (500 mg total) by mouth 2 (two) times daily with a meal.      CONTINUE these medications which have NOT CHANGED   Details  aspirin 81 MG chewable tablet Chew 1 tablet (81 mg total) by mouth daily. Qty: 90 tablet, Refills: 3    atorvastatin (LIPITOR) 80 MG tablet Take 1 tablet (80 mg total) by mouth daily at 6 PM. Qty: 90 tablet, Refills: 3    baclofen (LIORESAL) 10 MG tablet Take 1 tablet (10 mg total) by mouth at bedtime. Qty: 30 each, Refills: 0    Cholecalciferol (VITAMIN D) 2000 units CAPS Take 2,000 Units by mouth daily.    insulin aspart (NOVOLOG) 100 UNIT/ML injection Inject 0-10 Units into the skin 3 (three) times daily before meals. Sliding scale. 150-200- 2 units, 201-250- 4 units. 251-300. 6 units. 301-350-8 units. 351-400- 10 units    lisinopril (PRINIVIL,ZESTRIL) 10 MG tablet Take 1 tablet (10 mg total) by  mouth daily. Qty: 30 tablet, Refills: 5    naphazoline (NAPHCON) 0.1 % ophthalmic solution Place 1 drop into both eyes 4 (four) times daily as needed for irritation. Qty: 15 mL, Refills: 0       .  Relevant Imaging Results:  Relevant Lab Results:   Additional Information SSN: [id number]  Izora RibasHoloman, Odilia Damico M, [place]

## 2015-06-15 LAB — HEMOGLOBIN A1C
HEMOGLOBIN A1C: 6.3 % — AB (ref 4.8–5.6)
MEAN PLASMA GLUCOSE: 134 mg/dL

## 2015-07-30 ENCOUNTER — Telehealth: Payer: Self-pay | Admitting: Cardiology

## 2015-07-30 NOTE — Telephone Encounter (Signed)
LMOVM requesting that pt send manual transmission b/c home monitor has not updated in at least 14 days.    

## 2015-08-05 ENCOUNTER — Encounter: Payer: Self-pay | Admitting: Cardiology

## 2015-08-18 ENCOUNTER — Encounter: Payer: Self-pay | Admitting: Cardiology

## 2015-09-16 ENCOUNTER — Telehealth: Payer: Self-pay | Admitting: Cardiology

## 2015-09-16 NOTE — Telephone Encounter (Signed)
Certified letter mailed and returned signed. Home monitor still not updating, un enrolled from [employer] and return kit ordered.

## 2016-02-14 ENCOUNTER — Observation Stay (HOSPITAL_COMMUNITY)
Admission: EM | Admit: 2016-02-14 | Discharge: 2016-02-15 | Disposition: A | Discharge status: Skilled Nursing Facility | Payer: Medicaid Other | Attending: Internal Medicine | Admitting: Internal Medicine

## 2016-02-14 ENCOUNTER — Observation Stay (HOSPITAL_COMMUNITY): Payer: Medicaid Other

## 2016-02-14 ENCOUNTER — Encounter (HOSPITAL_COMMUNITY): Payer: Self-pay | Admitting: Emergency Medicine

## 2016-02-14 DIAGNOSIS — E785 Hyperlipidemia, unspecified: Secondary | ICD-10-CM | POA: Insufficient documentation

## 2016-02-14 DIAGNOSIS — F0390 Unspecified dementia without behavioral disturbance: Secondary | ICD-10-CM | POA: Diagnosis not present

## 2016-02-14 DIAGNOSIS — E162 Hypoglycemia, unspecified: Secondary | ICD-10-CM | POA: Diagnosis not present

## 2016-02-14 DIAGNOSIS — Z87891 Personal history of nicotine dependence: Secondary | ICD-10-CM | POA: Insufficient documentation

## 2016-02-14 DIAGNOSIS — E119 Type 2 diabetes mellitus without complications: Secondary | ICD-10-CM

## 2016-02-14 DIAGNOSIS — T383X1A Poisoning by insulin and oral hypoglycemic [antidiabetic] drugs, accidental (unintentional), initial encounter: Secondary | ICD-10-CM | POA: Diagnosis not present

## 2016-02-14 DIAGNOSIS — E160 Drug-induced hypoglycemia without coma: Secondary | ICD-10-CM | POA: Diagnosis present

## 2016-02-14 DIAGNOSIS — E1159 Type 2 diabetes mellitus with other circulatory complications: Secondary | ICD-10-CM

## 2016-02-14 DIAGNOSIS — Z79899 Other long term (current) drug therapy: Secondary | ICD-10-CM | POA: Diagnosis not present

## 2016-02-14 DIAGNOSIS — Z794 Long term (current) use of insulin: Secondary | ICD-10-CM | POA: Insufficient documentation

## 2016-02-14 DIAGNOSIS — D72829 Elevated white blood cell count, unspecified: Secondary | ICD-10-CM | POA: Diagnosis present

## 2016-02-14 DIAGNOSIS — I10 Essential (primary) hypertension: Secondary | ICD-10-CM | POA: Diagnosis not present

## 2016-02-14 DIAGNOSIS — E876 Hypokalemia: Secondary | ICD-10-CM | POA: Diagnosis not present

## 2016-02-14 DIAGNOSIS — I6930 Unspecified sequelae of cerebral infarction: Secondary | ICD-10-CM

## 2016-02-14 DIAGNOSIS — T383X5A Adverse effect of insulin and oral hypoglycemic [antidiabetic] drugs, initial encounter: Secondary | ICD-10-CM

## 2016-02-14 DIAGNOSIS — Z7982 Long term (current) use of aspirin: Secondary | ICD-10-CM | POA: Diagnosis not present

## 2016-02-14 DIAGNOSIS — E11649 Type 2 diabetes mellitus with hypoglycemia without coma: Secondary | ICD-10-CM | POA: Insufficient documentation

## 2016-02-14 DIAGNOSIS — G9340 Encephalopathy, unspecified: Secondary | ICD-10-CM | POA: Insufficient documentation

## 2016-02-14 LAB — CBC WITH DIFFERENTIAL/PLATELET
Basophils Absolute: 0 10*3/uL (ref 0.0–0.1)
Basophils Relative: 0 %
EOS ABS: 0 10*3/uL (ref 0.0–0.7)
Eosinophils Relative: 0 %
HEMATOCRIT: 30.7 % — AB (ref 39.0–52.0)
HEMOGLOBIN: 10 g/dL — AB (ref 13.0–17.0)
LYMPHS ABS: 0.7 10*3/uL (ref 0.7–4.0)
LYMPHS PCT: 5 %
MCH: 30.5 pg (ref 26.0–34.0)
MCHC: 32.6 g/dL (ref 30.0–36.0)
MCV: 93.6 fL (ref 78.0–100.0)
MONOS PCT: 6 %
Monocytes Absolute: 0.9 10*3/uL (ref 0.1–1.0)
NEUTROS ABS: 14.5 10*3/uL — AB (ref 1.7–7.7)
NEUTROS PCT: 89 %
Platelets: 200 10*3/uL (ref 150–400)
RBC: 3.28 MIL/uL — AB (ref 4.22–5.81)
RDW: 13.4 % (ref 11.5–15.5)
WBC: 16.2 10*3/uL — AB (ref 4.0–10.5)

## 2016-02-14 LAB — URINALYSIS, ROUTINE W REFLEX MICROSCOPIC
BACTERIA UA: NONE SEEN
BILIRUBIN URINE: NEGATIVE
Glucose, UA: 50 mg/dL — AB
HGB URINE DIPSTICK: NEGATIVE
KETONES UR: NEGATIVE mg/dL
LEUKOCYTES UA: NEGATIVE
Nitrite: NEGATIVE
PROTEIN: 100 mg/dL — AB
Specific Gravity, Urine: 1.016 (ref 1.005–1.030)
pH: 6 (ref 5.0–8.0)

## 2016-02-14 LAB — BASIC METABOLIC PANEL
Anion gap: 9 (ref 5–15)
BUN: 26 mg/dL — AB (ref 6–20)
CHLORIDE: 107 mmol/L (ref 101–111)
CO2: 25 mmol/L (ref 22–32)
CREATININE: 1.31 mg/dL — AB (ref 0.61–1.24)
Calcium: 8.7 mg/dL — ABNORMAL LOW (ref 8.9–10.3)
GFR calc Af Amer: >60 mL/min (ref >60)
GFR calc non Af Amer: 56 mL/min — ABNORMAL LOW (ref >60)
GLUCOSE: 36 mg/dL — AB (ref 65–99)
POTASSIUM: 3.4 mmol/L — AB (ref 3.5–5.1)
Sodium: 141 mmol/L (ref 135–145)

## 2016-02-14 LAB — CBG MONITORING, ED
GLUCOSE-CAPILLARY: 17 mg/dL — AB (ref 65–99)
GLUCOSE-CAPILLARY: 125 mg/dL — AB (ref 65–99)
Glucose-Capillary: 88 mg/dL (ref 65–99)
Glucose-Capillary: 115 mg/dL — ABNORMAL HIGH (ref 65–99)
Glucose-Capillary: 127 mg/dL — ABNORMAL HIGH (ref 65–99)
Glucose-Capillary: 137 mg/dL — ABNORMAL HIGH (ref 65–99)

## 2016-02-14 LAB — GLUCOSE, CAPILLARY: Glucose-Capillary: 260 mg/dL — ABNORMAL HIGH (ref 65–99)

## 2016-02-14 MED ORDER — DEXTROSE-NACL 5-0.45 % IV SOLN
INTRAVENOUS | Status: DC
  Administered 2016-02-14: 20:00:00 via INTRAVENOUS

## 2016-02-14 MED ORDER — MEMANTINE HCL 10 MG PO TABS
5.0000 mg | ORAL_TABLET | Freq: Two times a day (BID) | ORAL | Status: DC
  Administered 2016-02-14 – 2016-02-15 (×2): 5 mg via ORAL
  Filled 2016-02-14 (×2): qty 1

## 2016-02-14 MED ORDER — LISINOPRIL 10 MG PO TABS
10.0000 mg | ORAL_TABLET | Freq: Every day | ORAL | Status: DC
  Administered 2016-02-15: 10 mg via ORAL
  Filled 2016-02-14: qty 1

## 2016-02-14 MED ORDER — DEXTROSE-NACL 5-0.45 % IV SOLN
INTRAVENOUS | Status: AC
  Administered 2016-02-15: via INTRAVENOUS

## 2016-02-14 MED ORDER — INSULIN ASPART 100 UNIT/ML ~~LOC~~ SOLN
0.0000 [IU] | SUBCUTANEOUS | Status: DC
  Administered 2016-02-15: 3 [IU] via SUBCUTANEOUS
  Administered 2016-02-15: 5 [IU] via SUBCUTANEOUS

## 2016-02-14 MED ORDER — DONEPEZIL HCL 10 MG PO TABS
10.0000 mg | ORAL_TABLET | Freq: Every day | ORAL | Status: DC
  Administered 2016-02-14: 10 mg via ORAL

## 2016-02-14 MED ORDER — DEXTROSE 50 % IV SOLN
INTRAVENOUS | Status: AC
  Filled 2016-02-14: qty 50

## 2016-02-14 MED ORDER — ACETAMINOPHEN 650 MG RE SUPP: 650.0000 mg | Freq: Four times a day (QID) | RECTAL | Status: DC | PRN

## 2016-02-14 MED ORDER — DIVALPROEX SODIUM 125 MG PO CSDR
125.0000 mg | DELAYED_RELEASE_CAPSULE | Freq: Two times a day (BID) | ORAL | Status: DC
  Administered 2016-02-14 – 2016-02-15 (×2): 125 mg via ORAL
  Filled 2016-02-14: qty 1

## 2016-02-14 MED ORDER — POTASSIUM CHLORIDE CRYS ER 20 MEQ PO TBCR
40.0000 meq | EXTENDED_RELEASE_TABLET | Freq: Once | ORAL | Status: AC
  Administered 2016-02-14: 40 meq via ORAL
  Filled 2016-02-14: qty 2

## 2016-02-14 MED ORDER — ONDANSETRON HCL 4 MG/2ML IJ SOLN: 4.0000 mg | Freq: Four times a day (QID) | INTRAMUSCULAR | Status: DC | PRN

## 2016-02-14 MED ORDER — ACETAMINOPHEN 325 MG PO TABS: 650.0000 mg | ORAL_TABLET | Freq: Four times a day (QID) | ORAL | Status: DC | PRN

## 2016-02-14 MED ORDER — ENOXAPARIN SODIUM 40 MG/0.4ML ~~LOC~~ SOLN
40.0000 mg | Freq: Every day | SUBCUTANEOUS | Status: DC
  Administered 2016-02-14: 40 mg via SUBCUTANEOUS
  Filled 2016-02-14: qty 0.4

## 2016-02-14 MED ORDER — ENOXAPARIN SODIUM 30 MG/0.3ML ~~LOC~~ SOLN: 30.0000 mg | SUBCUTANEOUS | Status: DC

## 2016-02-14 MED ORDER — HYDRALAZINE HCL 20 MG/ML IJ SOLN
10.0000 mg | INTRAMUSCULAR | Status: DC | PRN
Start: 2016-02-14 — End: 2016-02-15
  Administered 2016-02-14: 10 mg via INTRAVENOUS
  Filled 2016-02-14: qty 1

## 2016-02-14 MED ORDER — HYDROCODONE-ACETAMINOPHEN 5-325 MG PO TABS: 1.0000 | ORAL_TABLET | ORAL | Status: DC | PRN

## 2016-02-14 MED ORDER — FLUOXETINE HCL 20 MG PO CAPS
40.0000 mg | ORAL_CAPSULE | Freq: Every day | ORAL | Status: DC
  Administered 2016-02-15: 40 mg via ORAL
  Filled 2016-02-14: qty 2

## 2016-02-14 MED ORDER — DEXTROSE 50 % IV SOLN
1.0000 | Freq: Once | INTRAVENOUS | Status: AC
  Administered 2016-02-14: 50 mL via INTRAVENOUS

## 2016-02-14 MED ORDER — ASPIRIN 81 MG PO CHEW
81.0000 mg | CHEWABLE_TABLET | Freq: Every day | ORAL | Status: DC
  Administered 2016-02-15: 81 mg via ORAL
  Filled 2016-02-14: qty 1

## 2016-02-14 MED ORDER — ONDANSETRON HCL 4 MG PO TABS: 4.0000 mg | ORAL_TABLET | Freq: Four times a day (QID) | ORAL | Status: DC | PRN

## 2016-02-14 NOTE — ED Notes (Signed)
CBG obtained by ED Tech of 18 Dr.Cook notified and Dextrose 50% ordered. Pt diaphoretic on exam and after administration of Dextrose pt responding to verbal command.

## 2016-02-14 NOTE — H&P (Signed)
Manuel PanderDarrell Osterhout ZOX:[id number] DOB: 11/14/1950 DOA: 02/14/2016     PCP: Bernerd LimboMITCHELL, [name]   Outpatient Specialists: Dr. Ladona Ridgelaylor cardiology   [facility]  Chief Complaint: Hypoglycemia  HPI: Manuel Higgins is a 66 y.o. male with medical history significant of HTN, CVA, Dementia, DM   Presented with today patient was found to have CBG down to 15 he was slightly confused but has dementia at baseline unable to provide his own history. He was noted in emergency department that perirectal patient was given 100 units of NovoLog it is unclear if that is true. Patient was started on D10 by EMS enroute. Blood sugar improved to 123. Nursing home staff is unsure if patient have had any lunch or her heat and anything today at all given history of dementia unable to provide any history. Regarding pertinent Chronic problems: Patient has known history of CVA diabetes mellitus maintained on Levemir and metformin and sliding scale patient had been admitted in May 2017 for hypoglycemia. She has also been history of dementia. Patient has cryptogenic CVA implant the low recorder  Code showed no A. fib IN ER:  No data recorded.     RR 21 97% HR 70 BP 220/83 on arrival 189/70 While in emergency department blood sugar dropped to 17 he was given D50 and blood sugar now improved to 125  WBC 16.2 hemoglobin 10 K 3.4 Cr 1.31 close to baseline Following Medications were ordered in ER: Medications  dextrose 5 %-0.45 % sodium chloride infusion ( Intravenous New Bag/Given 02/14/16 1948)  dextrose 50 % solution 50 mL ( Intravenous Not Given 02/14/16 1958)      Hospitalist was called for admission for Hypoglycemia  Review of Systems:    Pertinent positives include: Confusion  Constitutional:  No weight loss, night sweats, Fevers, chills, fatigue, weight loss  HEENT:  No headaches, Difficulty swallowing,Tooth/dental problems,Sore throat,  No sneezing, itching, ear ache, nasal  congestion, post nasal drip,  Cardio-vascular:  No chest pain, Orthopnea, PND, anasarca, dizziness, palpitations.no Bilateral lower extremity swelling  GI:  No heartburn, indigestion, abdominal pain, nausea, vomiting, diarrhea, change in bowel habits, loss of appetite, melena, blood in stool, hematemesis Resp:  no shortness of breath at rest. No dyspnea on exertion, No excess mucus, no productive cough, No non-productive cough, No coughing up of blood.No change in color of mucus.No wheezing. Skin:  no rash or lesions. No jaundice GU:  no dysuria, change in color of urine, no urgency or frequency. No straining to urinate.  No flank pain.  Musculoskeletal:  No joint pain or no joint swelling. No decreased range of motion. No back pain.  Psych:  No change in mood or affect. No depression or anxiety. No memory loss.  Neuro: no localizing neurological complaints, no tingling, no weakness, no double vision, no gait abnormality, no slurred speech, no confusion  As per HPI otherwise 10 point review of systems negative.   Past Medical History: Past Medical History:  Diagnosis Date  . Anemia   . Dementia    unspecified w/o behavioral disturbance  . Diabetes mellitus without complication (HCC)   . Dysphagia   . Hyperlipidemia   . Hypertension   . Hypertensive crisis 02/15/2013  . Stroke [facility](HCC) 03/2013   Multiple infarcts  . Toe ulcer (HCC)    Past Surgical History:  Procedure Laterality Date  . BACK SURGERY    . LOOP RECORDER IMPLANT  04/08/2013   MDT LinQ implanted by Dr Ladona Ridgelaylor for  cryptogenic stroke  . LOOP RECORDER IMPLANT N/A 04/08/2013   Procedure: LOOP RECORDER IMPLANT;  Surgeon: Marinus Maw, MD;  Location: [facility] CATH LAB;  Service: Cardiovascular;  Laterality: N/A;  . TEE WITHOUT CARDIOVERSION N/A 04/07/2013   Procedure: TRANSESOPHAGEAL ECHOCARDIOGRAM (TEE);  Surgeon: Lars Masson, MD;  Location: [facility] ENDOSCOPY;  Service: Cardiovascular;  Laterality: N/A;  . TONSILLECTOMY        Social History:      reports that he quit smoking about 12 years ago. His smoking use included Cigarettes. He has a 3.30 pack-year smoking history. He has never used smokeless tobacco. He reports that he does not drink alcohol or use drugs.  Allergies:   Allergies  Allergen Reactions  . Risperdal [Risperidone]     Unknown reaction per [facility]        Family History: Unable to obtain patient unable to provide history Family History  Problem Relation Age of Onset  . Family history unknown: Yes    Medications: Prior to Admission medications   Medication Sig Start Date End Date Taking? Authorizing Provider  acetaminophen (TYLENOL) 500 MG tablet Take 500 mg by mouth every 4 (four) hours as needed for mild pain, moderate pain, fever or headache.   Yes Historical Provider, MD  alum & mag hydroxide-simeth (ALMACONE) 200-200-20 MG/5ML suspension Take 30 mLs by mouth as needed for indigestion or heartburn.   Yes Historical Provider, MD  aspirin 81 MG chewable tablet Chew 1 tablet (81 mg total) by mouth daily. Patient taking differently: Chew 81 mg by mouth daily with breakfast.  05/10/15  Yes Selina Cooley, MD  atorvastatin (LIPITOR) 80 MG tablet Take 1 tablet (80 mg total) by mouth daily at 6 PM. 05/10/15  Yes Selina Cooley, MD  baclofen (LIORESAL) 10 MG tablet Take 1 tablet (10 mg total) by mouth at bedtime. 05/10/15  Yes Selina Cooley, MD  Cholecalciferol (VITAMIN D) 2000 units CAPS Take 2,000 Units by mouth daily with breakfast.    Yes Historical Provider, MD  divalproex (DEPAKOTE SPRINKLE) 125 MG capsule Take 125 mg by mouth 2 (two) times daily.   Yes Historical Provider, MD  donepezil (ARICEPT) 10 MG tablet Take 10 mg by mouth at bedtime.   Yes Historical Provider, MD  FLUoxetine (PROZAC) 40 MG capsule Take 40 mg by mouth daily with breakfast.   Yes Historical Provider, MD  guaifenesin (ROBITUSSIN) 100 MG/5ML syrup Take 200 mg by mouth every 6 (six) hours as needed for cough.   Yes  Historical Provider, MD  insulin aspart (NOVOLOG FLEXPEN) 100 UNIT/ML FlexPen Inject 2-12 Units into the skin 3 (three) times daily with meals. Per sliding scale  150 - 200 = 2 units 201 - 250 = 4 units  251 - 300 = 6 units  301 - 350 = 8 units  351 - 400 = 10 units > 401 = 12 units and Call MD   Yes Historical Provider, MD  Insulin Detemir (LEVEMIR FLEXPEN) 100 UNIT/ML Pen Inject 5 Units into the skin at bedtime.   Yes Historical Provider, MD  lisinopril (PRINIVIL,ZESTRIL) 10 MG tablet Take 1 tablet (10 mg total) by mouth daily. Patient taking differently: Take 10 mg by mouth daily with breakfast.  04/09/13  Yes Ripudeep K Rai, MD  loperamide (IMODIUM A-D) 2 MG tablet Take 2 mg by mouth as needed for diarrhea or loose stools.   Yes Historical Provider, MD  magnesium hydroxide (MILK OF MAGNESIA) 400 MG/5ML suspension Take 30 mLs by mouth at bedtime as  needed for mild constipation.   Yes Historical Provider, MD  memantine (NAMENDA) 5 MG tablet Take 5 mg by mouth 2 (two) times daily.   Yes Historical Provider, MD  metFORMIN (GLUCOPHAGE) 500 MG tablet Take 500 mg by mouth 2 (two) times daily with a meal.   Yes Historical Provider, MD  neomycin-bacitracin-polymyxin (NEOSPORIN) ointment Apply 1 application topically as needed for wound care.   Yes Historical Provider, MD  insulin detemir (LEVEMIR) 100 UNIT/ML injection Inject 0.05 mLs (5 Units total) into the skin at bedtime. Patient not taking: Reported on 02/14/2016 06/14/15   Rhetta Mura, MD  metFORMIN (GLUCOPHAGE) 1000 MG tablet Take 0.5 tablets (500 mg total) by mouth 2 (two) times daily with a meal. Patient not taking: Reported on 02/14/2016 06/14/15   Rhetta Mura, MD    Physical Exam: Patient Vitals for the past 24 hrs:  BP Pulse Resp SpO2 Height Weight  02/14/16 2034 (!) 220/83 70 21 97 % - -  02/14/16 1949 161/73 73 13 97 % - -  02/14/16 1932 - - - - 5\' 11"  (1.803 m) 72.6 kg (160 lb)  02/14/16 1830 189/70 70 18 98 % - -     1. General:  in No Acute distress 2. Psychological: Alert and   Oriented 3. Head/ENT:     Dry Mucous Membranes                          Head Non traumatic, neck supple                           Poor Dentition 4. SKIN:   decreased Skin turgor,  Skin clean Dry and intact no rash 5. Heart: Regular rate and rhythm no Murmur, Rub or gallop 6. Lungs:  Clear to auscultation bilaterally, no wheezes or crackles   7. Abdomen: Soft,  non-tender, Non distended 8. Lower extremities: no clubbing, cyanosis, or edema 9. Neurologically Grossly diminished not following instructions for close neurological examination 10. MSK: Normal range of motion   body mass index is 22.32 kg/m.  Labs on Admission:   Labs on Admission: I have personally reviewed following labs and imaging studies  CBC:  Recent Labs Lab 02/14/16 1913  WBC 16.2*  NEUTROABS 14.5*  HGB 10.0*  HCT 30.7*  MCV 93.6  PLT 200   Basic Metabolic Panel:  Recent Labs Lab 02/14/16 1913  NA 141  K 3.4*  CL 107  CO2 25  GLUCOSE 36*  BUN 26*  CREATININE 1.31*  CALCIUM 8.7*   GFR: Estimated Creatinine Clearance: 58.5 mL/min (by C-G formula based on SCr of 1.31 mg/dL (H)). Liver Function Tests: No results for input(s): AST, ALT, ALKPHOS, BILITOT, PROT, ALBUMIN in the last 168 hours. No results for input(s): LIPASE, AMYLASE in the last 168 hours. No results for input(s): AMMONIA in the last 168 hours. Coagulation Profile: No results for input(s): INR, PROTIME in the last 168 hours. Cardiac Enzymes: No results for input(s): CKTOTAL, CKMB, CKMBINDEX, TROPONINI in the last 168 hours. BNP (last 3 results) No results for input(s): PROBNP in the last 8760 hours. HbA1C: No results for input(s): HGBA1C in the last 72 hours. CBG:  Recent Labs Lab 02/14/16 1831 02/14/16 1925 02/14/16 1948 02/14/16 2021  GLUCAP 88 17* 125* 115*   Lipid Profile: No results for input(s): CHOL, HDL, LDLCALC, TRIG, CHOLHDL, LDLDIRECT in the  last 72 hours. Thyroid Function Tests: No results for  input(s): TSH, T4TOTAL, FREET4, T3FREE, THYROIDAB in the last 72 hours. Anemia Panel: No results for input(s): VITAMINB12, FOLATE, FERRITIN, TIBC, IRON, RETICCTPCT in the last 72 hours.   @LABRCNTIP (procalcitonin:4,lacticidven:4) )No results found for this or any previous visit (from the past 240 hour(s)).     UA  ordered  Lab Results  Component Value Date   HGBA1C 6.3 (H) 06/12/2015    Estimated Creatinine Clearance: 58.5 mL/min (by C-G formula based on SCr of 1.31 mg/dL (H)).  BNP (last 3 results) No results for input(s): PROBNP in the last 8760 hours.   ECG REPORT  Independently reviewed Rate: 77  Rhythm: SR ST&T Change: LVH with repol abnormality QTC 474  Filed Weights   02/14/16 1932  Weight: 72.6 kg (160 lb)     Cultures:    Component Value Date/Time   SDES BLOOD LEFT HAND 05/07/2015 1545   SPECREQUEST BOTTLES DRAWN AEROBIC AND ANAEROBIC 5CC 05/07/2015 1545   CULT NO GROWTH 5 DAYS 05/07/2015 1545   REPTSTATUS 05/12/2015 FINAL 05/07/2015 1545     Radiological Exams on Admission: No results found.  Chart has been reviewed    Assessment/Plan   66 y.o. male with medical history significant of HTN, CVA, Dementia, DM admitted for observation for hypoglycemia in a setting of possible unintentional insulin overdose  Present on Admission: . Acute encephalopathy - in the setting of hypoglycemia currently coming back to baseline . Essential hypertension evidence of accelerated hypertension we will restart home medications give hydralazine when necessary . HLD (hyperlipidemia) stable continue home medications . Hypoglycemia possibly sent secondary to unintentional insulin overdose record states he received 100 units of NovoLog. Continue careful monitoring frequent CBG, currently D5 half-normal continue for next 5 hours and then wean off and follow electrolytes . Hypokalemia - Replace and check magnesium .  Leukocytosis obtain UA and monitor for any evidence of underlying infection   Other plan as per orders.  DVT prophylaxis:   Lovenox     Code Status:  FULL CODE  as per prior records   Family Communication:   Family not  at  Bedside    Disposition Plan:                             Back to current facility when stable                                         Social Work   Diabetes coordinator  Nutrition   consulted                          Consults called: none  Admission status:    obs   Level of care        medical floor        I have spent a total of 56 min on this admission    Lacoya Wilbanks 02/14/2016, 9:40 PM    [facility]  Pager [phone]   after 2 AM please page floor coverage PA If 7AM-7PM, please contact the day team taking care of the patient  [email or web address]  Password [email or web address]

## 2016-02-14 NOTE — Progress Notes (Signed)
Rx Brief note:  Lovenox  Wt=72 kg, CrCl~58 ml/min Rx adjusted Lovenox to 40 mg daily  Thanks Lorenza EvangelistGreen, Miral Hoopes R 02/14/2016 10:40 PM

## 2016-02-14 NOTE — ED Notes (Signed)
Dr. Doutova at bedside.  

## 2016-02-14 NOTE — ED Triage Notes (Signed)
[facility] EMS [facility] CBg 15. 22g in left forearm, patient given D10% 250ml  via IV in route. Patient returned to his baseline CBG 123.  Per Facility patient was given insulin dose but unsure if he ate lunch or not and when he was last seen.

## 2016-02-14 NOTE — ED Notes (Signed)
Dr.Cook to be notified of pt BP. Pt is on medications for BP according to nursing home records. Pt alert and oriented to person, place, and situation. Disoriented to time.

## 2016-02-14 NOTE — ED Notes (Signed)
Patient transported to X-ray 

## 2016-02-14 NOTE — ED Provider Notes (Signed)
[facility]-EMERGENCY DEPT Provider Note   CSN: [id number] Arrival date & time: 02/14/16  1813     History   Chief Complaint Chief Complaint  Patient presents with  . Hypoglycemia    HPI Manuel Higgins is a 66 y.o. male.  Level V caveat for hypoglycemia and dementia. Patient arrived via EMS with a glucose of 18. D50 was ordered. Patient was initially unresponsive. He improved after the glucose bolus. However glucose became low again and patient required a second D50 bolus. Nurse reports that there was Novolog insulin "100 units" given earlier today at 1130.  Uncertain if patient ate today. No family in room.      Past Medical History:  Diagnosis Date  . Anemia   . Dementia    unspecified w/o behavioral disturbance  . Diabetes mellitus without complication (HCC)   . Dysphagia   . Hyperlipidemia   . Hypertension   . Hypertensive crisis 02/15/2013  . Stroke [facility]) 03/2013   Multiple infarcts  . Toe ulcer [facility])     Patient Active Problem List   Diagnosis Date Noted  . Hypoglycemia 06/12/2015  . Altered mental status 05/07/2015  . History of embolic stroke 05/07/2015  . History of CVA with residual deficit 05/07/2015  . Acute encephalopathy 05/07/2015  . Essential hypertension 04/24/2014  . HLD (hyperlipidemia) 04/24/2014  . Dysphagia 04/04/2013  . Diabetes (HCC) 02/16/2013  . Callus of foot 02/16/2013    Past Surgical History:  Procedure Laterality Date  . BACK SURGERY    . LOOP RECORDER IMPLANT  04/08/2013   MDT LinQ implanted by Dr Ladona Ridgel for cryptogenic stroke  . LOOP RECORDER IMPLANT N/A 04/08/2013   Procedure: LOOP RECORDER IMPLANT;  Surgeon: Marinus Maw, MD;  Location: [facility] CATH LAB;  Service: Cardiovascular;  Laterality: N/A;  . TEE WITHOUT CARDIOVERSION N/A 04/07/2013   Procedure: TRANSESOPHAGEAL ECHOCARDIOGRAM (TEE);  Surgeon: Lars Masson, MD;  Location: [facility] ENDOSCOPY;  Service: Cardiovascular;  Laterality: N/A;  . TONSILLECTOMY         Home  Medications    Prior to Admission medications   Medication Sig Start Date End Date Taking? Authorizing Provider  acetaminophen (TYLENOL) 500 MG tablet Take 500 mg by mouth every 4 (four) hours as needed for mild pain, moderate pain, fever or headache.   Yes Historical Provider, MD  alum & mag hydroxide-simeth (ALMACONE) 200-200-20 MG/5ML suspension Take 30 mLs by mouth as needed for indigestion or heartburn.   Yes Historical Provider, MD  aspirin 81 MG chewable tablet Chew 1 tablet (81 mg total) by mouth daily. Patient taking differently: Chew 81 mg by mouth daily with breakfast.  05/10/15  Yes Selina Cooley, MD  atorvastatin (LIPITOR) 80 MG tablet Take 1 tablet (80 mg total) by mouth daily at 6 PM. 05/10/15  Yes Selina Cooley, MD  baclofen (LIORESAL) 10 MG tablet Take 1 tablet (10 mg total) by mouth at bedtime. 05/10/15  Yes Selina Cooley, MD  Cholecalciferol (VITAMIN D) 2000 units CAPS Take 2,000 Units by mouth daily with breakfast.    Yes Historical Provider, MD  divalproex (DEPAKOTE SPRINKLE) 125 MG capsule Take 125 mg by mouth 2 (two) times daily.   Yes Historical Provider, MD  donepezil (ARICEPT) 10 MG tablet Take 10 mg by mouth at bedtime.   Yes Historical Provider, MD  FLUoxetine (PROZAC) 40 MG capsule Take 40 mg by mouth daily with breakfast.   Yes Historical Provider, MD  guaifenesin (ROBITUSSIN) 100 MG/5ML syrup Take 200 mg by mouth every 6 (  six) hours as needed for cough.   Yes Historical Provider, MD  insulin aspart (NOVOLOG FLEXPEN) 100 UNIT/ML FlexPen Inject 2-12 Units into the skin 3 (three) times daily with meals. Per sliding scale  150 - 200 = 2 units 201 - 250 = 4 units  251 - 300 = 6 units  301 - 350 = 8 units  351 - 400 = 10 units > 401 = 12 units and Call MD   Yes Historical Provider, MD  Insulin Detemir (LEVEMIR FLEXPEN) 100 UNIT/ML Pen Inject 5 Units into the skin at bedtime.   Yes Historical Provider, MD  lisinopril (PRINIVIL,ZESTRIL) 10 MG tablet Take 1 tablet (10 mg total) by  mouth daily. Patient taking differently: Take 10 mg by mouth daily with breakfast.  04/09/13  Yes Ripudeep K Rai, MD  loperamide (IMODIUM A-D) 2 MG tablet Take 2 mg by mouth as needed for diarrhea or loose stools.   Yes Historical Provider, MD  magnesium hydroxide (MILK OF MAGNESIA) 400 MG/5ML suspension Take 30 mLs by mouth at bedtime as needed for mild constipation.   Yes Historical Provider, MD  memantine (NAMENDA) 5 MG tablet Take 5 mg by mouth 2 (two) times daily.   Yes Historical Provider, MD  metFORMIN (GLUCOPHAGE) 500 MG tablet Take 500 mg by mouth 2 (two) times daily with a meal.   Yes Historical Provider, MD  neomycin-bacitracin-polymyxin (NEOSPORIN) ointment Apply 1 application topically as needed for wound care.   Yes Historical Provider, MD  insulin detemir (LEVEMIR) 100 UNIT/ML injection Inject 0.05 mLs (5 Units total) into the skin at bedtime. Patient not taking: Reported on 02/14/2016 06/14/15   Rhetta MuraJai-Gurmukh Samtani, MD  metFORMIN (GLUCOPHAGE) 1000 MG tablet Take 0.5 tablets (500 mg total) by mouth 2 (two) times daily with a meal. Patient not taking: Reported on 02/14/2016 06/14/15   Rhetta MuraJai-Gurmukh Samtani, MD    Family History Family History  Problem Relation Age of Onset  . Family history unknown: Yes    Social History Social History  Substance Use Topics  . Smoking status: Former Smoker    Packs/day: 0.33    Years: 10.00    Types: Cigarettes    Quit date: 01/17/2004  . Smokeless tobacco: Never Used  . Alcohol use No     Comment: Denies Currently but previously marked as yes     Allergies   Risperdal [risperidone]   Review of Systems Review of Systems  Reason unable to perform ROS: Dementia.     Physical Exam Updated Vital Signs BP 161/73 (BP Location: Left Arm)   Pulse 73   Resp 13   Ht 5\' 11"  (1.803 m)   Wt 160 lb (72.6 kg)   SpO2 97%   BMI 22.32 kg/m   Physical Exam  Constitutional: He appears well-developed and well-nourished.  HENT:  Head:  Normocephalic and atraumatic.  Eyes: Conjunctivae are normal.  Neck: Neck supple.  Cardiovascular: Normal rate and regular rhythm.   Pulmonary/Chest: Effort normal and breath sounds normal.  Abdominal: Soft. Bowel sounds are normal.  Musculoskeletal: Normal range of motion.  Neurological:  Unable  Skin: Skin is warm and dry.  Psychiatric:  Demented  Nursing note and vitals reviewed.    ED Treatments / Results  Labs (all labs ordered are listed, but only abnormal results are displayed) Labs Reviewed  CBC WITH DIFFERENTIAL/PLATELET - Abnormal; Notable for the following:       Result Value   WBC 16.2 (*)    RBC 3.28 (*)  Hemoglobin 10.0 (*)    HCT 30.7 (*)    Neutro Abs 14.5 (*)    All other components within normal limits  BASIC METABOLIC PANEL - Abnormal; Notable for the following:    Potassium 3.4 (*)    Glucose, Bld 36 (*)    BUN 26 (*)    Creatinine, Ser 1.31 (*)    Calcium 8.7 (*)    GFR calc non Af Amer 56 (*)    All other components within normal limits  CBG MONITORING, ED - Abnormal; Notable for the following:    Glucose-Capillary 17 (*)    All other components within normal limits  CBG MONITORING, ED - Abnormal; Notable for the following:    Glucose-Capillary 125 (*)    All other components within normal limits  CBG MONITORING, ED - Abnormal; Notable for the following:    Glucose-Capillary 115 (*)    All other components within normal limits  URINALYSIS, ROUTINE W REFLEX MICROSCOPIC  CBG MONITORING, ED    EKG  EKG Interpretation  Date/Time:  Monday February 14 2016 19:28:46 EST Ventricular Rate:  68 PR Interval:    QRS Duration: 105 QT Interval:  445 QTC Calculation: 474 R Axis:   60 Text Interpretation:  Sinus rhythm Probable left atrial enlargement LVH with secondary repolarization abnormality Anterior ST elevation, probably due to LVH Confirmed by Cleo Santucci  MD, Killian Ress ([id number]) on 02/14/2016 8:16:53 PM       Radiology No results  found.  Procedures Procedures (including critical care time)  Medications Ordered in ED Medications  dextrose 5 %-0.45 % sodium chloride infusion ( Intravenous New Bag/Given 02/14/16 1948)  dextrose 50 % solution 50 mL ( Intravenous Not Given 02/14/16 1958)     Initial Impression / Assessment and Plan / ED Course  I have reviewed the triage vital signs and the nursing notes.  Pertinent labs & imaging results that were available during my care of the patient were reviewed by me and considered in my medical decision making (see chart for details).     Patient is hypoglycemic. Suspect excessive insulin administered. Will consult hospitalist.  Final Clinical Impressions(s) / ED Diagnoses   Final diagnoses:  Hypoglycemia    New Prescriptions New Prescriptions   No medications on file     Donnetta Hutching, MD 02/14/16 2101

## 2016-02-14 NOTE — ED Notes (Signed)
Bed: [id number] Expected date:  Expected time:  Means of arrival:  Comments: EMS- 60s, hypoglycemia

## 2016-02-15 DIAGNOSIS — E876 Hypokalemia: Secondary | ICD-10-CM

## 2016-02-15 DIAGNOSIS — E162 Hypoglycemia, unspecified: Secondary | ICD-10-CM | POA: Diagnosis not present

## 2016-02-15 DIAGNOSIS — I10 Essential (primary) hypertension: Secondary | ICD-10-CM | POA: Diagnosis not present

## 2016-02-15 DIAGNOSIS — E7800 Pure hypercholesterolemia, unspecified: Secondary | ICD-10-CM

## 2016-02-15 DIAGNOSIS — E11649 Type 2 diabetes mellitus with hypoglycemia without coma: Secondary | ICD-10-CM

## 2016-02-15 DIAGNOSIS — G9340 Encephalopathy, unspecified: Secondary | ICD-10-CM

## 2016-02-15 DIAGNOSIS — I6930 Unspecified sequelae of cerebral infarction: Secondary | ICD-10-CM

## 2016-02-15 DIAGNOSIS — D72825 Bandemia: Secondary | ICD-10-CM

## 2016-02-15 LAB — COMPREHENSIVE METABOLIC PANEL
ALBUMIN: 3.6 g/dL (ref 3.5–5.0)
ALK PHOS: 89 U/L (ref 38–126)
ALT: 14 U/L — ABNORMAL LOW (ref 17–63)
AST: 20 U/L (ref 15–41)
Anion gap: 7 (ref 5–15)
BILIRUBIN TOTAL: 0.4 mg/dL (ref 0.3–1.2)
BUN: 23 mg/dL — AB (ref 6–20)
CO2: 28 mmol/L (ref 22–32)
Calcium: 8.8 mg/dL — ABNORMAL LOW (ref 8.9–10.3)
Chloride: 104 mmol/L (ref 101–111)
Creatinine, Ser: 1.08 mg/dL (ref 0.61–1.24)
GFR calc Af Amer: >60 mL/min (ref >60)
GFR calc non Af Amer: >60 mL/min (ref >60)
GLUCOSE: 103 mg/dL — AB (ref 65–99)
POTASSIUM: 3.9 mmol/L (ref 3.5–5.1)
Sodium: 139 mmol/L (ref 135–145)
TOTAL PROTEIN: 6.6 g/dL (ref 6.5–8.1)

## 2016-02-15 LAB — CBC
HEMATOCRIT: 30.1 % — AB (ref 39.0–52.0)
Hemoglobin: 9.7 g/dL — ABNORMAL LOW (ref 13.0–17.0)
MCH: 29.2 pg (ref 26.0–34.0)
MCHC: 32.2 g/dL (ref 30.0–36.0)
MCV: 90.7 fL (ref 78.0–100.0)
Platelets: 211 10*3/uL (ref 150–400)
RBC: 3.32 MIL/uL — ABNORMAL LOW (ref 4.22–5.81)
RDW: 13.1 % (ref 11.5–15.5)
WBC: 13.1 10*3/uL — ABNORMAL HIGH (ref 4.0–10.5)

## 2016-02-15 LAB — MAGNESIUM: Magnesium: 1.6 mg/dL — ABNORMAL LOW (ref 1.7–2.4)

## 2016-02-15 LAB — GLUCOSE, CAPILLARY
GLUCOSE-CAPILLARY: 107 mg/dL — AB (ref 65–99)
Glucose-Capillary: 159 mg/dL — ABNORMAL HIGH (ref 65–99)
Glucose-Capillary: 84 mg/dL (ref 65–99)
Glucose-Capillary: 86 mg/dL (ref 65–99)
Glucose-Capillary: 229 mg/dL — ABNORMAL HIGH (ref 65–99)

## 2016-02-15 LAB — TSH: TSH: 0.842 u[IU]/mL (ref 0.350–4.500)

## 2016-02-15 LAB — MRSA PCR SCREENING: MRSA BY PCR: NEGATIVE

## 2016-02-15 LAB — PHOSPHORUS: Phosphorus: 3.1 mg/dL (ref 2.5–4.6)

## 2016-02-15 NOTE — Clinical Social Work Placement (Signed)
   CLINICAL SOCIAL WORK PLACEMENT  NOTE  Date:  02/15/2016  Patient Details  Name: Manuel Higgins MRN: [id number] Date of Birth: 01/21/1950  Clinical Social Work is seeking post-discharge placement for this patient at the Assisted Living Facility level of care (*CSW will initial, date and re-position this form in  chart as items are completed):  No   Patient/family provided with [place] Clinical Social Work Department's list of facilities offering this level of care within the geographic area requested by the patient (or if unable, by the patient's family).  No   Patient/family informed of their freedom to choose among providers that offer the needed level of care, that participate in Medicare, Medicaid or managed care program needed by the patient, have an available bed and are willing to accept the patient.  No   Patient/family informed of 's ownership interest in [facility] and [facility], as well as of the fact that they are under no obligation to receive care at these facilities.  PASRR submitted to EDS on       PASRR number received on       Existing PASRR number confirmed on       FL2 transmitted to all facilities in geographic area requested by pt/family on   N/A  FL2 transmitted to all facilities within larger geographic area on   N/A    Patient informed that his/her managed care company has contracts with or will negotiate with certain facilities, including the following:            Patient/family informed of bed offers received. N/A  Patient [facility] bed at    [facility]    Physician recommends and patient chooses bed at    [facility]   Patient to be transferred to   on 02/15/16.  Patient to be transferred to facility by PTAR     Patient family notified on 02/15/16 of transfer.  Name of family member notified:        PHYSICIAN       Additional Comment:  [facility]  Facility reports they will not need an FL2  because  patient was admitted for one night.   _______________________________________________ Clearance CootsNicole A Bernon Arviso, LCSW 02/15/2016, 12:28 PM

## 2016-02-15 NOTE — Discharge Summary (Signed)
Physician Discharge Summary  Manuel Higgins XWR:[id number] DOB: 14-Mar-1950 DOA: 02/14/2016  PCP: Manuel Higgins  Admit date: 02/14/2016 Discharge date: 02/15/2016  [facility]  [facility]   Recommendations for Outpatient Follow-up:  1. 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 Eating breakfast and has eaten most of what is on the tray. Asking if he can go home today.   Brief Summary: Manuel Higgins is a 66 y.o. male with medical history significant of HTN, CVA, Dementia, DM 2 on insulin who lives in a nursing facility and is sent to the hospital for a CBG down to 15. Per notes from a nurse at the facility, he was given 100 U of  NovoLog by accident. He was started on a D10 infusion in the ER.   Hospital Course:  . Acute encephalopathy/ hypoglycemia due to accidental overdose of Novolog - now is back to baseline - can resume prior dose of Insulin today - he is eating quite well  . Essential hypertension / accelerated HTN - BP reading 121/ 80 in ER - stable today as home medications resumed  . HLD (hyperlipidemia)  stable continue home medications  . Hypokalemia  - Replaced and check magnesium  . Leukocytosis - likely stress de margination- improving - - CXR clear- no respiratory symptoms- no fever -  UA negative     Discharge Instructions  Discharge Instructions    Diet - low sodium heart healthy    Complete by:  As directed    Diet Carb Modified    Complete by:  As directed    Increase activity slowly    Complete by:  As  directed      Allergies as of 02/15/2016      Reactions   Risperdal [risperidone]    Unknown reaction per [facility]       Medication List    TAKE these medications   acetaminophen 500 MG tablet Commonly known as:  TYLENOL Take 500 mg by mouth every 4 (four) hours as needed for mild pain, moderate pain, fever or headache.   ALMACONE 200-200-20 MG/5ML suspension Generic drug:  alum & mag hydroxide-simeth Take 30 mLs by mouth as needed for indigestion or heartburn.   aspirin 81 MG chewable tablet Chew 1 tablet (81 mg total) by mouth daily. What changed:  when to take this   atorvastatin 80 MG tablet Commonly known as:  LIPITOR Take 1 tablet (80 mg total) by mouth daily at 6 PM.   baclofen 10 MG tablet Commonly known as:  LIORESAL Take 1 tablet (10 mg total) by mouth at bedtime.   divalproex 125 MG capsule Commonly known as:  DEPAKOTE SPRINKLE Take 125 mg by mouth 2 (two) times daily.   donepezil 10 MG tablet Commonly known as:  ARICEPT Take 10 mg by mouth at bedtime.   FLUoxetine 40 MG capsule Commonly known as:  PROZAC Take 40 mg by mouth daily with breakfast.   guaifenesin 100 MG/5ML syrup Commonly known as:  ROBITUSSIN Take 200 mg by mouth every 6 (six) hours as needed  for cough.   LEVEMIR FLEXPEN 100 UNIT/ML Pen Generic drug:  Insulin Detemir Inject 5 Units into the skin at bedtime.   lisinopril 10 MG tablet Commonly known as:  PRINIVIL,ZESTRIL Take 1 tablet (10 mg total) by mouth daily. What changed:  when to take this   loperamide 2 MG tablet Commonly known as:  IMODIUM A-D Take 2 mg by mouth as needed for diarrhea or loose stools.   magnesium hydroxide 400 MG/5ML suspension Commonly known as:  MILK OF MAGNESIA Take 30 mLs by mouth at bedtime as needed for mild constipation.   memantine 5 MG tablet Commonly known as:  NAMENDA Take 5 mg by mouth 2 (two) times daily.   metFORMIN 500 MG tablet Commonly known as:  GLUCOPHAGE Take 500 mg by mouth 2 (two)  times daily with a meal.   neomycin-bacitracin-polymyxin ointment Commonly known as:  NEOSPORIN Apply 1 application topically as needed for wound care.   NOVOLOG FLEXPEN 100 UNIT/ML FlexPen Generic drug:  insulin aspart Inject 2-12 Units into the skin 3 (three) times daily with meals. Per sliding scale  150 - 200 = 2 units 201 - 250 = 4 units  251 - 300 = 6 units  301 - 350 = 8 units  351 - 400 = 10 units > 401 = 12 units and Call MD   Vitamin D 2000 units Caps Take 2,000 Units by mouth daily with breakfast.       Allergies  Allergen Reactions  . Risperdal [Risperidone]     Unknown reaction per [facility]      Procedures/Studies:   Dg Chest 2 View  Result Date: 02/14/2016 CLINICAL DATA:  Hypoglycemia.  Leukocytosis. EXAM: CHEST  2 VIEW COMPARISON:  05/07/2015 FINDINGS: The heart size and mediastinal contours are within normal limits. Both lungs are clear. The visualized skeletal structures are unremarkable. A loop recorder is visible in the left anterior subcutaneous tissues. IMPRESSION: No active cardiopulmonary disease. Electronically Signed   By: Ellery Plunk M.D.   On: 02/14/2016 22:21       Discharge Exam: Vitals:   02/15/16 0409 02/15/16 0912  BP: (!) 152/73 (!) 147/63  Pulse: 71 84  Resp: 18 18  Temp: 98.4 F (36.9 C) 98.6 F (37 C)   Vitals:   02/14/16 2245 02/14/16 2304 02/15/16 0409 02/15/16 0912  BP:  (!) 220/100 (!) 152/73 (!) 147/63  Pulse:  78 71 84  Resp:  18 18 18   Temp:  97.3 F (36.3 C) 98.4 F (36.9 C) 98.6 F (37 C)  TempSrc:  Oral Oral Oral  SpO2:  100% 100% 100%  Weight: 73 kg (160 lb 15 oz)     Height: 5\' 9"  (1.753 m)       General: Pt is alert, awake, not in acute distress Cardiovascular: RRR, S1/S2 +, no rubs, no gallops Respiratory: CTA bilaterally, no wheezing, no rhonchi Abdominal: Soft, NT, ND, bowel sounds + Extremities: no edema, no cyanosis    The results of significant diagnostics from this hospitalization (including  imaging, microbiology, ancillary and laboratory) are listed below for reference.     Microbiology: Recent Results (from the past 240 hour(s))  MRSA PCR Screening     Status: None   Collection Time: 02/14/16 11:00 PM  Result Value Ref Range Status   MRSA by PCR NEGATIVE NEGATIVE Final    Comment:        The GeneXpert MRSA Assay (FDA approved for NASAL specimens only), is one component of a comprehensive  MRSA colonization surveillance program. It is not intended to diagnose MRSA infection nor to guide or monitor treatment for MRSA infections.      Labs: BNP (last 3 results) No results for input(s): BNP in the last 8760 hours. Basic Metabolic Panel:  Recent Labs Lab 02/14/16 1913 02/15/16 0538  NA 141 139  K 3.4* 3.9  CL 107 104  CO2 25 28  GLUCOSE 36* 103*  BUN 26* 23*  CREATININE 1.31* 1.08  CALCIUM 8.7* 8.8*  MG  --  1.6*  PHOS  --  3.1   Liver Function Tests:  Recent Labs Lab 02/15/16 0538  AST 20  ALT 14*  ALKPHOS 89  BILITOT 0.4  PROT 6.6  ALBUMIN 3.6   No results for input(s): LIPASE, AMYLASE in the last 168 hours. No results for input(s): AMMONIA in the last 168 hours. CBC:  Recent Labs Lab 02/14/16 1913 02/15/16 0538  WBC 16.2* 13.1*  NEUTROABS 14.5*  --   HGB 10.0* 9.7*  HCT 30.7* 30.1*  MCV 93.6 90.7  PLT 200 211   Cardiac Enzymes: No results for input(s): CKTOTAL, CKMB, CKMBINDEX, TROPONINI in the last 168 hours. BNP: Invalid input(s): POCBNP CBG:  Recent Labs Lab 02/14/16 2339 02/15/16 0209 02/15/16 0406 02/15/16 0644 02/15/16 0746  GLUCAP 260* 159* 107* 84 86   D-Dimer No results for input(s): DDIMER in the last 72 hours. Hgb A1c No results for input(s): HGBA1C in the last 72 hours. Lipid Profile No results for input(s): CHOL, HDL, LDLCALC, TRIG, CHOLHDL, LDLDIRECT in the last 72 hours. Thyroid function studies  Recent Labs  02/15/16 0538  TSH 0.842   Anemia work up No results for input(s): VITAMINB12,  FOLATE, FERRITIN, TIBC, IRON, RETICCTPCT in the last 72 hours. Urinalysis    Component Value Date/Time   COLORURINE YELLOW 02/14/2016 2130   APPEARANCEUR CLEAR 02/14/2016 2130   LABSPEC 1.016 02/14/2016 2130   PHURINE 6.0 02/14/2016 2130   GLUCOSEU 50 (A) 02/14/2016 2130   HGBUR NEGATIVE 02/14/2016 2130   BILIRUBINUR NEGATIVE 02/14/2016 2130   KETONESUR NEGATIVE 02/14/2016 2130   PROTEINUR 100 (A) 02/14/2016 2130   UROBILINOGEN 0.2 04/18/2013 2134   NITRITE NEGATIVE 02/14/2016 2130   LEUKOCYTESUR NEGATIVE 02/14/2016 2130   Sepsis Labs Invalid input(s): PROCALCITONIN,  WBC,  LACTICIDVEN Microbiology Recent Results (from the past 240 hour(s))  MRSA PCR Screening     Status: None   Collection Time: 02/14/16 11:00 PM  Result Value Ref Range Status   MRSA by PCR NEGATIVE NEGATIVE Final    Comment:        The GeneXpert MRSA Assay (FDA approved for NASAL specimens only), is one component of a comprehensive MRSA colonization surveillance program. It is not intended to diagnose MRSA infection nor to guide or monitor treatment for MRSA infections.      Time coordinating discharge: Over 30 minutes  SIGNED:   Calvert CantorIZWAN,Jeydan Barner, MD  [facility] 02/15/2016, 9:27 AM Pager   If 7PM-7AM, please contact night-coverage [email or web address] Password [email or web address]

## 2016-02-15 NOTE — Progress Notes (Signed)
LCSWA assiting with patient discharge back to [facility]. D/c Summary faxed. Facility report they do not need FL2.  LCSWA notified pt. sister Buel ReamLilly Bertha PTAR to be called for transport.   Vivi BarrackNicole Naila Elizondo, Theresia MajorsLCSWA, MSW Clinical Social Worker 5E and Psychiatric Service Line [phone] 02/15/2016  12:46 PM

## 2016-02-16 LAB — HEMOGLOBIN A1C
HEMOGLOBIN A1C: 7.3 % — AB (ref 4.8–5.6)
MEAN PLASMA GLUCOSE: 163 mg/dL

## 2016-03-27 ENCOUNTER — Encounter (HOSPITAL_COMMUNITY): Payer: Self-pay

## 2016-03-27 ENCOUNTER — Inpatient Hospital Stay (HOSPITAL_COMMUNITY)
Admission: EM | Admit: 2016-03-27 | Discharge: 2016-03-30 | DRG: 684 | Disposition: A | Discharge status: Home / Self Care | Payer: Medicaid Other | Attending: Internal Medicine | Admitting: Internal Medicine

## 2016-03-27 ENCOUNTER — Emergency Department (HOSPITAL_COMMUNITY): Payer: Medicaid Other

## 2016-03-27 DIAGNOSIS — W19XXXA Unspecified fall, initial encounter: Secondary | ICD-10-CM | POA: Diagnosis present

## 2016-03-27 DIAGNOSIS — Z888 Allergy status to other drugs, medicaments and biological substances status: Secondary | ICD-10-CM

## 2016-03-27 DIAGNOSIS — N179 Acute kidney failure, unspecified: Principal | ICD-10-CM | POA: Diagnosis present

## 2016-03-27 DIAGNOSIS — I10 Essential (primary) hypertension: Secondary | ICD-10-CM | POA: Diagnosis present

## 2016-03-27 DIAGNOSIS — E119 Type 2 diabetes mellitus without complications: Secondary | ICD-10-CM | POA: Diagnosis present

## 2016-03-27 DIAGNOSIS — Z79899 Other long term (current) drug therapy: Secondary | ICD-10-CM

## 2016-03-27 DIAGNOSIS — D649 Anemia, unspecified: Secondary | ICD-10-CM | POA: Diagnosis present

## 2016-03-27 DIAGNOSIS — I951 Orthostatic hypotension: Secondary | ICD-10-CM | POA: Diagnosis present

## 2016-03-27 DIAGNOSIS — Z8673 Personal history of transient ischemic attack (TIA), and cerebral infarction without residual deficits: Secondary | ICD-10-CM

## 2016-03-27 DIAGNOSIS — Z794 Long term (current) use of insulin: Secondary | ICD-10-CM

## 2016-03-27 DIAGNOSIS — E785 Hyperlipidemia, unspecified: Secondary | ICD-10-CM | POA: Diagnosis present

## 2016-03-27 DIAGNOSIS — Y92129 Unspecified place in nursing home as the place of occurrence of the external cause: Secondary | ICD-10-CM

## 2016-03-27 DIAGNOSIS — Z7982 Long term (current) use of aspirin: Secondary | ICD-10-CM

## 2016-03-27 DIAGNOSIS — Z87891 Personal history of nicotine dependence: Secondary | ICD-10-CM

## 2016-03-27 DIAGNOSIS — E875 Hyperkalemia: Secondary | ICD-10-CM | POA: Diagnosis present

## 2016-03-27 DIAGNOSIS — Z95818 Presence of other cardiac implants and grafts: Secondary | ICD-10-CM

## 2016-03-27 DIAGNOSIS — F319 Bipolar disorder, unspecified: Secondary | ICD-10-CM | POA: Diagnosis present

## 2016-03-27 DIAGNOSIS — F0390 Unspecified dementia without behavioral disturbance: Secondary | ICD-10-CM | POA: Diagnosis present

## 2016-03-27 LAB — BASIC METABOLIC PANEL
ANION GAP: 7 (ref 5–15)
BUN: 49 mg/dL — AB (ref 6–20)
CHLORIDE: 108 mmol/L (ref 101–111)
CO2: 26 mmol/L (ref 22–32)
Calcium: 9.2 mg/dL (ref 8.9–10.3)
Creatinine, Ser: 2.2 mg/dL — ABNORMAL HIGH (ref 0.61–1.24)
GFR calc Af Amer: 35 mL/min — ABNORMAL LOW (ref >60)
GFR calc non Af Amer: 30 mL/min — ABNORMAL LOW (ref >60)
GLUCOSE: 131 mg/dL — AB (ref 65–99)
POTASSIUM: 5.2 mmol/L — AB (ref 3.5–5.1)
Sodium: 141 mmol/L (ref 135–145)

## 2016-03-27 LAB — CBC WITH DIFFERENTIAL/PLATELET
BASOS ABS: 0 10*3/uL (ref 0.0–0.1)
Basophils Relative: 0 %
EOS PCT: 0 %
Eosinophils Absolute: 0 10*3/uL (ref 0.0–0.7)
HEMATOCRIT: 29.1 % — AB (ref 39.0–52.0)
HEMOGLOBIN: 9.8 g/dL — AB (ref 13.0–17.0)
LYMPHS ABS: 1.1 10*3/uL (ref 0.7–4.0)
LYMPHS PCT: 12 %
MCH: 31.6 pg (ref 26.0–34.0)
MCHC: 33.7 g/dL (ref 30.0–36.0)
MCV: 93.9 fL (ref 78.0–100.0)
Monocytes Absolute: 0.6 10*3/uL (ref 0.1–1.0)
Monocytes Relative: 7 %
NEUTROS ABS: 7.3 10*3/uL (ref 1.7–7.7)
Neutrophils Relative %: 81 %
Platelets: 168 10*3/uL (ref 150–400)
RBC: 3.1 MIL/uL — AB (ref 4.22–5.81)
RDW: 13.7 % (ref 11.5–15.5)
WBC: 9 10*3/uL (ref 4.0–10.5)

## 2016-03-27 LAB — URINALYSIS, ROUTINE W REFLEX MICROSCOPIC
Bilirubin Urine: NEGATIVE
GLUCOSE, UA: NEGATIVE mg/dL
HGB URINE DIPSTICK: NEGATIVE
Ketones, ur: NEGATIVE mg/dL
LEUKOCYTES UA: NEGATIVE
Nitrite: NEGATIVE
PROTEIN: NEGATIVE mg/dL
Specific Gravity, Urine: 1.018 (ref 1.005–1.030)
pH: 5 (ref 5.0–8.0)

## 2016-03-27 MED ORDER — SODIUM CHLORIDE 0.9 % IV BOLUS (SEPSIS)
500.0000 mL | Freq: Once | INTRAVENOUS | Status: AC
  Administered 2016-03-28: 500 mL via INTRAVENOUS

## 2016-03-27 NOTE — ED Provider Notes (Signed)
[facility]-EMERGENCY DEPT Provider Note   CSN: [id number] Arrival date & time: 03/27/16  2138     History   Chief Complaint Chief Complaint  Patient presents with  . Fall    HPI Manuel Higgins is a 66 y.o. male.  Patient presents from his nursing facility with an unwitnessed fall. Patient with history of dementia. He is oriented to person, time, and place. He reports that he does not recall falling.  He denies chest pain, shortness of breath, head/neck/back/abdomen/pelvis/extremity pain.  He is moving all extremities--no strength deficit noted.   The history is provided by the EMS personnel. No language interpreter was used.  Fall  This is a new problem. The current episode started 1 to 2 hours ago. The problem has been gradually improving. Pertinent negatives include no chest pain, no abdominal pain, no headaches and no shortness of breath.    Past Medical History:  Diagnosis Date  . Anemia   . Dementia    unspecified w/o behavioral disturbance  . Diabetes mellitus without complication (HCC)   . Dysphagia   . Hyperlipidemia   . Hypertension   . Hypertensive crisis 02/15/2013  . Stroke [facility]) 03/2013   Multiple infarcts  . Toe ulcer [facility])     Patient Active Problem List   Diagnosis Date Noted  . 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 Hypokalemia 02/14/2016  . Leukocytosis 02/14/2016  . Altered mental status 05/07/2015  . History of embolic stroke 05/07/2015  . History of CVA with residual deficit 05/07/2015  . Acute encephalopathy 05/07/2015  . Essential hypertension 04/24/2014  . HLD (hyperlipidemia) 04/24/2014  . Dysphagia 04/04/2013  . Callus of foot 02/16/2013    Past Surgical History:  Procedure Laterality Date  . BACK SURGERY    . LOOP RECORDER IMPLANT  04/08/2013   MDT LinQ implanted by Dr Ladona Ridgel for cryptogenic stroke  . LOOP RECORDER IMPLANT N/A 04/08/2013   Procedure: LOOP RECORDER IMPLANT;  Surgeon: Marinus Maw,  MD;  Location[facility] C[facility] LAB;  Service: Cardiovascular;  Laterality: N/A;  . TEE WITHOUT CARDIOVERSION N/A 04/07/2013   Procedure: TRANSESOPHAGEAL ECHOCARDIOGRAM (TEE);  Surgeon: Lars Masson, MD;  Location: [facility] ENDOSCOPY;  Service: Cardiovascular;  Laterality: N/A;  . TONSILLECTOMY         Home Medications    Prior to Admission medications   Medication Sig Start Date End Date Taking? Authorizing Provider  acetaminophen (TYLENOL) 500 MG tablet Take 500 mg by mouth every 4 (four) hours as needed for mild pain, moderate pain, fever or headache.    Historical Provider, MD  alum & mag hydroxide-simeth (ALMACONE) 200-200-20 MG/5ML suspension Take 30 mLs by mouth as needed for indigestion or heartburn.    Historical Provider, MD  aspirin 81 MG chewable tablet Chew 1 tablet (81 mg total) by mouth daily. Patient taking differently: Chew 81 mg by mouth daily with breakfast.  05/10/15   Selina Cooley, MD  atorvastatin (LIPITOR) 80 MG tablet Take 1 tablet (80 mg total) by mouth daily at 6 PM. 05/10/15   Selina Cooley, MD  baclofen (LIORESAL) 10 MG tablet Take 1 tablet (10 mg total) by mouth at bedtime. 05/10/15   Selina Cooley, MD  Cholecalciferol (VITAMIN D) 2000 units CAPS Take 2,000 Units by mouth daily with breakfast.     Historical Provider, MD  divalproex (DEPAKOTE SPRINKLE) 125 MG capsule Take 125 mg by mouth 2 (two) times daily.    Historical Provider, MD  donepezil (ARICEPT) 10  MG tablet Take 10 mg by mouth at bedtime.    Historical Provider, MD  FLUoxetine (PROZAC) 40 MG capsule Take 40 mg by mouth daily with breakfast.    Historical Provider, MD  guaifenesin (ROBITUSSIN) 100 MG/5ML syrup Take 200 mg by mouth every 6 (six) hours as needed for cough.    Historical Provider, MD  insulin aspart (NOVOLOG FLEXPEN) 100 UNIT/ML FlexPen Inject 2-12 Units into the skin 3 (three) times daily with meals. Per sliding scale  150 - 200 = 2 units 201 - 250 = 4 units  251 - 300 = 6 units  301 - 350 = 8 units    351 - 400 = 10 units > 401 = 12 units and Call MD    Historical Provider, MD  Insulin Detemir (LEVEMIR FLEXPEN) 100 UNIT/ML Pen Inject 5 Units into the skin at bedtime.    Historical Provider, MD  lisinopril (PRINIVIL,ZESTRIL) 10 MG tablet Take 1 tablet (10 mg total) by mouth daily. Patient taking differently: Take 10 mg by mouth daily with breakfast.  04/09/13   Ripudeep Jenna Luo, MD  loperamide (IMODIUM A-D) 2 MG tablet Take 2 mg by mouth as needed for diarrhea or loose stools.    Historical Provider, MD  magnesium hydroxide (MILK OF MAGNESIA) 400 MG/5ML suspension Take 30 mLs by mouth at bedtime as needed for mild constipation.    Historical Provider, MD  memantine (NAMENDA) 5 MG tablet Take 5 mg by mouth 2 (two) times daily.    Historical Provider, MD  metFORMIN (GLUCOPHAGE) 500 MG tablet Take 500 mg by mouth 2 (two) times daily with a meal.    Historical Provider, MD  neomycin-bacitracin-polymyxin (NEOSPORIN) ointment Apply 1 application topically as needed for wound care.    Historical Provider, MD    Family History Family History  Problem Relation Age of Onset  . Family history unknown: Yes    Social History Social History  Substance Use Topics  . Smoking status: Former Smoker    Packs/day: 0.33    Years: 10.00    Types: Cigarettes    Quit date: 01/17/2004  . Smokeless tobacco: Never Used  . Alcohol use No     Comment: Denies Currently but previously marked as yes     Allergies   Risperdal [risperidone]   Review of Systems Review of Systems  Unable to perform ROS: Dementia  Respiratory: Negative for shortness of breath.   Cardiovascular: Negative for chest pain.  Gastrointestinal: Negative for abdominal pain.  Neurological: Negative for headaches.     Physical Exam Updated Vital Signs BP 173/63 (BP Location: Right Arm)   Pulse 89   Temp 98.8 F (37.1 C) (Oral)   Resp 16   Ht 5\' 9"  (1.753 m)   Wt 76.7 kg   SpO2 97%   BMI 24.96 kg/m   Physical Exam   Constitutional: He is oriented to person, place, and time. He appears well-developed and well-nourished.  HENT:  Head: Normocephalic and atraumatic.  Eyes: Conjunctivae are normal. Pupils are equal, round, and reactive to light.  Neck: Normal range of motion. Neck supple.  Cardiovascular: Normal rate and regular rhythm.   Pulmonary/Chest: Effort normal and breath sounds normal.  Abdominal: Soft. Bowel sounds are normal.  Musculoskeletal: Normal range of motion. He exhibits no edema, tenderness or deformity.  Neurological: He is oriented to person, place, and time.  Skin: Skin is warm and dry. No rash noted.  Psychiatric: He has a normal mood and affect.  Nursing  note and vitals reviewed.    ED Treatments / Results  Labs (all labs ordered are listed, but only abnormal results are displayed) Labs Reviewed  CBC WITH DIFFERENTIAL/PLATELET - Abnormal; Notable for the following:       Result Value   RBC 3.10 (*)    Hemoglobin 9.8 (*)    HCT 29.1 (*)    All other components within normal limits  BASIC METABOLIC PANEL - Abnormal; Notable for the following:    Potassium 5.2 (*)    Glucose, Bld 131 (*)    BUN 49 (*)    Creatinine, Ser 2.20 (*)    GFR calc non Af Amer 30 (*)    GFR calc Af Amer 35 (*)    All other components within normal limits  URINALYSIS, ROUTINE W REFLEX MICROSCOPIC    EKG  EKG Interpretation  Date/Time:  Monday March 27 2016 22:26:39 EDT Ventricular Rate:  88 PR Interval:    QRS Duration: 86 QT Interval:  364 QTC Calculation: 441 R Axis:   66 Text Interpretation:  Sinus rhythm Probable left atrial enlargement Probable LVH with secondary repol abnrm Anterior ST elevation, probably due to LVH Confirmed by Ranae PalmsYELVERTON  MD, Kolton Kienle ([id number]) on 03/27/2016 11:29:16 PM       Radiology Ct Head Wo Contrast  Result Date: 03/27/2016 CLINICAL DATA:  Unwitnessed fall. Patient does not recall fall. History of dementia, hypertension, diabetes, hyperlipidemia. EXAM: CT  HEAD WITHOUT CONTRAST TECHNIQUE: Contiguous axial images were obtained from the base of the skull through the vertex without intravenous contrast. COMPARISON:  MRI of the brain May 07, 2015 FINDINGS: BRAIN: No intraparenchymal hemorrhage, mass effect nor midline shift. Moderate ventriculomegaly on the basis of global parenchymal brain volume loss. Multiple bilateral cerebellar, basal ganglia and thalamus lacunar infarcts. Advanced brainstem volume loss. Confluent supratentorial and pontine white matter hypodensities. No abnormal extra-axial fluid collections. Basal cisterns are patent. VASCULAR: Moderate calcific atherosclerosis of the carotid siphons. SKULL: No skull fracture. No significant scalp soft tissue swelling. SINUSES/ORBITS: Trace paranasal sinus mucosal thickening. Mastoid air cells are well aerated. The included ocular globes and orbital contents are non-suspicious. OTHER: None. IMPRESSION: 1. No acute intracranial process. 2. Stable chronic changes including multiple old small infarcts and moderate to severe chronic small vessel ischemic disease with moderate brain atrophy. Electronically Signed   By: Awilda Metroourtnay  Bloomer M.D.   On: 03/27/2016 23:14    Procedures Procedures (including critical care time)  Medications Ordered in ED Medications - No data to display   Initial Impression / Assessment and Plan / ED Course  I have reviewed the triage vital signs and the nursing notes.  Pertinent labs & imaging results that were available during my care of the patient were reviewed by me and considered in my medical decision making (see chart for details).    Patient discussed with and seen by Dr. Ranae PalmsYelverton.    New renal insufficiency. IV fluids initiated.  Will request admission.   Final Clinical Impressions(s) / ED Diagnoses   Final diagnoses:  Fall, initial encounter  Acute kidney injury [facility](HCC)    New Prescriptions New Prescriptions   No medications on file     Felicie Mornavid Masaji Billups,  NP 03/28/16 0040    Loren Raceravid Yelverton, MD 03/31/16 [phone]

## 2016-03-27 NOTE — ED Notes (Signed)
PT AMBULATED TO THE BATHROOM WITH ASSISTANCE.

## 2016-03-27 NOTE — ED Notes (Signed)
Patient transported to CT 

## 2016-03-27 NOTE — ED Triage Notes (Signed)
PT RECEIVED FROM [facility] VIA EMS FOR AN UNWITNESSED FALL. PER EMS, THE PT WAS FOUND ON THE FLOOR IN HIS ROOM BY THE STAFF, NO OBVIOUS INJURIES. PT HAS A HX OF DEMENTIA, AND DOES NOT REMEMBER FALLING. PT IS AAOX3 AND DENIES ANY PAIN OR INJURIES AT THIS TIME.

## 2016-03-27 NOTE — ED Notes (Signed)
[facility] Expected date:  Expected time:  Means of arrival:  Comments: 66 yo m unwitnessed fall

## 2016-03-27 NOTE — ED Notes (Signed)
PT CONSTANTLY GETTING OUT OF THE BED WITH CONSTANT REDIRECTING. CHARGE NURSE MADE AWARE.

## 2016-03-28 ENCOUNTER — Observation Stay (HOSPITAL_COMMUNITY): Payer: Medicaid Other

## 2016-03-28 ENCOUNTER — Encounter (HOSPITAL_COMMUNITY): Payer: Self-pay | Admitting: Internal Medicine

## 2016-03-28 DIAGNOSIS — Z95818 Presence of other cardiac implants and grafts: Secondary | ICD-10-CM | POA: Diagnosis not present

## 2016-03-28 DIAGNOSIS — W19XXXA Unspecified fall, initial encounter: Secondary | ICD-10-CM | POA: Diagnosis present

## 2016-03-28 DIAGNOSIS — I10 Essential (primary) hypertension: Secondary | ICD-10-CM

## 2016-03-28 DIAGNOSIS — N179 Acute kidney failure, unspecified: Principal | ICD-10-CM

## 2016-03-28 DIAGNOSIS — Z888 Allergy status to other drugs, medicaments and biological substances status: Secondary | ICD-10-CM | POA: Diagnosis not present

## 2016-03-28 DIAGNOSIS — Z8673 Personal history of transient ischemic attack (TIA), and cerebral infarction without residual deficits: Secondary | ICD-10-CM | POA: Diagnosis not present

## 2016-03-28 DIAGNOSIS — F319 Bipolar disorder, unspecified: Secondary | ICD-10-CM | POA: Diagnosis present

## 2016-03-28 DIAGNOSIS — Y92129 Unspecified place in nursing home as the place of occurrence of the external cause: Secondary | ICD-10-CM | POA: Diagnosis not present

## 2016-03-28 DIAGNOSIS — E875 Hyperkalemia: Secondary | ICD-10-CM | POA: Diagnosis present

## 2016-03-28 DIAGNOSIS — Z79899 Other long term (current) drug therapy: Secondary | ICD-10-CM | POA: Diagnosis not present

## 2016-03-28 DIAGNOSIS — F0390 Unspecified dementia without behavioral disturbance: Secondary | ICD-10-CM | POA: Diagnosis present

## 2016-03-28 DIAGNOSIS — Z794 Long term (current) use of insulin: Secondary | ICD-10-CM | POA: Diagnosis not present

## 2016-03-28 DIAGNOSIS — I951 Orthostatic hypotension: Secondary | ICD-10-CM | POA: Diagnosis present

## 2016-03-28 DIAGNOSIS — D649 Anemia, unspecified: Secondary | ICD-10-CM | POA: Diagnosis present

## 2016-03-28 DIAGNOSIS — E119 Type 2 diabetes mellitus without complications: Secondary | ICD-10-CM

## 2016-03-28 DIAGNOSIS — W19XXXD Unspecified fall, subsequent encounter: Secondary | ICD-10-CM | POA: Diagnosis not present

## 2016-03-28 DIAGNOSIS — E785 Hyperlipidemia, unspecified: Secondary | ICD-10-CM | POA: Diagnosis present

## 2016-03-28 DIAGNOSIS — Z7982 Long term (current) use of aspirin: Secondary | ICD-10-CM | POA: Diagnosis not present

## 2016-03-28 DIAGNOSIS — Z87891 Personal history of nicotine dependence: Secondary | ICD-10-CM | POA: Diagnosis not present

## 2016-03-28 LAB — CBC
HCT: 29 % — ABNORMAL LOW (ref 39.0–52.0)
HEMOGLOBIN: 9.8 g/dL — AB (ref 13.0–17.0)
MCH: 31.6 pg (ref 26.0–34.0)
MCHC: 33.8 g/dL (ref 30.0–36.0)
MCV: 93.5 fL (ref 78.0–100.0)
Platelets: 164 10*3/uL (ref 150–400)
RBC: 3.1 MIL/uL — AB (ref 4.22–5.81)
RDW: 13.7 % (ref 11.5–15.5)
WBC: 8.8 10*3/uL (ref 4.0–10.5)

## 2016-03-28 LAB — VALPROIC ACID LEVEL

## 2016-03-28 LAB — BASIC METABOLIC PANEL
ANION GAP: 8 (ref 5–15)
BUN: 48 mg/dL — ABNORMAL HIGH (ref 6–20)
CALCIUM: 9 mg/dL (ref 8.9–10.3)
CO2: 25 mmol/L (ref 22–32)
Chloride: 108 mmol/L (ref 101–111)
Creatinine, Ser: 1.83 mg/dL — ABNORMAL HIGH (ref 0.61–1.24)
GFR, EST AFRICAN AMERICAN: 43 mL/min — AB (ref >60)
GFR, EST NON AFRICAN AMERICAN: 37 mL/min — AB (ref >60)
Glucose, Bld: 118 mg/dL — ABNORMAL HIGH (ref 65–99)
Potassium: 4.7 mmol/L (ref 3.5–5.1)
SODIUM: 141 mmol/L (ref 135–145)

## 2016-03-28 LAB — TROPONIN I

## 2016-03-28 LAB — GLUCOSE, CAPILLARY
GLUCOSE-CAPILLARY: 104 mg/dL — AB (ref 65–99)
Glucose-Capillary: 130 mg/dL — ABNORMAL HIGH (ref 65–99)

## 2016-03-28 LAB — CK: CK TOTAL: 215 U/L (ref 49–397)

## 2016-03-28 LAB — MRSA PCR SCREENING: MRSA by PCR: NEGATIVE

## 2016-03-28 MED ORDER — FLUOXETINE HCL 20 MG PO CAPS
40.0000 mg | ORAL_CAPSULE | Freq: Every day | ORAL | Status: DC
Start: 2016-03-28 — End: 2016-03-30
  Administered 2016-03-28 – 2016-03-30 (×3): 40 mg via ORAL
  Filled 2016-03-28 (×3): qty 2

## 2016-03-28 MED ORDER — ATORVASTATIN CALCIUM 40 MG PO TABS
80.0000 mg | ORAL_TABLET | Freq: Every day | ORAL | Status: DC
  Administered 2016-03-28 – 2016-03-30 (×3): 80 mg via ORAL
  Filled 2016-03-28 (×3): qty 2

## 2016-03-28 MED ORDER — HYDRALAZINE HCL 25 MG PO TABS
25.0000 mg | ORAL_TABLET | Freq: Three times a day (TID) | ORAL | Status: DC
  Administered 2016-03-28 – 2016-03-30 (×7): 25 mg via ORAL
  Filled 2016-03-28 (×7): qty 1

## 2016-03-28 MED ORDER — DONEPEZIL HCL 5 MG PO TABS
5.0000 mg | ORAL_TABLET | Freq: Every day | ORAL | Status: DC
  Administered 2016-03-28: 5 mg via ORAL
  Filled 2016-03-28: qty 1

## 2016-03-28 MED ORDER — INSULIN DETEMIR 100 UNIT/ML ~~LOC~~ SOLN
5.0000 [IU] | Freq: Every day | SUBCUTANEOUS | Status: DC
  Administered 2016-03-28 – 2016-03-29 (×3): 5 [IU] via SUBCUTANEOUS
  Filled 2016-03-28 (×4): qty 0.05

## 2016-03-28 MED ORDER — MEMANTINE HCL 10 MG PO TABS
5.0000 mg | ORAL_TABLET | Freq: Two times a day (BID) | ORAL | Status: DC
  Administered 2016-03-28 – 2016-03-30 (×6): 5 mg via ORAL
  Filled 2016-03-28 (×6): qty 1

## 2016-03-28 MED ORDER — ONDANSETRON HCL 4 MG PO TABS: 4.0000 mg | ORAL_TABLET | Freq: Four times a day (QID) | ORAL | Status: DC | PRN

## 2016-03-28 MED ORDER — ONDANSETRON HCL 4 MG/2ML IJ SOLN: 4.0000 mg | Freq: Four times a day (QID) | INTRAMUSCULAR | Status: DC | PRN

## 2016-03-28 MED ORDER — AMLODIPINE BESYLATE 5 MG PO TABS: 5.0000 mg | ORAL_TABLET | Freq: Every day | ORAL | Status: DC

## 2016-03-28 MED ORDER — AMLODIPINE BESYLATE 10 MG PO TABS
10.0000 mg | ORAL_TABLET | Freq: Every day | ORAL | Status: DC
  Administered 2016-03-28 – 2016-03-30 (×3): 10 mg via ORAL
  Filled 2016-03-28 (×3): qty 1

## 2016-03-28 MED ORDER — HYDRALAZINE HCL 20 MG/ML IJ SOLN
10.0000 mg | INTRAMUSCULAR | Status: DC | PRN
  Administered 2016-03-28 (×2): 10 mg via INTRAVENOUS
  Filled 2016-03-28 (×2): qty 1

## 2016-03-28 MED ORDER — VITAMIN D 1000 UNITS PO TABS
2000.0000 [IU] | ORAL_TABLET | Freq: Every day | ORAL | Status: DC
  Administered 2016-03-28 – 2016-03-30 (×3): 2000 [IU] via ORAL
  Filled 2016-03-28 (×4): qty 2

## 2016-03-28 MED ORDER — DIVALPROEX SODIUM 125 MG PO CSDR
125.0000 mg | DELAYED_RELEASE_CAPSULE | Freq: Two times a day (BID) | ORAL | Status: DC
  Administered 2016-03-28 – 2016-03-30 (×5): 125 mg via ORAL
  Filled 2016-03-28 (×6): qty 1

## 2016-03-28 MED ORDER — BACLOFEN 10 MG PO TABS
10.0000 mg | ORAL_TABLET | Freq: Every day | ORAL | Status: DC
  Administered 2016-03-28 – 2016-03-29 (×3): 10 mg via ORAL
  Filled 2016-03-28 (×3): qty 1

## 2016-03-28 MED ORDER — ASPIRIN 81 MG PO CHEW
81.0000 mg | CHEWABLE_TABLET | Freq: Every day | ORAL | Status: DC
  Administered 2016-03-28 – 2016-03-30 (×3): 81 mg via ORAL
  Filled 2016-03-28 (×3): qty 1

## 2016-03-28 MED ORDER — ACETAMINOPHEN 325 MG PO TABS: 650.0000 mg | ORAL_TABLET | Freq: Four times a day (QID) | ORAL | Status: DC | PRN

## 2016-03-28 MED ORDER — INSULIN ASPART 100 UNIT/ML ~~LOC~~ SOLN
0.0000 [IU] | Freq: Three times a day (TID) | SUBCUTANEOUS | Status: DC
  Administered 2016-03-29: 2 [IU] via SUBCUTANEOUS
  Administered 2016-03-29 – 2016-03-30 (×3): 1 [IU] via SUBCUTANEOUS

## 2016-03-28 MED ORDER — SODIUM CHLORIDE 0.9 % IV SOLN
INTRAVENOUS | Status: AC
  Administered 2016-03-28 (×2): via INTRAVENOUS

## 2016-03-28 MED ORDER — HEPARIN SODIUM (PORCINE) 5000 UNIT/ML IJ SOLN
5000.0000 [IU] | Freq: Three times a day (TID) | INTRAMUSCULAR | Status: DC
  Administered 2016-03-28 – 2016-03-30 (×8): 5000 [IU] via SUBCUTANEOUS
  Filled 2016-03-28 (×8): qty 1

## 2016-03-28 MED ORDER — ACETAMINOPHEN 650 MG RE SUPP: 650.0000 mg | Freq: Four times a day (QID) | RECTAL | Status: DC | PRN

## 2016-03-28 MED ORDER — GUAIFENESIN 100 MG/5ML PO SOLN: 200.0000 mg | Freq: Four times a day (QID) | ORAL | Status: DC | PRN

## 2016-03-28 NOTE — ED Notes (Signed)
Hospitalist at bedside 

## 2016-03-28 NOTE — Progress Notes (Addendum)
PROGRESS NOTE    Manuel Higgins   ZOX:[id number]  DOB: 1950-01-23  DOA: 03/27/2016 PCP: Mortimer Fries, PA   Brief Narrative:  Manuel Higgins is a 66 y.o. male from ALF with history of CVA, dementia, diabetes mellitus, hypertension was brought to the ER after patient had an unwitnessed fall at nursing home. Patient is a poor historian and is not able to give the exact details of his fall. Lab work revealed acute renal failure with mild hyperkalemia. Orthostatics positive and BP quite elevated. Patient was given 500 mL normal saline bolus and admitted for further hydration and observation.    Subjective: No complaints.   Assessment & Plan:   Principal Problem:   ARF (acute renal failure)- prerenal Orthostatic hypotension - baseline Cr 1.0- 1.3 - cont IVF- holding Lisinopril- Cr slightly improved - stop Aricept - follow oral fluid intake    Fall - likely due to orthostasis- cont to hydrate, check orthostatics in AM and if not orthostatic, can get PT eval  Hyperkalemia - resolved with hydration    Essential hypertension - BP quite high this AM 200/80 checked manually - increased Norvasc from 5 to 10 mg and given a PRN Hydralazine - Lisinopril on hold- will add Hydralazine 25 mg TID     Diabetes mellitus type 2 in nonobese  - last A1c 7.3 recently - low dose lantus and low dose ISS  Dementia - d/c Aricept- cont Depakote and Memantine    DVT prophylaxis: heparin Code Status: Full code Family Communication:  Disposition Plan: when no longer orthostatic, will need PT assessment to see if he can return to SNF Consultants:    Procedures:    Antimicrobials:  Anti-infectives    None       Objective: Vitals:   03/28/16 0151 03/28/16 0813 03/28/16 0815 03/28/16 1038  BP: (!) 181/68 (!) 198/78 (!) 200/80 (!) 141/65  Pulse: 97 81    Resp: 18     Temp: 98.2 F (36.8 C)     TempSrc: Oral     SpO2: 98% 96%    Weight: 76.5 kg (168 lb 10.4 oz)     Height: 5\' 9"  (1.753 m)        Intake/Output Summary (Last 24 hours) at 03/28/16 1503 Last data filed at 03/28/16 1054  Gross per 24 hour  Intake              600 ml  Output             1525 ml  Net             -925 ml   Filed Weights   03/27/16 2156 03/28/16 0151  Weight: 76.7 kg (169 lb) 76.5 kg (168 lb 10.4 oz)    Examination: General exam: Appears comfortable  HEENT: PERRLA, oral mucosa moist, no sclera icterus or thrush Respiratory system: Clear to auscultation. Respiratory effort normal. Cardiovascular system: S1 & S2 heard, RRR.  No murmurs  Gastrointestinal system: Abdomen soft, non-tender, nondistended. Normal bowel sound. No organomegaly Central nervous system: Alert and oriented. No focal neurological deficits. Extremities: No cyanosis, clubbing or edema Skin: No rashes or ulcers Psychiatry:  Mood & affect appropriate.     Data Reviewed: I have personally reviewed following labs and imaging studies  CBC:  Recent Labs Lab 03/27/16 2236 03/28/16 0146  WBC 9.0 8.8  NEUTROABS 7.3  --   HGB 9.8* 9.8*  HCT 29.1* 29.0*  MCV 93.9 93.5  PLT 168 164   Basic Metabolic  Panel:  Recent Labs Lab 03/27/16 2236 03/28/16 0146  NA 141 141  K 5.2* 4.7  CL 108 108  CO2 26 25  GLUCOSE 131* 118*  BUN 49* 48*  CREATININE 2.20* 1.83*  CALCIUM 9.2 9.0   GFR: Estimated Creatinine Clearance: 40.8 mL/min (by C-G formula based on SCr of 1.83 mg/dL (H)). Liver Function Tests: No results for input(s): AST, ALT, ALKPHOS, BILITOT, PROT, ALBUMIN in the last 168 hours. No results for input(s): LIPASE, AMYLASE in the last 168 hours. No results for input(s): AMMONIA in the last 168 hours. Coagulation Profile: No results for input(s): INR, PROTIME in the last 168 hours. Cardiac Enzymes:  Recent Labs Lab 03/28/16 0146  CKTOTAL 215  TROPONINI <0.03   BNP (last 3 results) No results for input(s): PROBNP in the last 8760 hours. HbA1C: No results for input(s): HGBA1C in the last 72  hours. CBG: No results for input(s): GLUCAP in the last 168 hours. Lipid Profile: No results for input(s): CHOL, HDL, LDLCALC, TRIG, CHOLHDL, LDLDIRECT in the last 72 hours. Thyroid Function Tests: No results for input(s): TSH, T4TOTAL, FREET4, T3FREE, THYROIDAB in the last 72 hours. Anemia Panel: No results for input(s): VITAMINB12, FOLATE, FERRITIN, TIBC, IRON, RETICCTPCT in the last 72 hours. Urine analysis:    Component Value Date/Time   COLORURINE YELLOW 03/27/2016 2236   APPEARANCEUR CLEAR 03/27/2016 2236   LABSPEC 1.018 03/27/2016 2236   PHURINE 5.0 03/27/2016 2236   GLUCOSEU NEGATIVE 03/27/2016 2236   HGBUR NEGATIVE 03/27/2016 2236   BILIRUBINUR NEGATIVE 03/27/2016 2236   KETONESUR NEGATIVE 03/27/2016 2236   PROTEINUR NEGATIVE 03/27/2016 2236   UROBILINOGEN 0.2 04/18/2013 2134   NITRITE NEGATIVE 03/27/2016 2236   LEUKOCYTESUR NEGATIVE 03/27/2016 2236   Sepsis Labs: @LABRCNTIP (procalcitonin:4,lacticidven:4) ) Recent Results (from the past 240 hour(s))  MRSA PCR Screening     Status: None   Collection Time: 03/28/16  3:36 AM  Result Value Ref Range Status   MRSA by PCR NEGATIVE NEGATIVE Final    Comment:        The GeneXpert MRSA Assay (FDA approved for NASAL specimens only), is one component of a comprehensive MRSA colonization surveillance program. It is not intended to diagnose MRSA infection nor to guide or monitor treatment for MRSA infections.          Radiology Studies: Dg Pelvis 1-2 Views  Result Date: 03/28/2016 CLINICAL DATA:  Patient fell tonight. Patient is noncommunicative and not complaining of pain. EXAM: PELVIS - 1-2 VIEW COMPARISON:  None. FINDINGS: There is no evidence of pelvic fracture or diastasis. No pelvic bone lesions are seen. Vascular calcifications. IMPRESSION: Negative. Electronically Signed   By: Burman NievesWilliam  Stevens M.D.   On: 03/28/2016 01:06   Ct Head Wo Contrast  Result Date: 03/27/2016 CLINICAL DATA:  Unwitnessed fall.  Patient does not recall fall. History of dementia, hypertension, diabetes, hyperlipidemia. EXAM: CT HEAD WITHOUT CONTRAST TECHNIQUE: Contiguous axial images were obtained from the base of the skull through the vertex without intravenous contrast. COMPARISON:  MRI of the brain May 07, 2015 FINDINGS: BRAIN: No intraparenchymal hemorrhage, mass effect nor midline shift. Moderate ventriculomegaly on the basis of global parenchymal brain volume loss. Multiple bilateral cerebellar, basal ganglia and thalamus lacunar infarcts. Advanced brainstem volume loss. Confluent supratentorial and pontine white matter hypodensities. No abnormal extra-axial fluid collections. Basal cisterns are patent. VASCULAR: Moderate calcific atherosclerosis of the carotid siphons. SKULL: No skull fracture. No significant scalp soft tissue swelling. SINUSES/ORBITS: Trace paranasal sinus mucosal thickening. Mastoid air cells are  well aerated. The included ocular globes and orbital contents are non-suspicious. OTHER: None. IMPRESSION: 1. No acute intracranial process. 2. Stable chronic changes including multiple old small infarcts and moderate to severe chronic small vessel ischemic disease with moderate brain atrophy. Electronically Signed   By: Awilda Metro M.D.   On: 03/27/2016 23:14      Scheduled Meds: . amLODipine  10 mg Oral Daily  . aspirin  81 mg Oral Q breakfast  . atorvastatin  80 mg Oral q1800  . baclofen  10 mg Oral QHS  . cholecalciferol  2,000 Units Oral Q breakfast  . divalproex  125 mg Oral BID  . donepezil  5 mg Oral QHS  . FLUoxetine  40 mg Oral Q breakfast  . heparin  5,000 Units Subcutaneous Q8H  . insulin detemir  5 Units Subcutaneous QHS  . memantine  5 mg Oral BID   Continuous Infusions: . sodium chloride 75 mL/hr at 03/28/16 0254     LOS: 0 days    Time spent in minutes: 35    Kassidee Narciso, MD [facility] Pager: [email or web address] Password [facility] 03/28/2016, 3:03 PM

## 2016-03-28 NOTE — ED Notes (Signed)
PRN hydralazine unverified by pharmacy; will contact them so it can be given for elevated BP

## 2016-03-28 NOTE — H&P (Signed)
History and Physical    Manuel Higgins ZOX:[id number] DOB: November 23, 1950 DOA: 03/27/2016  PCP: Mortimer Fries, PA  Patient coming from: Nursing home.  Chief Complaint: Fall.  HPI: Manuel Higgins is a 66 y.o. male with history of CVA, dementia, diabetes mellitus, hypertension was brought to the ER after patient had an unwitnessed fall at nursing home. Patient is a poor historian and is not able to give the exact details of his fall. Denies any pain. Denies any chest pain or shortness of breath.    ED Course: CT of the head was unremarkable. EKG shows normal sinus rhythm with LVH and QTC of 441 ms. Lab work revealed acute renal failure with mild hyperkalemia. Patient was given 500 mL normal saline bolus and admitted for further hydration and observation. Patient's blood pressure is also found to be markedly elevated.  Review of Systems: As per HPI, rest all negative.   Past Medical History:  Diagnosis Date  . Anemia   . Dementia    unspecified w/o behavioral disturbance  . Diabetes mellitus without complication (HCC)   . Dysphagia   . Hyperlipidemia   . Hypertension   . Hypertensive crisis 02/15/2013  . Stroke [facility]) 03/2013   Multiple infarcts  . Toe ulcer (HCC)     Past Surgical History:  Procedure Laterality Date  . BACK SURGERY    . LOOP RECORDER IMPLANT  04/08/2013   MDT LinQ implanted by Dr Ladona Ridgel for cryptogenic stroke  . LOOP RECORDER IMPLANT N/A 04/08/2013   Procedure: LOOP RECORDER IMPLANT;  Surgeon: Marinus Maw, MD;  Location: [facility] CATH LAB;  Service: Cardiovascular;  Laterality: N/A;  . TEE WITHOUT CARDIOVERSION N/A 04/07/2013   Procedure: TRANSESOPHAGEAL ECHOCARDIOGRAM (TEE);  Surgeon: Lars Masson, MD;  Location: [name] ENDOSCOPY;  Service: Cardiovascular;  Laterality: N/A;  . TONSILLECTOMY       reports that he quit smoking about 12 years ago. His smoking use included Cigarettes. He has a 3.30 pack-year smoking history. He has never used smokeless tobacco. He reports that  he does not drink alcohol or use drugs.  Allergies  Allergen Reactions  . Risperdal [Risperidone]     Unknown reaction per [facility]     Family History  Problem Relation Age of Onset  . Family history unknown: Yes    Prior to Admission medications   Medication Sig Start Date End Date Taking? Authorizing Provider  acetaminophen (TYLENOL) 500 MG tablet Take 500 mg by mouth every 4 (four) hours as needed for mild pain, moderate pain, fever or headache.   Yes Historical Provider, MD  alum & mag hydroxide-simeth (ALMACONE) 200-200-20 MG/5ML suspension Take 30 mLs by mouth as needed for indigestion or heartburn.   Yes Historical Provider, MD  aspirin 81 MG chewable tablet Chew 1 tablet (81 mg total) by mouth daily. Patient taking differently: Chew 81 mg by mouth daily with breakfast.  05/10/15  Yes Selina Cooley, MD  atorvastatin (LIPITOR) 80 MG tablet Take 1 tablet (80 mg total) by mouth daily at 6 PM. 05/10/15  Yes Selina Cooley, MD  baclofen (LIORESAL) 10 MG tablet Take 1 tablet (10 mg total) by mouth at bedtime. 05/10/15  Yes Selina Cooley, MD  Cholecalciferol (VITAMIN D) 2000 units CAPS Take 2,000 Units by mouth daily with breakfast.    Yes Historical Provider, MD  divalproex (DEPAKOTE SPRINKLE) 125 MG capsule Take 125 mg by mouth 2 (two) times daily.   Yes Historical Provider, MD  donepezil (ARICEPT) 5 MG tablet Take 5 mg  by mouth at bedtime.   Yes Historical Provider, MD  FLUoxetine (PROZAC) 40 MG capsule Take 40 mg by mouth daily with breakfast.   Yes Historical Provider, MD  guaifenesin (ROBITUSSIN) 100 MG/5ML syrup Take 200 mg by mouth every 6 (six) hours as needed for cough.   Yes Historical Provider, MD  insulin aspart (NOVOLOG FLEXPEN) 100 UNIT/ML FlexPen Inject 2-12 Units into the skin 3 (three) times daily with meals. Per sliding scale  150 - 200 = 2 units 201 - 250 = 4 units  251 - 300 = 6 units  301 - 350 = 8 units  351 - 400 = 10 units > 401 = 12 units and Call MD   Yes Historical  Provider, MD  Insulin Detemir (LEVEMIR FLEXPEN) 100 UNIT/ML Pen Inject 5 Units into the skin at bedtime.   Yes Historical Provider, MD  lisinopril (PRINIVIL,ZESTRIL) 10 MG tablet Take 1 tablet (10 mg total) by mouth daily. Patient taking differently: Take 10 mg by mouth daily with breakfast.  04/09/13  Yes Ripudeep K Rai, MD  loperamide (IMODIUM A-D) 2 MG tablet Take 2 mg by mouth as needed for diarrhea or loose stools.   Yes Historical Provider, MD  magnesium hydroxide (MILK OF MAGNESIA) 400 MG/5ML suspension Take 30 mLs by mouth at bedtime as needed for mild constipation.   Yes Historical Provider, MD  memantine (NAMENDA) 5 MG tablet Take 5 mg by mouth 2 (two) times daily.   Yes Historical Provider, MD  metFORMIN (GLUCOPHAGE) 500 MG tablet Take 500 mg by mouth 2 (two) times daily with a meal.   Yes Historical Provider, MD  neomycin-bacitracin-polymyxin (NEOSPORIN) ointment Apply 1 application topically as needed for wound care.   Yes Historical Provider, MD    Physical Exam: Vitals:   03/27/16 2152 03/27/16 2156  BP:  173/63  Pulse:  89  Resp:  16  Temp:  98.8 F (37.1 C)  TempSrc:  Oral  SpO2: 98% 97%  Weight:  76.7 kg (169 lb)  Height:  5\' 9"  (1.753 m)      Constitutional: Moderately built and nourished. Vitals:   03/27/16 2152 03/27/16 2156  BP:  173/63  Pulse:  89  Resp:  16  Temp:  98.8 F (37.1 C)  TempSrc:  Oral  SpO2: 98% 97%  Weight:  76.7 kg (169 lb)  Height:  5\' 9"  (1.753 m)   Eyes: Anicteric no pallor. ENMT: No discharge from the ears eyes nose or mouth. Neck: No mass felt. No neck rigidity. No JVD appreciated. Respiratory: No rhonchi or crepitations. Cardiovascular: S1 and S2 heard no murmurs appreciated. Abdomen: Soft nontender bowel sounds present. No guarding or rigidity. Musculoskeletal: No edema. No joint effusion. Skin: No rash. Skin appears warm. Neurologic: Alert awake oriented to his name and place. Moves all extremities. Psychiatric: Patient  appears mildly confused.   Labs on Admission: I have personally reviewed following labs and imaging studies  CBC:  Recent Labs Lab 03/27/16 2236  WBC 9.0  NEUTROABS 7.3  HGB 9.8*  HCT 29.1*  MCV 93.9  PLT 168   Basic Metabolic Panel:  Recent Labs Lab 03/27/16 2236  NA 141  K 5.2*  CL 108  CO2 26  GLUCOSE 131*  BUN 49*  CREATININE 2.20*  CALCIUM 9.2   GFR: Estimated Creatinine Clearance: 33.9 mL/min (by C-G formula based on SCr of 2.2 mg/dL (H)). Liver Function Tests: No results for input(s): AST, ALT, ALKPHOS, BILITOT, PROT, ALBUMIN in the last 168 hours.  No results for input(s): LIPASE, AMYLASE in the last 168 hours. No results for input(s): AMMONIA in the last 168 hours. Coagulation Profile: No results for input(s): INR, PROTIME in the last 168 hours. Cardiac Enzymes: No results for input(s): CKTOTAL, CKMB, CKMBINDEX, TROPONINI in the last 168 hours. BNP (last 3 results) No results for input(s): PROBNP in the last 8760 hours. HbA1C: No results for input(s): HGBA1C in the last 72 hours. CBG: No results for input(s): GLUCAP in the last 168 hours. Lipid Profile: No results for input(s): CHOL, HDL, LDLCALC, TRIG, CHOLHDL, LDLDIRECT in the last 72 hours. Thyroid Function Tests: No results for input(s): TSH, T4TOTAL, FREET4, T3FREE, THYROIDAB in the last 72 hours. Anemia Panel: No results for input(s): VITAMINB12, FOLATE, FERRITIN, TIBC, IRON, RETICCTPCT in the last 72 hours. Urine analysis:    Component Value Date/Time   COLORURINE YELLOW 03/27/2016 2236   APPEARANCEUR CLEAR 03/27/2016 2236   LABSPEC 1.018 03/27/2016 2236   PHURINE 5.0 03/27/2016 2236   GLUCOSEU NEGATIVE 03/27/2016 2236   HGBUR NEGATIVE 03/27/2016 2236   BILIRUBINUR NEGATIVE 03/27/2016 2236   KETONESUR NEGATIVE 03/27/2016 2236   PROTEINUR NEGATIVE 03/27/2016 2236   UROBILINOGEN 0.2 04/18/2013 2134   NITRITE NEGATIVE 03/27/2016 2236   LEUKOCYTESUR NEGATIVE 03/27/2016 2236   Sepsis  Labs: @LABRCNTIP (procalcitonin:4,lacticidven:4) )No results found for this or any previous visit (from the past 240 hour(s)).   Radiological Exams on Admission: Ct Head Wo Contrast  Result Date: 03/27/2016 CLINICAL DATA:  Unwitnessed fall. Patient does not recall fall. History of dementia, hypertension, diabetes, hyperlipidemia. EXAM: CT HEAD WITHOUT CONTRAST TECHNIQUE: Contiguous axial images were obtained from the base of the skull through the vertex without intravenous contrast. COMPARISON:  MRI of the brain May 07, 2015 FINDINGS: BRAIN: No intraparenchymal hemorrhage, mass effect nor midline shift. Moderate ventriculomegaly on the basis of global parenchymal brain volume loss. Multiple bilateral cerebellar, basal ganglia and thalamus lacunar infarcts. Advanced brainstem volume loss. Confluent supratentorial and pontine white matter hypodensities. No abnormal extra-axial fluid collections. Basal cisterns are patent. VASCULAR: Moderate calcific atherosclerosis of the carotid siphons. SKULL: No skull fracture. No significant scalp soft tissue swelling. SINUSES/ORBITS: Trace paranasal sinus mucosal thickening. Mastoid air cells are well aerated. The included ocular globes and orbital contents are non-suspicious. OTHER: None. IMPRESSION: 1. No acute intracranial process. 2. Stable chronic changes including multiple old small infarcts and moderate to severe chronic small vessel ischemic disease with moderate brain atrophy. Electronically Signed   By: Awilda Metroourtnay  Bloomer M.D.   On: 03/27/2016 23:14    EKG: Independently reviewed. Normal sinus rhythm with LVH and QTC of 441 ms.  Assessment/Plan Principal Problem:   ARF (acute renal failure) (HCC) Active Problems:   Essential hypertension   Diabetes mellitus type 2 in nonobese (HCC)   Fall    1. Acute renal failure - cause not clear. Patient denies having any diarrhea vomiting or poor oral intake. Note that patient is a poor historian. Will  discontinue lisinopril and keep patient on gentle hydration and closely follow metabolic panel. Check FENa. 2. Fall unwitnessed - will get physical therapy consult. X-ray pelvis pending. 3. Hypertension uncontrolled - due to acute renal failure will hold off lisinopril. I have placed patient on hydralazine for systolic blood pressure more than 160 and added amlodipine 5 mg by mouth daily. 4. History of stroke on aspirin and statins. 5. History of dementia with possible bipolar - on Aricept and Namenda and Depakote. Check Depakote levels. 6. Diabetes mellitus type 2 - hold metformin  due to acute renal failure. Will place patient on sliding scale coverage. 7. Chronic anemia - follow CBC.   DVT prophylaxis: Lovenox. Code Status: Full code.  Family Communication: No family at the bedside.  Disposition Plan: Back to nursing home.  Consults called: Physical therapy.  Admission status: Observation.    Eduard Clos MD [facility] Pager [phone].  If 7PM-7AM, please contact night-coverage [email or web address] Password [facility]  03/28/2016, 12:39 AM

## 2016-03-28 NOTE — Clinical Social Work Note (Signed)
Clinical Social Work Assessment  Patient Details  Name: Manuel Higgins MRN: [id number] Date of Birth: 11/15/1950  Date of referral:  03/28/16               Reason for consult:  Facility Placement                Permission sought to share information with:  Facility Industrial/product designerContact Representative Permission granted to share information::  Yes, Verbal Permission Granted  Name::        Agency::     Relationship::     Contact Information:     Housing/Transportation Living arrangements for the past 2 months:  Assisted Living Facility Source of Information:  Patient Patient Interpreter Needed:  None Criminal Activity/Legal Involvement Pertinent to Current Situation/Hospitalization:  No - Comment as needed Significant Relationships:  Other Family Members Lives with:  Facility Resident Do you feel safe going back to the place where you live?  Yes Need for family participation in patient care:  No (Coment)  Care giving concerns:  Patient from [facility] and plans to return at discharge.    Social Worker assessment / plan:  CSW spoke with patient at bedside. Patient reports that he has been staying at [facility] for the past 3 months. Patient reports that [place] has family [place] that visit him at the facility. Patient reports that he [facility] to [facility]. CSW will follow patient for discharge.   Employment status:  Disabled (Comment on whether or not currently receiving Disability) Insurance information[place]  [place] Recommendations[place]  Not assessed at this time Information / Referral to community resources:  Other (Comment Required) (Assisted Living Facility )  Patient/Family's Response to care:  Patient is agreeable to returning to ALF, reports no calm.   Patient/Family's Understanding of and Emotional Response to Diagnosis, Current Treatment, and Prognosis:  Patient reports that he had a fall prior to being admitted to hospital. Patient reports that he does not  have falls often. Patient calm and stable when speaking with CSW. Patient reports that he has support from his family and has no concerns about returning to ALF.   Emotional Assessment Appearance:  Appears stated age Attitude/Demeanor/Rapport:    Affect (typically observed):  Calm Orientation:  Oriented to Self, Oriented to Situation, Oriented to Place Alcohol / Substance use:  Not Applicable Psych involvement (Current and /or in the community):  No (Comment)  Discharge Needs  Concerns to be addressed:  No discharge needs identified Readmission within the last 30 days:  No Current discharge risk:  None Barriers to Discharge:  No Barriers Identified   Antionette PolesKimberly L Omarii Scalzo, LCSW 03/28/2016, 10:36 AM

## 2016-03-29 DIAGNOSIS — W19XXXD Unspecified fall, subsequent encounter: Secondary | ICD-10-CM

## 2016-03-29 DIAGNOSIS — E119 Type 2 diabetes mellitus without complications: Secondary | ICD-10-CM

## 2016-03-29 LAB — CBC WITH DIFFERENTIAL/PLATELET
BASOS PCT: 0 %
Basophils Absolute: 0 10*3/uL (ref 0.0–0.1)
EOS ABS: 0.1 10*3/uL (ref 0.0–0.7)
EOS PCT: 1 %
HCT: 34 % — ABNORMAL LOW (ref 39.0–52.0)
HEMOGLOBIN: 11.4 g/dL — AB (ref 13.0–17.0)
LYMPHS ABS: 1.8 10*3/uL (ref 0.7–4.0)
Lymphocytes Relative: 26 %
MCH: 31.5 pg (ref 26.0–34.0)
MCHC: 33.5 g/dL (ref 30.0–36.0)
MCV: 93.9 fL (ref 78.0–100.0)
MONOS PCT: 13 %
Monocytes Absolute: 0.9 10*3/uL (ref 0.1–1.0)
Neutro Abs: 4.3 10*3/uL (ref 1.7–7.7)
Neutrophils Relative %: 60 %
PLATELETS: 167 10*3/uL (ref 150–400)
RBC: 3.62 MIL/uL — ABNORMAL LOW (ref 4.22–5.81)
RDW: 13.7 % (ref 11.5–15.5)
WBC: 7.1 10*3/uL (ref 4.0–10.5)

## 2016-03-29 LAB — COMPREHENSIVE METABOLIC PANEL
ALK PHOS: 96 U/L (ref 38–126)
ALT: 15 U/L — AB (ref 17–63)
AST: 20 U/L (ref 15–41)
Albumin: 3.9 g/dL (ref 3.5–5.0)
Anion gap: 8 (ref 5–15)
BUN: 20 mg/dL (ref 6–20)
CALCIUM: 9.1 mg/dL (ref 8.9–10.3)
CO2: 26 mmol/L (ref 22–32)
CREATININE: 1.25 mg/dL — AB (ref 0.61–1.24)
Chloride: 103 mmol/L (ref 101–111)
GFR, EST NON AFRICAN AMERICAN: 59 mL/min — AB (ref >60)
Glucose, Bld: 151 mg/dL — ABNORMAL HIGH (ref 65–99)
Potassium: 4.6 mmol/L (ref 3.5–5.1)
SODIUM: 137 mmol/L (ref 135–145)
Total Bilirubin: 0.5 mg/dL (ref 0.3–1.2)
Total Protein: 7.3 g/dL (ref 6.5–8.1)

## 2016-03-29 LAB — PHOSPHORUS: Phosphorus: 3.6 mg/dL (ref 2.5–4.6)

## 2016-03-29 LAB — GLUCOSE, CAPILLARY
GLUCOSE-CAPILLARY: 78 mg/dL (ref 65–99)
Glucose-Capillary: 181 mg/dL — ABNORMAL HIGH (ref 65–99)
Glucose-Capillary: 130 mg/dL — ABNORMAL HIGH (ref 65–99)
Glucose-Capillary: 182 mg/dL — ABNORMAL HIGH (ref 65–99)

## 2016-03-29 LAB — MAGNESIUM: MAGNESIUM: 1.5 mg/dL — AB (ref 1.7–2.4)

## 2016-03-29 MED ORDER — SODIUM CHLORIDE 0.9 % IV SOLN
INTRAVENOUS | Status: DC
  Administered 2016-03-29 – 2016-03-30 (×2): via INTRAVENOUS

## 2016-03-29 NOTE — Progress Notes (Signed)
PROGRESS NOTE    Manuel Higgins  WJX:[id number] DOB: 05/22/1950 DOA: 03/27/2016 PCP: Mortimer Friesavid Curl, PA   Brief Narrative: Manuel EmperorDarrell Diggsis a 66 y.o.malefrom ALF with history of CVA, dementia, diabetes mellitus, hypertension was brought to the ER after patient had an unwitnessed fall at nursing home. Patient is a poor historian and is not able to give the exact details of his fall. Lab work revealed acute renal failure with mild hyperkalemia. Orthostatics positive and BP quite elevated.Patient was given 500 mL normal saline bolus and admitted for further hydration and observation. BUN/Cr is slowly improving however labs were not able to be obtained today. Reassess Labs in AM  Assessment & Plan:   Principal Problem:   ARF (acute renal failure) (HCC) Active Problems:   Essential hypertension   Diabetes mellitus type 2 in nonobese (HCC)   Fall   ARF (acute renal failure)/AKI- prerenal - baseline Cr 1.0- 1.3; Repeat Labwork not drawn today - cont IVF at 75 mL/hr- holding Lisinopril - Cr slightly improved; Repeat CMP in AM - stop Aricept - follow oral fluid intake   Fall likely 2/2 to Orthostatic Hypotension - Pelvic X-Ray Negative - likely due to orthostasis - Continue to Hydrate - PT Recommneding SNF - Repeat Orthostatics in AM  Hyperkalemia - resolved with hydration  Essential Hypertension - C/w Amlodipine 10 mg and given a PRN Hydralazine - Lisinopril on hold- will add Hydralazine 25 mg q8h   Diabetes mellitus type 2 in nonobese  - last A1c 7.3 recently  - C/w Sensitive Novolog SSI and with Levemir 5 mg sq Daily qHS  Dementia with Bipolar Disorder - D/c'd Aricept - Cont Depakote 125 mg po BID and Memantine 5 mg po BID  Hx of CVA -C/w Aspirin 81 mg and Atorvastatin 80 mg po qHS -C/w Baclofen 10 mg po Daily at bedtime for Spasticity   DVT prophylaxis: Heparin 5,000 sq q8h Code Status: FULL CODE Family Communication: No Family present at bedside Disposition Plan:  SNF  Consultants:   None   Procedures:  None   Antimicrobials:  Anti-infectives    None     Subjective: Seen and examined and [facility] in the [facility]. No Nausea or vomitng and resting well. Denied any other complaints.   Objective: Vitals:   03/28/16 0815 03/28/16 1038 03/28/16 2046 03/29/16 0544  BP: (!) 200/80 (!) 141/65 (!) 176/69 (!) 160/74  Pulse:   74 69  Resp:   18 18  Temp:   98.3 F (36.8 C) 98.1 F (36.7 C)  TempSrc:   Oral Oral  SpO2:   98% 99%  Weight:      Height:        Intake/Output Summary (Last 24 hours) at 03/29/16 0820 Last data filed at 03/29/16 0615  Gross per 24 hour  Intake           2232.5 ml  Output             3400 ml  Net          -1167.5 ml   Filed Weights   03/27/16 2156 03/28/16 0151  Weight: 76.7 kg (169 lb) 76.5 kg (168 lb 10.4 oz)   Examination: Physical Exam:  Constitutional:  NAD and appears calm and comfortable Eyes: Lids and conjunctivae normal, sclerae anicteric  ENMT: External Ears, Nose appear normal. Grossly normal hearing.  Neck: Appears normal, supple, no cervical masses, normal ROM, no appreciable thyromegaly Respiratory: Diminished to auscultation bilaterally, no wheezing, rales, rhonchi or  crackles. Normal respiratory effort and patient is not tachypenic. No accessory muscle use.  Cardiovascular: RRR, no murmurs / rubs / gallops. S1 and S2 auscultated. No extremity edema.  Abdomen: Soft, non-tender, non-distended. No masses palpated. No appreciable hepatosplenomegaly. Bowel sounds positive x4.  GU: Deferred. Musculoskeletal: No clubbing / cyanosis of digits/nails. No joint deformity upper and lower extremities.   Skin: No rashes, lesions, ulcers. No induration; Warm and dry.  Neurologic: CN 2-12 grossly intact with no focal deficits. Romberg sign cerebellar reflexes not assessed.  Psychiatric: Normal judgment and insight. Alert and an awake, knew he was in the hospital. Normal mood and appropriate  affect.   Data Reviewed: I have personally reviewed following labs and imaging studies  CBC:  Recent Labs Lab 03/27/16 2236 03/28/16 0146  WBC 9.0 8.8  NEUTROABS 7.3  --   HGB 9.8* 9.8*  HCT 29.1* 29.0*  MCV 93.9 93.5  PLT 168 164   Basic Metabolic Panel:  Recent Labs Lab 03/27/16 2236 03/28/16 0146  NA 141 141  K 5.2* 4.7  CL 108 108  CO2 26 25  GLUCOSE 131* 118*  BUN 49* 48*  CREATININE 2.20* 1.83*  CALCIUM 9.2 9.0   GFR: Estimated Creatinine Clearance: 40.8 mL/min (by C-G formula based on SCr of 1.83 mg/dL (H)). Liver Function Tests: No results for input(s): AST, ALT, ALKPHOS, BILITOT, PROT, ALBUMIN in the last 168 hours. No results for input(s): LIPASE, AMYLASE in the last 168 hours. No results for input(s): AMMONIA in the last 168 hours. Coagulation Profile: No results for input(s): INR, PROTIME in the last 168 hours. Cardiac Enzymes:  Recent Labs Lab 03/28/16 0146  CKTOTAL 215  TROPONINI <0.03   BNP (last 3 results) No results for input(s): PROBNP in the last 8760 hours. HbA1C: No results for input(s): HGBA1C in the last 72 hours. CBG:  Recent Labs Lab 03/28/16 1737 03/28/16 2054 03/29/16 0739  GLUCAP 104* 130* 78   Lipid Profile: No results for input(s): CHOL, HDL, LDLCALC, TRIG, CHOLHDL, LDLDIRECT in the last 72 hours. Thyroid Function Tests: No results for input(s): TSH, T4TOTAL, FREET4, T3FREE, THYROIDAB in the last 72 hours. Anemia Panel: No results for input(s): VITAMINB12, FOLATE, FERRITIN, TIBC, IRON, RETICCTPCT in the last 72 hours. Sepsis Labs: No results for input(s): PROCALCITON, LATICACIDVEN in the last 168 hours.  Recent Results (from the past 240 hour(s))  MRSA PCR Screening     Status: None   Collection Time: 03/28/16  3:36 AM  Result Value Ref Range Status   MRSA by PCR NEGATIVE NEGATIVE Final    Comment:        The GeneXpert MRSA Assay (FDA approved for NASAL specimens only), is one component of a comprehensive  MRSA colonization surveillance program. It is not intended to diagnose MRSA infection nor to guide or monitor treatment for MRSA infections.     Radiology Studies: Dg Pelvis 1-2 Views  Result Date: 03/28/2016 CLINICAL DATA:  Patient fell tonight. Patient is noncommunicative and not complaining of pain. EXAM: PELVIS - 1-2 VIEW COMPARISON:  None. FINDINGS: There is no evidence of pelvic fracture or diastasis. No pelvic bone lesions are seen. Vascular calcifications. IMPRESSION: Negative. Electronically Signed   By: Burman Nieves M.D.   On: 03/28/2016 01:06   Ct Head Wo Contrast  Result Date: 03/27/2016 CLINICAL DATA:  Unwitnessed fall. Patient does not recall fall. History of dementia, hypertension, diabetes, hyperlipidemia. EXAM: CT HEAD WITHOUT CONTRAST TECHNIQUE: Contiguous axial images were obtained from the base of the skull  through the vertex without intravenous contrast. COMPARISON:  MRI of the brain May 07, 2015 FINDINGS: BRAIN: No intraparenchymal hemorrhage, mass effect nor midline shift. Moderate ventriculomegaly on the basis of global parenchymal brain volume loss. Multiple bilateral cerebellar, basal ganglia and thalamus lacunar infarcts. Advanced brainstem volume loss. Confluent supratentorial and pontine white matter hypodensities. No abnormal extra-axial fluid collections. Basal cisterns are patent. VASCULAR: Moderate calcific atherosclerosis of the carotid siphons. SKULL: No skull fracture. No significant scalp soft tissue swelling. SINUSES/ORBITS: Trace paranasal sinus mucosal thickening. Mastoid air cells are well aerated. The included ocular globes and orbital contents are non-suspicious. OTHER: None. IMPRESSION: 1. No acute intracranial process. 2. Stable chronic changes including multiple old small infarcts and moderate to severe chronic small vessel ischemic disease with moderate brain atrophy. Electronically Signed   By: Awilda Metro M.D.   On: 03/27/2016 23:14    Scheduled Meds: . amLODipine  10 mg Oral Daily  . aspirin  81 mg Oral Q breakfast  . atorvastatin  80 mg Oral q1800  . baclofen  10 mg Oral QHS  . cholecalciferol  2,000 Units Oral Q breakfast  . divalproex  125 mg Oral BID  . FLUoxetine  40 mg Oral Q breakfast  . heparin  5,000 Units Subcutaneous Q8H  . hydrALAZINE  25 mg Oral Q8H  . insulin aspart  0-9 Units Subcutaneous TID WC  . insulin detemir  5 Units Subcutaneous QHS  . memantine  5 mg Oral BID   Continuous Infusions:   LOS: 1 day   Merlene Laughter, DO [facility] Pager [phone]  If 7PM-7AM, please contact night-coverage [email or web address] Password [email or web address] 03/29/2016, 8:20 AM

## 2016-03-29 NOTE — NC FL2 (Addendum)
[place] MEDICAID FL2 LEVEL OF CARE SCREENING TOOL     IDENTIFICATION  Patient Name: Manuel Higgins Birthdate: 10/08/1950 Sex: male Admission Date (Current Location): 03/27/2016  [place] [id number] M Facility and Address:  [facility],  [address], [place] [id number]      Provider Number: [id number]  Attending Physician Name and Address:  Merlene Laughtermair Latif Sheikh, DO  Relative Name and Phone Number:       Current Level of Care: Hospital Recommended Level of Care: Special Care Unit Prior Approval Number:    Date Approved/Denied:   PASRR Number: [id number]  Discharge Plan: Special Care Unit    Current Diagnoses: Patient Active Problem List   Diagnosis Date Noted  . Diabetes mellitus type 2 in nonobese (HCC) 03/28/2016  . Fall 03/28/2016  . ARF (acute renal failure) (HCC) 03/27/2016  . 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 Hypokalemia 02/14/2016  . Leukocytosis 02/14/2016  . Altered mental status 05/07/2015  . History of embolic stroke 05/07/2015  . History of CVA with residual deficit 05/07/2015  . Acute encephalopathy 05/07/2015  . Essential hypertension 04/24/2014  . HLD (hyperlipidemia) 04/24/2014  . Dysphagia 04/04/2013  . Callus of foot 02/16/2013    Orientation RESPIRATION BLADDER Height & Weight     Self, Situation, Time  Normal Incontinent Weight: 168 lb 10.4 oz (76.5 kg) Height:  5\' 9"  (175.3 cm)  BEHAVIORAL SYMPTOMS/MOOD NEUROLOGICAL BOWEL NUTRITION STATUS        No added table salt  AMBULATORY STATUS COMMUNICATION OF NEEDS Skin   Limited Assist Verbally Normal                       Personal Care Assistance Level of Assistance  Bathing, Feeding, Dressing Bathing Assistance: Limited assistance Feeding assistance: Independent Dressing Assistance: Limited assistance     Functional Limitations Info             SPECIAL CARE FACTORS FREQUENCY  PT (By  licensed PT), OT (By licensed OT)     PT Frequency: 3x OT Frequency: 3x            Contractures Contractures Info: Not present    Additional Factors Info  Code Status, Allergies Code Status Info: Full Code  Allergies Info: Risperdal Risperidone            Discharge Medications: acetaminophen 500 MG tablet Commonly known as:  TYLENOL Take 500 mg by mouth every 4 (four) hours as needed for mild pain, moderate pain, fever or headache.   ALMACONE 200-200-20 MG/5ML suspension Generic drug:  alum & mag hydroxide-simeth Take 30 mLs by mouth as needed for indigestion or heartburn.   amLODipine 10 MG tablet Commonly known as:  NORVASC Take 1 tablet (10 mg total) by mouth daily. Start taking on:  03/31/2016   aspirin 81 MG chewable tablet Chew 1 tablet (81 mg total) by mouth daily. What changed:  when to take this   atorvastatin 80 MG tablet Commonly known as:  LIPITOR Take 1 tablet (80 mg total) by mouth daily at 6 PM.   baclofen 10 MG tablet Commonly known as:  LIORESAL Take 1 tablet (10 mg total) by mouth at bedtime.   divalproex 125 MG capsule Commonly known as:  DEPAKOTE SPRINKLE Take 125 mg by mouth 2 (two) times daily.   donepezil 5 MG tablet Commonly known as:  ARICEPT Take 5 mg by mouth at bedtime.   FLUoxetine 40  MG capsule Commonly known as:  PROZAC Take 40 mg by mouth daily with breakfast.   guaifenesin 100 MG/5ML syrup Commonly known as:  ROBITUSSIN Take 200 mg by mouth every 6 (six) hours as needed for cough.   hydrALAZINE 25 MG tablet Commonly known as:  APRESOLINE Take 1 tablet (25 mg total) by mouth every 8 (eight) hours.   LEVEMIR FLEXPEN 100 UNIT/ML Pen Generic drug:  Insulin Detemir Inject 5 Units into the skin at bedtime.   loperamide 2 MG tablet Commonly known as:  IMODIUM A-D Take 2 mg by mouth as needed for diarrhea or loose stools.   magnesium hydroxide 400 MG/5ML suspension Commonly known as:  MILK OF  MAGNESIA Take 30 mLs by mouth at bedtime as needed for mild constipation.   memantine 5 MG tablet Commonly known as:  NAMENDA Take 5 mg by mouth 2 (two) times daily.   metFORMIN 500 MG tablet Commonly known as:  GLUCOPHAGE Take 500 mg by mouth 2 (two) times daily with a meal.   neomycin-bacitracin-polymyxin ointment Commonly known as:  NEOSPORIN Apply 1 application topically as needed for wound care.   NOVOLOG FLEXPEN 100 UNIT/ML FlexPen Generic drug:  insulin aspart Inject 2-12 Units into the skin 3 (three) times daily with meals. Per sliding scale  150 - 200 = 2 units 201 - 250 = 4 units  251 - 300 = 6 units  301 - 350 = 8 units  351 - 400 = 10 units > 401 = 12 units and Call MD   Vitamin D 2000 units Caps Take 2,000 Units by mouth daily with breakfast.     Relevant Imaging Results:  Relevant Lab Results:   Additional Information SSN [id number]  Antionette Poles, LCSW

## 2016-03-29 NOTE — Care Management Note (Signed)
Case Management Note  Patient Details  Name: Manuel Higgins MRN: [id number] Date of Birth: 12/13/1950  Subjective/Objective:       a 66 y.o.malefrom ALF with history of CVA, dementia, diabetes mellitus, hypertension was brought to the ER after patient had an unwitnessed fall at nursing home. Patient is a poor historian and is not able to give the exact details of his fall. Lab work revealed acute renal failure with mild hyperkalemia. Orthostatics positive and BP quite elevated.              Action/Plan: PT to eval for SNF or ALF   Expected Discharge Date:    03/31/16              Expected Discharge Plan:  Skilled Nursing Facility  In-House Referral:  Clinical Social Work  Discharge planning Services  CM Consult  Post Acute Care Choice:  NA Choice offered to:  NA  DME Arranged:  N/A DME Agency:  NA  HH Arranged:    HH Agency:  NA  Status of Service:  In process, will continue to follow  If discussed at Long Length of Stay Meetings, dates discussed:    Additional CommentsGeni Bers:  Ximena Todaro, RN 03/29/2016, 11:56 AM

## 2016-03-29 NOTE — Evaluation (Signed)
Physical Therapy Evaluation Patient Details Name: Manuel Higgins MRN: [id number] DOB: 06/24/1950 Today's Date: 03/29/2016   History of Present Illness  [facility] s/p fall wtih dehydration and orthostatic.  Pt with hx of CVA (15), DM, dementia, and back surgery  Clinical Impression  Pt admitted as above and presenting with functional mobility limitations 2* generalized weakness, balance deficits and dementia related cognition.  Pt would benefit from return to previous living arrangement for 24/7 assist.    Follow Up Recommendations SNF    Equipment Recommendations  None recommended by PT    Recommendations for Other Services       Precautions / Restrictions Precautions Precautions: Fall Restrictions Weight Bearing Restrictions: No      Mobility  Bed Mobility Overal bed mobility: Needs Assistance Bed Mobility: Supine to Sit;Sit to Supine     Supine to sit: Supervision Sit to supine: Supervision   General bed mobility comments: increased time and use of rail  Transfers Overall transfer level: Needs assistance Equipment used: Rolling walker (2 wheeled) Transfers: Sit to/from Stand Sit to Stand: Min assist;From elevated surface         General transfer comment: cues for safe transition position and use of UEs to self assist  Ambulation/Gait Ambulation/Gait assistance: Min assist Ambulation Distance (Feet): 31 Feet Assistive device: Rolling walker (2 wheeled) Gait Pattern/deviations: Step-through pattern;Decreased step length - right;Decreased step length - left;Shuffle;Trunk flexed Gait velocity: decr Gait velocity interpretation: Below normal speed for age/gender General Gait Details: cues for posture and position from [employer]  Stairs            Wheelchair Mobility    Modified Rankin (Stroke Patients Only)       Balance Overall balance assessment: Needs assistance Sitting-balance support: Feet supported;No upper extremity  supported Sitting balance-Leahy Scale: Good     Standing balance support: Bilateral upper extremity supported Standing balance-Leahy Scale: Poor                               Pertinent Vitals/Pain Pain Assessment: No/denies pain    Home Living Family/patient expects to be discharged to:: Assisted living                 Additional Comments: [facility]    Prior Function Level of Independence: Needs assistance         Comments: Pt is not reliable historian     Hand Dominance   Dominant Hand: Right    Extremity/Trunk Assessment   Upper Extremity Assessment Upper Extremity Assessment: Generalized weakness    Lower Extremity Assessment Lower Extremity Assessment: Generalized weakness       Communication   Communication: Expressive difficulties  Cognition Arousal/Alertness: Awake/alert Behavior During Therapy: Flat affect Overall Cognitive Status: History of cognitive impairments - at baseline                      General Comments      Exercises     Assessment/Plan    PT Assessment Patient needs continued PT services  PT Problem List Decreased strength;Decreased activity tolerance;Decreased balance;Decreased mobility;Decreased knowledge of use of DME       PT Treatment Interventions DME instruction;Gait training;Stair training;Functional mobility training;Therapeutic activities;Therapeutic exercise;Patient/family education    PT Goals (Current goals can be found in the Care Plan section)  Acute Rehab PT Goals Patient Stated Goal: Get back to bed to watch TV PT Goal Formulation: With patient  Time For Goal Achievement: 04/12/16 Potential to Achieve Goals: Good    Frequency Min 3X/week   Barriers to discharge        Co-evaluation               End of Session Equipment Utilized During Treatment: Gait belt Activity Tolerance: Patient tolerated treatment well Patient left: in bed;with call bell/phone within  reach;with bed alarm set Nurse Communication: Mobility status PT Visit Diagnosis: Unsteadiness on feet (R26.81)         Time: 1610-9604 PT Time Calculation (min) (ACUTE ONLY): 17 min   Charges:   PT Evaluation $PT Eval Low Complexity: 1 Procedure     PT G Codes:         Farhiya Rosten 03/29/2016, 12:54 PM

## 2016-03-30 LAB — CBC WITH DIFFERENTIAL/PLATELET
Basophils Absolute: 0 10*3/uL (ref 0.0–0.1)
Basophils Relative: 0 %
EOS ABS: 0.1 10*3/uL (ref 0.0–0.7)
Eosinophils Relative: 2 %
HEMATOCRIT: 29.9 % — AB (ref 39.0–52.0)
HEMOGLOBIN: 9.9 g/dL — AB (ref 13.0–17.0)
LYMPHS ABS: 1.9 10*3/uL (ref 0.7–4.0)
LYMPHS PCT: 31 %
MCH: 30.6 pg (ref 26.0–34.0)
MCHC: 33.1 g/dL (ref 30.0–36.0)
MCV: 92.3 fL (ref 78.0–100.0)
Monocytes Absolute: 0.8 10*3/uL (ref 0.1–1.0)
Monocytes Relative: 12 %
NEUTROS ABS: 3.3 10*3/uL (ref 1.7–7.7)
NEUTROS PCT: 55 %
Platelets: 151 10*3/uL (ref 150–400)
RBC: 3.24 MIL/uL — AB (ref 4.22–5.81)
RDW: 13.7 % (ref 11.5–15.5)
WBC: 6 10*3/uL (ref 4.0–10.5)

## 2016-03-30 LAB — COMPREHENSIVE METABOLIC PANEL
ALK PHOS: 75 U/L (ref 38–126)
ALT: 13 U/L — AB (ref 17–63)
AST: 17 U/L (ref 15–41)
Albumin: 3.3 g/dL — ABNORMAL LOW (ref 3.5–5.0)
Anion gap: 7 (ref 5–15)
BILIRUBIN TOTAL: 0.5 mg/dL (ref 0.3–1.2)
BUN: 18 mg/dL (ref 6–20)
CALCIUM: 8.4 mg/dL — AB (ref 8.9–10.3)
CO2: 24 mmol/L (ref 22–32)
CREATININE: 1.07 mg/dL (ref 0.61–1.24)
Chloride: 106 mmol/L (ref 101–111)
GFR calc Af Amer: >60 mL/min (ref >60)
GFR calc non Af Amer: >60 mL/min (ref >60)
Glucose, Bld: 139 mg/dL — ABNORMAL HIGH (ref 65–99)
Potassium: 4.2 mmol/L (ref 3.5–5.1)
SODIUM: 137 mmol/L (ref 135–145)
Total Protein: 5.8 g/dL — ABNORMAL LOW (ref 6.5–8.1)

## 2016-03-30 LAB — GLUCOSE, CAPILLARY
Glucose-Capillary: 127 mg/dL — ABNORMAL HIGH (ref 65–99)
Glucose-Capillary: 111 mg/dL — ABNORMAL HIGH (ref 65–99)
Glucose-Capillary: 136 mg/dL — ABNORMAL HIGH (ref 65–99)

## 2016-03-30 LAB — PHOSPHORUS: PHOSPHORUS: 3.2 mg/dL (ref 2.5–4.6)

## 2016-03-30 LAB — MAGNESIUM: Magnesium: 1.5 mg/dL — ABNORMAL LOW (ref 1.7–2.4)

## 2016-03-30 MED ORDER — AMLODIPINE BESYLATE 10 MG PO TABS: 10.0000 mg | ORAL_TABLET | Freq: Every day | ORAL | 0 refills | Status: DC

## 2016-03-30 MED ORDER — HYDRALAZINE HCL 25 MG PO TABS: 25.0000 mg | ORAL_TABLET | Freq: Three times a day (TID) | ORAL | 0 refills | Status: DC

## 2016-03-30 MED ORDER — SODIUM CHLORIDE 0.9 % IV BOLUS (SEPSIS)
500.0000 mL | Freq: Once | INTRAVENOUS | Status: AC
  Administered 2016-03-30: 500 mL via INTRAVENOUS

## 2016-03-30 NOTE — NC FL2 (Signed)
[place] MEDICAID FL2 LEVEL OF CARE SCREENING TOOL     IDENTIFICATION  Patient Name: Manuel Higgins Birthdate: 30-Jul-1950 Sex: male Admission Date (Current Location): 03/27/2016  [place] Number:  [place] [id number] M Facility and Address:  [facility],  [address] [place], [place] [address]      Provider Number: [id number]  Attending Physician Name and Address:  Merlene Laughter, DO  Relative Name and Phone Number:       Current Level of Care: Hospital Recommended Level of Care: SNF Prior Approval Number:    Date Approved/Denied:   PASRR Number:   Discharge Plan: SNF   Current Diagnoses: Patient Active Problem List   Diagnosis Date Noted  . Diabetes mellitus type 2 in nonobese (HCC) 03/28/2016  . Fall 03/28/2016  . ARF (acute renal failure) (HCC) 03/27/2016  . 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 Hypokalemia 02/14/2016  . Leukocytosis 02/14/2016  . Altered mental status 05/07/2015  . History of embolic stroke 05/07/2015  . History of CVA with residual deficit 05/07/2015  . Acute encephalopathy 05/07/2015  . Essential hypertension 04/24/2014  . HLD (hyperlipidemia) 04/24/2014  . Dysphagia 04/04/2013  . Callus of foot 02/16/2013    Orientation RESPIRATION BLADDER Height & Weight     Self, Situation, Time  Normal Incontinent Weight: 160 lb 0.9 oz (72.6 kg) Height:  5\' 9"  (175.3 cm)  BEHAVIORAL SYMPTOMS/MOOD NEUROLOGICAL BOWEL NUTRITION STATUS        Diet (heart healthy/carb modified)  AMBULATORY STATUS COMMUNICATION OF NEEDS Skin   Limited Assist Verbally Normal                       Personal Care Assistance Level of Assistance  Bathing, Feeding, Dressing Bathing Assistance: Limited assistance Feeding assistance: Independent Dressing Assistance: Limited assistance     Functional Limitations Info             SPECIAL CARE FACTORS FREQUENCY  PT (By licensed PT), OT (By licensed  OT)     Contractures Contractures Info: Not present    Additional Factors Info  Code Status, Allergies Code Status Info: Full Code  Allergies Info: Risperdal Risperidone           Current Medications (03/30/2016):  This is the current hospital active medication list Current Facility-Administered Medications  Medication Dose Route Frequency Provider Last Rate Last Dose  . 0.9 %  sodium chloride infusion   Intravenous Continuous Heloise Beecham Sheikh, DO 75 mL/hr at 03/30/16 1132    . acetaminophen (TYLENOL) tablet 650 mg  650 mg Oral Q6H PRN Eduard Clos, MD       Or  . acetaminophen (TYLENOL) suppository 650 mg  650 mg Rectal Q6H PRN Eduard Clos, MD      . amLODipine (NORVASC) tablet 10 mg  10 mg Oral Daily Calvert Cantor, MD   10 mg at 03/30/16 0901  . aspirin chewable tablet 81 mg  81 mg Oral Q breakfast Eduard Clos, MD   81 mg at 03/30/16 0720  . atorvastatin (LIPITOR) tablet 80 mg  80 mg Oral q1800 Eduard Clos, MD   80 mg at 03/29/16 1729  . baclofen (LIORESAL) tablet 10 mg  10 mg Oral QHS Eduard Clos, MD   10 mg at 03/29/16 2212  . cholecalciferol (VITAMIN D) tablet 2,000 Units  2,000 Units Oral Q breakfast Eduard Clos, MD   2,000 Units at 03/30/16 0720  .  divalproex (DEPAKOTE SPRINKLE) capsule 125 mg  125 mg Oral BID Eduard ClosArshad N Kakrakandy, MD   125 mg at 03/30/16 0901  . FLUoxetine (PROZAC) capsule 40 mg  40 mg Oral Q breakfast Eduard ClosArshad N Kakrakandy, MD   40 mg at 03/30/16 0720  . guaiFENesin (ROBITUSSIN) 100 MG/5ML solution 200 mg  200 mg Oral Q6H PRN Eduard ClosArshad N Kakrakandy, MD      . heparin injection 5,000 Units  5,000 Units Subcutaneous Q8H Eduard ClosArshad N Kakrakandy, MD   5,000 Units at 03/30/16 0545  . hydrALAZINE (APRESOLINE) injection 10 mg  10 mg Intravenous Q4H PRN Eduard ClosArshad N Kakrakandy, MD   10 mg at 03/28/16 0837  . hydrALAZINE (APRESOLINE) tablet 25 mg  25 mg Oral Q8H Calvert CantorSaima Rizwan, MD   25 mg at 03/30/16 0545  . insulin aspart (novoLOG)  injection 0-9 Units  0-9 Units Subcutaneous TID WC Calvert CantorSaima Rizwan, MD   1 Units at 03/30/16 0807  . insulin detemir (LEVEMIR) injection 5 Units  5 Units Subcutaneous QHS Eduard ClosArshad N Kakrakandy, MD   5 Units at 03/29/16 2212  . memantine (NAMENDA) tablet 5 mg  5 mg Oral BID Eduard ClosArshad N Kakrakandy, MD   5 mg at 03/30/16 0901  . ondansetron (ZOFRAN) tablet 4 mg  4 mg Oral Q6H PRN Eduard ClosArshad N Kakrakandy, MD       Or  . ondansetron [facility](ZOFRAN) injection 4 mg  4 mg Intravenous Q6H PRN Eduard ClosArshad N Kakrakandy, MD         Discharge Medications: Please see discharge summary for a list of discharge medications.  Relevant Imaging Results:  Relevant Lab Results:   Additional Information SSN [id number]  Arlyss RepressHarrison, Kymberley Raz F, LCSW

## 2016-03-30 NOTE — Progress Notes (Signed)
[facility]. All questions answered. Contact number provided.

## 2016-03-30 NOTE — Progress Notes (Signed)
Pt transferred to ALF via [facility] at this time. [facility] staff in possession of pt's d/c packet, transfer packet, and pt in stable condition.

## 2016-03-30 NOTE — Progress Notes (Signed)
Patient is set to discharge back to [facility] today. CSW confirmed with Mia at [facility] that they are able to take patient back. Patient's sister, Doristine CounterBertha made aware - CSW left voicemail for patient's other sister, Elease Hashimotoatricia. Discharge packet given to RN, Vinnie LangtonGretchen. [facility] called for transport.     Lincoln MaxinKelly Dominiq Fontaine, LCSW [facility] Clinical Social Worker cell #: [phone]

## 2016-03-30 NOTE — Discharge Summary (Signed)
Physician Discharge Summary  [name] RUE:[id number] DOB: [birth year] DOA: 03/27/2016  PCP: Mortimer Fries, PA  Admit date: 03/27/2016 Discharge date: 03/30/2016  Admitted From: ALF Disposition:  SNF  Recommendations for Outpatient Follow-up:  1. Follow up with PCP in 1-2 weeks 2. Please obtain BMP/CBC in one week 3. Consider reinitiating Lisinopril if Cr is stable given that patient is Diabetic  Home Health: No Equipment/Devices: None  Discharge Condition: Stable CODE STATUS: FULL CODE Diet recommendation: Heart Healthy / Carb Modified   Brief/Interim Summary: Manuel Diggsis a 66 y.o.male from ALF with history of CVA, dementia, diabetes mellitus, hypertension was brought to the ER after patient had an unwitnessed fall at his facility. Patient is a poor historian and is not able to give the exact details of his fall. Lab work revealed acute renal failure with mild hyperkalemia. Orthostatics positive and BP quite elevated.Patient was given 500 mL normal saline bolus and admitted for further hydration and observation. Patient's mentation improved and BUN/Cr after IVF Rehydration. Patient was slightly orthostatic this AM so was given a fluid bolus and improved. Deemed medically stable at this point to D/C to SNF where he will get therapy.  Discharge Diagnoses:  Principal Problem:   ARF (acute renal failure) (HCC) Active Problems:   Essential hypertension   Diabetes mellitus type 2 in nonobese [facility])   Fall  ARF (acute renal failure)/AKI- prerenal, improved - baseline Cr 1.0- 1.3;  - continued IVF at 75 mL/hr- holding Lisinopril and will defer to outpatient Physician to reinitiate given that patient is diabetic  - BUN/Cr improved to 18/1.07 - Restarted Aricept - follow oral fluid intake - Repeat CMP in at SNF  Fall likely 2/2 to Orthostatic Hypotension - Pelvic X-Ray Negative - likely due to orthostasis - Continued to Hydrate  - PT Recommneding SNF - Repeat Orthostatics  improved  Hyperkalemia - resolved with hydration  Essential Hypertension - C/w Amlodipine 10 mg and Hydralazine 25 mg q8h - Lisinopril on hold- will defer to PCP/Physician at SNF to restart if Cr remains stable in 1-2 weeks  Diabetes mellitus type 2 in nonobese  - last A1c 7.3 recently  -C/w Home Insulin Regimen and with Home Metformin  Dementia with Bipolar Disorder - Restarted Aricept - Cont Depakote 125 mg po BID and Memantine 5 mg po BID  Hx of CVA -C/w Aspirin 81 mg and Atorvastatin 80 mg po qHS -C/w Baclofen 10 mg po Daily at bedtime for Spasticity   Discharge Instructions  Discharge Instructions    Call MD for:  difficulty breathing, headache or visual disturbances    Complete by:  As directed    Call MD for:  persistant dizziness or light-headedness    Complete by:  As directed    Call MD for:  persistant nausea and vomiting    Complete by:  As directed    Call MD for:  severe uncontrolled pain    Complete by:  As directed    Call MD for:  temperature >100.4    Complete by:  As directed    Diet - low sodium heart healthy    Complete by:  As directed    Discharge instructions    Complete by:  As directed    Follow up Care at SNF. Take all medications as prescribed. If symptoms change or worsen please return to the ED for evaluation.   Increase activity slowly    Complete by:  As directed      Allergies as of 03/30/2016  Reactions   Risperdal [risperidone]    [facility]       Medication List    STOP taking these medications   lisinopril 10 MG tablet Commonly known as:  PRINIVIL,ZESTRIL     TAKE these medications   acetaminophen 500 MG tablet Commonly known as:  TYLENOL Take 500 mg by mouth every 4 (four) hours as needed for mild pain, moderate pain, fever or headache.   ALMACONE 200-200-20 MG/5ML suspension Generic drug:  alum & mag hydroxide-simeth Take 30 mLs by mouth as needed for indigestion or heartburn.   amLODipine 10  MG tablet Commonly known as:  NORVASC Take 1 tablet (10 mg total) by mouth daily. Start taking on:  03/31/2016   aspirin 81 MG chewable tablet Chew 1 tablet (81 mg total) by mouth daily. What changed:  when to take this   atorvastatin 80 MG tablet Commonly known as:  LIPITOR Take 1 tablet (80 mg total) by mouth daily at 6 PM.   baclofen 10 MG tablet Commonly known as:  LIORESAL Take 1 tablet (10 mg total) by mouth at bedtime.   divalproex 125 MG capsule Commonly known as:  DEPAKOTE SPRINKLE Take 125 mg by mouth 2 (two) times daily.   donepezil 5 MG tablet Commonly known as:  ARICEPT Take 5 mg by mouth at bedtime.   FLUoxetine 40 MG capsule Commonly known as:  PROZAC Take 40 mg by mouth daily with breakfast.   guaifenesin 100 MG/5ML syrup Commonly known as:  ROBITUSSIN Take 200 mg by mouth every 6 (six) hours as needed for cough.   hydrALAZINE 25 MG tablet Commonly known as:  APRESOLINE Take 1 tablet (25 mg total) by mouth every 8 (eight) hours.   LEVEMIR FLEXPEN 100 UNIT/ML Pen Generic drug:  Insulin Detemir Inject 5 Units into the skin at bedtime.   loperamide 2 MG tablet Commonly known as:  IMODIUM A-D Take 2 mg by mouth as needed for diarrhea or loose stools.   magnesium hydroxide 400 MG/5ML suspension Commonly known as:  MILK OF MAGNESIA Take 30 mLs by mouth at bedtime as needed for mild constipation.   memantine 5 MG tablet Commonly known as:  NAMENDA Take 5 mg by mouth 2 (two) times daily.   metFORMIN 500 MG tablet Commonly known as:  GLUCOPHAGE Take 500 mg by mouth 2 (two) times daily with a meal.   neomycin-bacitracin-polymyxin ointment Commonly known as:  NEOSPORIN Apply 1 application topically as needed for wound care.   NOVOLOG FLEXPEN 100 UNIT/ML FlexPen Generic drug:  insulin aspart Inject 2-12 Units into the skin 3 (three) times daily with meals. Per sliding scale  150 - 200 = 2 units 201 - 250 = 4 units  251 - 300 = 6 units  301 - 350 = 8  units  351 - 400 = 10 units > 401 = 12 units and Call MD   Vitamin D 2000 units Caps Take 2,000 Units by mouth daily with breakfast.       Allergies  Allergen Reactions  . Risperdal [Risperidone]     [facility] reaction per [facility]    Consultations:  None  Procedures/Studies: Dg Pelvis 1-2 Views  Result Date: 03/28/2016 CLINICAL DATA:  Patient fell tonight. Patient is noncommunicative and not complaining of pain. EXAM: PELVIS - 1-2 VIEW COMPARISON:  None. FINDINGS: There is no evidence of pelvic fracture or diastasis. No pelvic bone lesions are seen. Vascular calcifications. IMPRESSION: Negative. Electronically Signed   By: Chrissie NoaWilliam  Andria Meuse M.D.   On: 03/28/2016 01:06   Ct Head Wo Contrast  Result Date: 03/27/2016 CLINICAL DATA:  Unwitnessed fall. Patient does not recall fall. History of dementia, hypertension, diabetes, hyperlipidemia. EXAM: CT HEAD WITHOUT CONTRAST TECHNIQUE: Contiguous axial images were obtained from the base of the skull through the vertex without intravenous contrast. COMPARISON:  MRI of the brain May 07, 2015 FINDINGS: BRAIN: No intraparenchymal hemorrhage, mass effect nor midline shift. Moderate ventriculomegaly on the basis of global parenchymal brain volume loss. Multiple bilateral cerebellar, basal ganglia and thalamus lacunar infarcts. Advanced brainstem volume loss. Confluent supratentorial and pontine white matter hypodensities. No abnormal extra-axial fluid collections. Basal cisterns are patent. VASCULAR: Moderate calcific atherosclerosis of the carotid siphons. SKULL: No skull fracture. No significant scalp soft tissue swelling. SINUSES/ORBITS: Trace paranasal sinus mucosal thickening. Mastoid air cells are well aerated. The included ocular globes and orbital contents are non-suspicious. OTHER: None. IMPRESSION: 1. No acute intracranial process. 2. Stable chronic changes including multiple old small infarcts and moderate to severe chronic small vessel ischemic  disease with moderate brain atrophy. Electronically Signed   By: Awilda Metro M.D.   On: 03/27/2016 23:14     Subjective: Seen and examined at bedside and doing well. No complaints at this time and no nausea or vomiting. Wanting to get therapy.  Discharge Exam: Vitals:   03/29/16 2100 03/30/16 0544  BP: (!) 197/78 139/61  Pulse: 77 70  Resp: 18 18  Temp: 99.1 F (37.3 C) 98.5 F (36.9 C)   Vitals:   03/29/16 2100 03/30/16 0544 03/30/16 0556 03/30/16 0849  BP: (!) 197/78 139/61    Pulse: 77 70    Resp: 18 18    Temp: 99.1 F (37.3 C) 98.5 F (36.9 C)    TempSrc: Oral Oral    SpO2: 100% 98%  99%  Weight:   72.6 kg (160 lb 0.9 oz)   Height:       General: Pt is alert, awake, not in acute distress Cardiovascular: RRR, S1/S2 +, no rubs, no gallops Respiratory: CTA bilaterally, no wheezing, no rhonchi Abdominal: Soft, NT, ND, bowel sounds + Extremities: no edema, no cyanosis  The results of significant diagnostics from this hospitalization (including imaging, microbiology, ancillary and laboratory) are listed below for reference.    Microbiology: Recent Results (from the past 240 hour(s))  MRSA PCR Screening     Status: None   Collection Time: 03/28/16  3:36 AM  Result Value Ref Range Status   MRSA by PCR NEGATIVE NEGATIVE Final    Comment:        The GeneXpert MRSA Assay (FDA approved for NASAL specimens only), is one component of a comprehensive MRSA colonization surveillance program. It is not intended to diagnose MRSA infection nor to guide or monitor treatment for MRSA infections.     Labs: BNP (last 3 results) No results for input(s): BNP in the last 8760 hours. Basic Metabolic Panel:  Recent Labs Lab 03/27/16 2236 03/28/16 0146 03/29/16 1822 03/30/16 0610  NA 141 141 137 137  K 5.2* 4.7 4.6 4.2  CL 108 108 103 106  CO2 26 25 26 24   GLUCOSE 131* 118* 151* 139*  BUN 49* 48* 20 18  CREATININE 2.20* 1.83* 1.25* 1.07  CALCIUM 9.2 9.0 9.1 8.4*   MG  --   --  1.5* 1.5*  PHOS  --   --  3.6 3.2   Liver Function Tests:  Recent Labs Lab 03/29/16 1822 03/30/16 0610  AST 20  17  ALT 15* 13*  ALKPHOS 96 75  BILITOT 0.5 0.5  PROT 7.3 5.8*  ALBUMIN 3.9 3.3*   No results for input(s): LIPASE, AMYLASE in the last 168 hours. No results for input(s): AMMONIA in the last 168 hours. CBC:  Recent Labs Lab 03/27/16 2236 03/28/16 0146 03/29/16 1822 03/30/16 0610  WBC 9.0 8.8 7.1 6.0  NEUTROABS 7.3  --  4.3 3.3  HGB 9.8* 9.8* 11.4* 9.9*  HCT 29.1* 29.0* 34.0* 29.9*  MCV 93.9 93.5 93.9 92.3  PLT 168 164 167 151   Cardiac Enzymes:  Recent Labs Lab 03/28/16 0146  CKTOTAL 215  TROPONINI <0.03   BNP: Invalid input(s): POCBNP CBG:  Recent Labs Lab 03/29/16 0739 03/29/16 1121 03/29/16 1617 03/29/16 2057 03/30/16 0805  GLUCAP 78 181* 130* 182* 127*   D-Dimer No results for input(s): DDIMER in the last 72 hours. Hgb A1c No results for input(s): HGBA1C in the last 72 hours. Lipid Profile No results for input(s): CHOL, HDL, LDLCALC, TRIG, CHOLHDL, LDLDIRECT in the last 72 hours. Thyroid function studies No results for input(s): TSH, T4TOTAL, T3FREE, THYROIDAB in the last 72 hours.  Invalid input(s): FREET3 Anemia work up No results for input(s): VITAMINB12, FOLATE, FERRITIN, TIBC, IRON, RETICCTPCT in the last 72 hours. Urinalysis    Component Value Date/Time   COLORURINE YELLOW 03/27/2016 2236   APPEARANCEUR CLEAR 03/27/2016 2236   LABSPEC 1.018 03/27/2016 2236   PHURINE 5.0 03/27/2016 2236   GLUCOSEU NEGATIVE 03/27/2016 2236   HGBUR NEGATIVE 03/27/2016 2236   BILIRUBINUR NEGATIVE 03/27/2016 2236   KETONESUR NEGATIVE 03/27/2016 2236   PROTEINUR NEGATIVE 03/27/2016 2236   UROBILINOGEN 0.2 04/18/2013 2134   NITRITE NEGATIVE 03/27/2016 2236   LEUKOCYTESUR NEGATIVE 03/27/2016 2236   Sepsis Labs Invalid input(s): PROCALCITONIN,  WBC,  LACTICIDVEN Microbiology Recent Results (from the past 240 hour(s))  MRSA  PCR Screening     Status: None   Collection Time: 03/28/16  3:36 AM  Result Value Ref Range Status   MRSA by PCR NEGATIVE NEGATIVE Final    Comment:        The GeneXpert MRSA Assay (FDA approved for NASAL specimens only), is one component of a comprehensive MRSA colonization surveillance program. It is not intended to diagnose MRSA infection nor to guide or monitor treatment for MRSA infections.    Time coordinating discharge: Over 30 minutes  SIGNED:  Merlene Laughter, DO [facility] 03/30/2016, 12:16 PM Pager [phone]  If 7PM-7AM, please contact night-coverage [email or web address] Password [email or web address]

## 2016-03-31 MED ORDER — HYDRALAZINE HCL 25 MG PO TABS: 25.0000 mg | ORAL_TABLET | Freq: Three times a day (TID) | ORAL | 0 refills | Status: AC

## 2016-03-31 MED ORDER — AMLODIPINE BESYLATE 10 MG PO TABS: 10.0000 mg | ORAL_TABLET | Freq: Every day | ORAL | 0 refills | Status: AC

## 2016-06-07 ENCOUNTER — Encounter (HOSPITAL_COMMUNITY): Payer: Self-pay | Admitting: *Deleted

## 2016-06-07 ENCOUNTER — Emergency Department (HOSPITAL_COMMUNITY): Payer: Medicaid Other

## 2016-06-07 ENCOUNTER — Emergency Department (HOSPITAL_COMMUNITY)
Admission: EM | Admit: 2016-06-07 | Discharge: 2016-06-09 | Disposition: A | Discharge status: Skilled Nursing Facility | Payer: Medicaid Other | Attending: Emergency Medicine | Admitting: Emergency Medicine

## 2016-06-07 DIAGNOSIS — R4689 Other symptoms and signs involving appearance and behavior: Secondary | ICD-10-CM

## 2016-06-07 DIAGNOSIS — Z794 Long term (current) use of insulin: Secondary | ICD-10-CM | POA: Diagnosis not present

## 2016-06-07 DIAGNOSIS — Z79899 Other long term (current) drug therapy: Secondary | ICD-10-CM | POA: Insufficient documentation

## 2016-06-07 DIAGNOSIS — F01518 Vascular dementia, unspecified severity, with other behavioral disturbance: Secondary | ICD-10-CM

## 2016-06-07 DIAGNOSIS — E119 Type 2 diabetes mellitus without complications: Secondary | ICD-10-CM | POA: Diagnosis not present

## 2016-06-07 DIAGNOSIS — R4589 Other symptoms and signs involving emotional state: Secondary | ICD-10-CM | POA: Diagnosis present

## 2016-06-07 DIAGNOSIS — F0151 Vascular dementia with behavioral disturbance: Secondary | ICD-10-CM | POA: Insufficient documentation

## 2016-06-07 DIAGNOSIS — F03918 Unspecified dementia, unspecified severity, with other behavioral disturbance: Secondary | ICD-10-CM | POA: Diagnosis present

## 2016-06-07 DIAGNOSIS — F0391 Unspecified dementia with behavioral disturbance: Secondary | ICD-10-CM | POA: Diagnosis present

## 2016-06-07 DIAGNOSIS — Z7982 Long term (current) use of aspirin: Secondary | ICD-10-CM | POA: Insufficient documentation

## 2016-06-07 DIAGNOSIS — Z87891 Personal history of nicotine dependence: Secondary | ICD-10-CM | POA: Diagnosis not present

## 2016-06-07 DIAGNOSIS — I10 Essential (primary) hypertension: Secondary | ICD-10-CM | POA: Diagnosis not present

## 2016-06-07 LAB — COMPREHENSIVE METABOLIC PANEL
ALBUMIN: 4.3 g/dL (ref 3.5–5.0)
ALT: 14 U/L — AB (ref 17–63)
AST: 24 U/L (ref 15–41)
Alkaline Phosphatase: 115 U/L (ref 38–126)
Anion gap: 8 (ref 5–15)
BUN: 29 mg/dL — AB (ref 6–20)
CO2: 25 mmol/L (ref 22–32)
CREATININE: 1.34 mg/dL — AB (ref 0.61–1.24)
Calcium: 9.2 mg/dL (ref 8.9–10.3)
Chloride: 103 mmol/L (ref 101–111)
GFR calc Af Amer: >60 mL/min (ref >60)
GFR calc non Af Amer: 54 mL/min — ABNORMAL LOW (ref >60)
GLUCOSE: 124 mg/dL — AB (ref 65–99)
POTASSIUM: 5.6 mmol/L — AB (ref 3.5–5.1)
Sodium: 136 mmol/L (ref 135–145)
TOTAL PROTEIN: 7.8 g/dL (ref 6.5–8.1)
Total Bilirubin: 0.5 mg/dL (ref 0.3–1.2)

## 2016-06-07 LAB — CBC WITH DIFFERENTIAL/PLATELET
BASOS ABS: 0 10*3/uL (ref 0.0–0.1)
Basophils Relative: 0 %
EOS ABS: 0.2 10*3/uL (ref 0.0–0.7)
EOS PCT: 3 %
HCT: 32.4 % — ABNORMAL LOW (ref 39.0–52.0)
HEMOGLOBIN: 10.3 g/dL — AB (ref 13.0–17.0)
LYMPHS ABS: 1.9 10*3/uL (ref 0.7–4.0)
Lymphocytes Relative: 29 %
MCH: 30.7 pg (ref 26.0–34.0)
MCHC: 31.8 g/dL (ref 30.0–36.0)
MCV: 96.7 fL (ref 78.0–100.0)
Monocytes Absolute: 0.6 10*3/uL (ref 0.1–1.0)
Monocytes Relative: 9 %
NEUTROS PCT: 59 %
Neutro Abs: 3.8 10*3/uL (ref 1.7–7.7)
Platelets: 195 10*3/uL (ref 150–400)
RBC: 3.35 MIL/uL — AB (ref 4.22–5.81)
RDW: 13.4 % (ref 11.5–15.5)
WBC: 6.4 10*3/uL (ref 4.0–10.5)

## 2016-06-07 LAB — URINALYSIS, ROUTINE W REFLEX MICROSCOPIC
Bilirubin Urine: NEGATIVE
Glucose, UA: NEGATIVE mg/dL
Hgb urine dipstick: NEGATIVE
KETONES UR: NEGATIVE mg/dL
LEUKOCYTES UA: NEGATIVE
NITRITE: NEGATIVE
PROTEIN: NEGATIVE mg/dL
Specific Gravity, Urine: 1.011 (ref 1.005–1.030)
pH: 7 (ref 5.0–8.0)

## 2016-06-07 LAB — RAPID URINE DRUG SCREEN, HOSP PERFORMED
Amphetamines: NOT DETECTED
BARBITURATES: NOT DETECTED
BENZODIAZEPINES: NOT DETECTED
Cocaine: NOT DETECTED
Opiates: NOT DETECTED
Tetrahydrocannabinol: NOT DETECTED

## 2016-06-07 LAB — ETHANOL

## 2016-06-07 LAB — CBG MONITORING, ED
GLUCOSE-CAPILLARY: 167 mg/dL — AB (ref 65–99)
GLUCOSE-CAPILLARY: 185 mg/dL — AB (ref 65–99)

## 2016-06-07 MED ORDER — QUETIAPINE FUMARATE 25 MG PO TABS
25.0000 mg | ORAL_TABLET | Freq: Every day | ORAL | Status: DC
  Administered 2016-06-07 – 2016-06-08 (×2): 25 mg via ORAL
  Filled 2016-06-07 (×2): qty 1

## 2016-06-07 MED ORDER — IBUPROFEN 200 MG PO TABS: 600.0000 mg | ORAL_TABLET | Freq: Three times a day (TID) | ORAL | Status: DC | PRN

## 2016-06-07 MED ORDER — GUAIFENESIN 100 MG/5ML PO SOLN
200.0000 mg | Freq: Four times a day (QID) | ORAL | Status: DC | PRN
  Filled 2016-06-07: qty 10

## 2016-06-07 MED ORDER — INSULIN DETEMIR 100 UNIT/ML ~~LOC~~ SOLN
5.0000 [IU] | Freq: Every day | SUBCUTANEOUS | Status: DC
Start: 2016-06-07 — End: 2016-06-09
  Administered 2016-06-07 – 2016-06-08 (×2): 5 [IU] via SUBCUTANEOUS
  Filled 2016-06-07: qty 3
  Filled 2016-06-07 (×2): qty 0.05

## 2016-06-07 MED ORDER — LOPERAMIDE HCL 2 MG PO CAPS: 2.0000 mg | ORAL_CAPSULE | ORAL | Status: DC | PRN

## 2016-06-07 MED ORDER — ATORVASTATIN CALCIUM 80 MG PO TABS
80.0000 mg | ORAL_TABLET | Freq: Every day | ORAL | Status: DC
  Administered 2016-06-08: 80 mg via ORAL
  Filled 2016-06-07: qty 1

## 2016-06-07 MED ORDER — AMLODIPINE BESYLATE 5 MG PO TABS
10.0000 mg | ORAL_TABLET | Freq: Every day | ORAL | Status: DC
  Administered 2016-06-07 – 2016-06-09 (×3): 10 mg via ORAL
  Filled 2016-06-07 (×2): qty 2

## 2016-06-07 MED ORDER — METFORMIN HCL 500 MG PO TABS
500.0000 mg | ORAL_TABLET | Freq: Two times a day (BID) | ORAL | Status: DC
  Administered 2016-06-08 – 2016-06-09 (×3): 500 mg via ORAL
  Filled 2016-06-07 (×3): qty 1

## 2016-06-07 MED ORDER — HYDRALAZINE HCL 25 MG PO TABS
25.0000 mg | ORAL_TABLET | Freq: Three times a day (TID) | ORAL | Status: DC
  Administered 2016-06-07 – 2016-06-09 (×5): 25 mg via ORAL
  Filled 2016-06-07 (×7): qty 1

## 2016-06-07 MED ORDER — DIVALPROEX SODIUM 125 MG PO CSDR
125.0000 mg | DELAYED_RELEASE_CAPSULE | Freq: Two times a day (BID) | ORAL | Status: DC
  Administered 2016-06-07 – 2016-06-08 (×2): 125 mg via ORAL
  Filled 2016-06-07 (×2): qty 1

## 2016-06-07 MED ORDER — DONEPEZIL HCL 5 MG PO TABS
5.0000 mg | ORAL_TABLET | Freq: Every day | ORAL | Status: DC
  Administered 2016-06-07 – 2016-06-08 (×2): 5 mg via ORAL
  Filled 2016-06-07 (×2): qty 1

## 2016-06-07 MED ORDER — ALUM & MAG HYDROXIDE-SIMETH 200-200-20 MG/5ML PO SUSP: 30.0000 mL | Freq: Four times a day (QID) | ORAL | Status: DC | PRN

## 2016-06-07 MED ORDER — LORAZEPAM 0.5 MG PO TABS
0.2500 mg | ORAL_TABLET | Freq: Three times a day (TID) | ORAL | Status: DC
  Administered 2016-06-07 – 2016-06-08 (×2): 0.25 mg via ORAL
  Filled 2016-06-07 (×2): qty 1

## 2016-06-07 MED ORDER — GABAPENTIN 100 MG PO CAPS
200.0000 mg | ORAL_CAPSULE | Freq: Two times a day (BID) | ORAL | Status: DC
  Administered 2016-06-07 – 2016-06-09 (×4): 200 mg via ORAL
  Filled 2016-06-07 (×4): qty 2

## 2016-06-07 MED ORDER — FLUOXETINE HCL 20 MG PO CAPS
40.0000 mg | ORAL_CAPSULE | Freq: Every day | ORAL | Status: DC
  Administered 2016-06-08: 40 mg via ORAL
  Filled 2016-06-07: qty 2

## 2016-06-07 MED ORDER — INSULIN ASPART 100 UNIT/ML ~~LOC~~ SOLN
0.0000 [IU] | Freq: Three times a day (TID) | SUBCUTANEOUS | Status: DC
  Administered 2016-06-08: 2 [IU] via SUBCUTANEOUS
  Filled 2016-06-07: qty 1

## 2016-06-07 MED ORDER — MAGNESIUM HYDROXIDE 400 MG/5ML PO SUSP
30.0000 mL | Freq: Every evening | ORAL | Status: DC | PRN
  Filled 2016-06-07: qty 30

## 2016-06-07 NOTE — BH Assessment (Signed)
Tele Assessment Note   Manuel Higgins is an 66 y.o. male sts he came to the ED voluntarily. Pt sts he doe not know the reason he is here.  Pt sts he is not SI/HI and does not endorse AVH.  Pt sts he does not see a therapist or psychiatrist.  Pt could not express any stressful events in his life at the time  Pt sts he lives along. It is documented by the nurse the residents have been complaining about Manuel Higgins becoming more aggressive.  Manuel Higgins sts he can return to his dwelling and he does not report any aggressive behaviors.    Pt denies current and past abuse of alcohol and illicit drugs.  Pt denies having anger issues or a history of violent behaviors.  Pt denies having any upcoming charges or court dates.  Pt presented in hospital scrubs with an unremarkable appearance. Pt had good eye contact and coherent speech.  Pt was alert and cooperative.  His mood was pleasant. His affect was calm and pleasant.  His judgment was unimpaired an his insight fair.  Pt was 4x's oriented.  Pt is being recommended for an AM Psyhe Eval per Manuel Sievert, NP, to for further evaluation and determination of medication needs.  Diagnosis: Adjustment Disorder  Past Medical History:  Past Medical History:  Diagnosis Date  . Anemia   . Dementia    unspecified w/o behavioral disturbance  . Diabetes mellitus without complication (HCC)   . Dysphagia   . Hyperlipidemia   . Hypertension   . Hypertensive crisis 02/15/2013  . Stroke [facility]) 03/2013   Multiple infarcts  . Toe ulcer (HCC)     Past Surgical History:  Procedure Laterality Date  . BACK SURGERY    . LOOP RECORDER IMPLANT  04/08/2013   MDT LinQ implanted by Dr Ladona Ridgel for cryptogenic stroke  . LOOP RECORDER IMPLANT N/A 04/08/2013   Procedure: LOOP RECORDER IMPLANT;  Surgeon: Marinus Maw, MD;  Location: [facility] CATH LAB;  Service: Cardiovascular;  Laterality: N/A;  . TEE WITHOUT CARDIOVERSION N/A 04/07/2013   Procedure: TRANSESOPHAGEAL ECHOCARDIOGRAM (TEE);   Surgeon: Lars Masson, MD;  Location: [facility] ENDOSCOPY;  Service: Cardiovascular;  Laterality: N/A;  . TONSILLECTOMY      Family History:  Family History  Problem Relation Age of Onset  . Family history unknown: Yes    Social History:  reports that he quit smoking about 12 years ago. His smoking use included Cigarettes. He has a 3.30 pack-year smoking history. He has never used smokeless tobacco. He reports that he does not drink alcohol or use drugs.  Additional Social History:  Alcohol / Drug Use Pain Medications: See MAR Prescriptions: See MAR Over the Counter: See MAR History of alcohol / drug use?: No history of alcohol / drug abuse  CIWA: CIWA-Ar BP: (!) 184/76 Pulse Rate: (!) 58 COWS:    PATIENT STRENGTHS: (choose at least two) Ability for insight Average or above average intelligence Communication skills  Allergies:  Allergies  Allergen Reactions  . Risperdal [Risperidone]     Unknown reaction per [name]     Home Medications:  (Not in a hospital admission)  OB/GYN Status:  No LMP for male patient.  General Assessment Data Location of Assessment: [facility] ED TTS Assessment: In system Is this a Tele or Face-to-Face Assessment?: Face-to-Face Is this an Initial Assessment or a Re-assessment for this encounter?: Initial Assessment Marital status: Divorced Living Arrangements: Alone Admission Status: Voluntary Is patient capable of signing voluntary admission?:  Yes Referral Source: Self/Family/Friend Insurance type: Medicaid     Crisis Care Plan Living Arrangements: Alone Legal Guardian: Other: (Self) Name of Psychiatrist: None reported Name of Therapist: [facility]  Education Status Is patient currently in school?: No Current Grade: n Highest grade 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 : No Suicidal Intent: No Has  patient had any suicidal intent within the past 6 months prior to admission? : No Is patient at risk for suicide?: No Suicidal Plan?: No Has patient had any suicidal plan within the past 6 months prior to admission? : No Access to Means: No What has been your use of drugs/alcohol within the last 12 months?: None reported Previous Attempts/Gestures: No How many times?: 0 Other Self Harm Risks: no Triggers for Past Attempts: None known Intentional Self Injurious Behavior: None Family Suicide History: No Recent stressful life event(s): Other (Comment) (None) Persecutory voices/beliefs?: No Depression: No Depression Symptoms:  (None reported) Substance abuse history and/or treatment for substance abuse?: No Suicide prevention information given to non-admitted patients: Not applicable  Risk to Others within the past 6 months Homicidal Ideation: No Does patient have any lifetime risk of violence toward others beyond the six months prior to admission? : No Thoughts of Harm to Others: No Current Homicidal Intent: No Current Homicidal Plan: No Access to Homicidal Means: No Identified Victim: N/A History of harm to others?: No Assessment of Violence: None Noted Violent Behavior Description: None reported Does patient have access to weapons?: No Criminal Charges Pending?: No Does patient have a court date: No Is patient on probation?: No  Psychosis Hallucinations: None noted Delusions: None noted  Mental Status Report Appearance/Hygiene: Unremarkable, In scrubs Eye Contact: Good Motor Activity: Unremarkable Speech: Logical/coherent, Unremarkable Level of Consciousness: Alert Mood: Pleasant Affect: Other (Comment) (Pleasant and calm) Anxiety Level: None Thought Processes: Coherent Judgement: Unimpaired Orientation: Person, Place, Time, Situation Obsessive Compulsive Thoughts/Behaviors: None  Cognitive Functioning Concentration: Normal Memory: Recent Intact, Remote  Intact IQ: Average Insight: Fair Impulse Control: Good Appetite: Good Weight Loss: 0 Weight Gain: 0 Sleep: No Change Total Hours of Sleep: 8 Vegetative Symptoms: None  ADLScreening [facility] Assessment Services) Patient's cognitive ability adequate to safely complete daily activities?: Yes Patient able to express need for assistance with ADLs?: Yes Independently performs ADLs?: Yes (appropriate for developmental age)  Prior Inpatient Therapy Prior Inpatient Therapy: Yes Prior Therapy Dates: 3 years  ago Prior Therapy Facilty/Provider(s): [facility] Reason for Treatment: pt said he had a cold  Prior Outpatient Therapy Prior Outpatient Therapy: Yes Prior Therapy Dates: 3 years ago Prior Therapy Facilty/Provider(s): [facility] Reason for Treatment: [facility] Does patient have an ACCT team?: No Does patient have Intensive In-House Services?  : No Does patient have [facility] services? : No Does patient have P4CC services?: No  ADL Screening (condition at time of admission) Patient's cognitive ability adequate to safely complete daily activities?: Yes Patient able to express need for assistance with ADLs?: Yes Independently performs ADLs?: Yes (appropriate for developmental age)       Abuse/Neglect Assessment (Assessment to be complete while patient is alone) Physical Abuse: Denies Verbal Abuse: Denies Sexual Abuse: Denies Exploitation of patient/patient's resources: Denies Self-Neglect: Denies Values / Beliefs Cultural Requests During Hospitalization: None Spiritual Requests During Hospitalization: None Consults Spiritual Care Consult Needed: No Social Work Consult Needed: No Merchant navy officerAdvance Directives (For Healthcare) Does  Patient Have a Medical Advance Directive?: No Type of Advance Directive: Healthcare Power of Attorney Copy of Healthcare Power of Attorney in Chart?: No - copy requested Would patient like information on creating a medical advance directive?: No - Patient  declined    Additional Information 1:1 In Past 12 Months?: No CIRT Risk: Yes (Based on nurses report of kicking and fighting) Elopement Risk: No Does patient have medical clearance?: Yes     Disposition:  Disposition Initial Assessment Completed for this Encounter: Yes Disposition of Patient: Other dispositions (AM Pysche Eval per Manuel Higgins)  Manuel Higgins [facility] 06/07/2016 8:07 PM

## 2016-06-07 NOTE — ED Notes (Signed)
Bed: WA29 Expected date:  Expected time:  Means of arrival:  Comments: 66 yo m agressive

## 2016-06-07 NOTE — ED Triage Notes (Signed)
Patient states he does not know why he is here. Per EMS [facility] and per their staff  stated, "over the past two weeks patient has become aggressive and combative toward staff. Patient  resting in bed cooperative and following directions without difficulty.

## 2016-06-07 NOTE — ED Provider Notes (Signed)
[facility]-EMERGENCY DEPT Provider Note   CSN: [id number] Arrival date & time: 06/07/16  1427     History   Chief Complaint Chief Complaint  Patient presents with  . Aggressive Behavior   Level 5 caveat  HPI Manuel Higgins is a 66 y.o. male who  has a past medical history of Anemia; Dementia; Diabetes mellitus without complication (HCC); Dysphagia; Hyperlipidemia; Hypertension; Hypertensive crisis (02/15/2013); Stroke [facility](HCC) (03/2013); and Toe ulcer (HCC). The patient was sent over from [facility] for aggressive behvior. I spoke with his Nurse Estanislado EmmsShaketa at [facility]. She states that his behavior changed and has been worsening over the past 2 weeks. 2 weeks ago they added locks to the other patient's rooms. She states that they did this because Manuel Higgins is obsessed with entering others rooms and taking their property and hiding it. She states that he is very angry about this and cant figure out how to undo the locks. He becomes agitated and has been hitting and kicking the staff. Their psychiatrist reccommended that he come here for evaluation and med management or perhaps geri-psych placement.  His nurse has not noticed any new medical complaints.   HPI  Past Medical History:  Diagnosis Date  . Anemia   . Dementia    unspecified w/o behavioral disturbance  . Diabetes mellitus without complication (HCC)   . Dysphagia   . Hyperlipidemia   . Hypertension   . Hypertensive crisis 02/15/2013  . Stroke [facility](HCC) 03/2013   Multiple infarcts  . Toe ulcer [facility](HCC)     Patient Active Problem List   Diagnosis Date Noted  . Diabetes mellitus type 2 in nonobese (HCC) 03/28/2016  . Fall 03/28/2016  . ARF (acute renal failure) (HCC) 03/27/2016  . 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 Hypokalemia 02/14/2016  . Leukocytosis 02/14/2016  . Altered mental status 05/07/2015  . History of embolic stroke 05/07/2015  . History of CVA with residual deficit  05/07/2015  . Acute encephalopathy 05/07/2015  . Essential hypertension 04/24/2014  . HLD (hyperlipidemia) 04/24/2014  . Dysphagia 04/04/2013  . Callus of foot 02/16/2013    Past Surgical History:  Procedure Laterality Date  . BACK SURGERY    . LOOP RECORDER IMPLANT  04/08/2013   MDT LinQ implanted by Dr Ladona Ridgelaylor for cryptogenic stroke  . LOOP RECORDER IMPLANT N/A 04/08/2013   Procedure: LOOP RECORDER IMPLANT;  Surgeon: Marinus MawGregg W Taylor, MD;  [facility];  Service: Cardiovascular;  Laterality: N/A;  . TEE WITHOUT CARDIOVERSION N/A 04/07/2013   Procedure: TRANSESOPHAGEAL ECHOCARDIOGRAM (TEE);  Surgeon: Lars MassonKatarina H Nelson, MD;  [facility];  Service: Cardiovascular;  Laterality: N/A;  . TONSILLECTOMY         Home Medications    Prior to Admission medications   Medication Sig Start Date End Date Taking? Authorizing Provider  acetaminophen (TYLENOL) 500 MG tablet Take 500 mg by mouth every 4 (four) hours as needed for mild pain, moderate pain, fever or headache.    [provider]  alum & mag hydroxide-simeth (ALMACONE) 200-200-20 MG/5ML suspension Take 30 mLs by mouth as needed for indigestion or heartburn.    [provider]  amLODipine (NORVASC) 10 MG tablet Take 1 tablet (10 mg total) by mouth daily. 03/31/16   Marguerita MerlesSheikh, Omair Latif, DO  aspirin 81 MG chewable tablet Chew 1 tablet (81 mg total) by mouth daily. Patient taking differently: Chew 81 mg by mouth daily with breakfast.  05/10/15   Earnest ConroyFlores,  Ronaldo Miyamoto, MD  atorvastatin (LIPITOR) 80 MG tablet Take 1 tablet (80 mg total) by mouth daily at 6 PM. 05/10/15   Selina Cooley, MD  baclofen (LIORESAL) 10 MG tablet Take 1 tablet (10 mg total) by mouth at bedtime. 05/10/15   Selina Cooley, MD  Cholecalciferol (VITAMIN D) 2000 units CAPS Take 2,000 Units by mouth daily with breakfast.     [provider]  divalproex (DEPAKOTE SPRINKLE) 125 MG capsule Take 125 mg by mouth 2 (two) times daily.    [provider]  donepezil (ARICEPT) 5 MG tablet Take 5 mg by mouth at bedtime.    [provider]  FLUoxetine (PROZAC) 40 MG capsule Take 40 mg by mouth daily with breakfast.    [provider]  guaifenesin (ROBITUSSIN) 100 MG/5ML syrup Take 200 mg by mouth every 6 (six) hours as needed for cough.    [provider]  hydrALAZINE (APRESOLINE) 25 MG tablet Take 1 tablet (25 mg total) by mouth every 8 (eight) hours. 03/31/16   Sheikh, Omair Latif, DO  insulin aspart (NOVOLOG FLEXPEN) 100 UNIT/ML FlexPen Inject 2-12 Units into the skin 3 (three) times daily with meals. Per sliding scale  150 - 200 = 2 units 201 - 250 = 4 units  251 - 300 = 6 units  301 - 350 = 8 units  351 - 400 = 10 units > 401 = 12 units and Call MD    [provider]  Insulin Detemir (LEVEMIR FLEXPEN) 100 UNIT/ML Pen Inject 5 Units into the skin at bedtime.    [provider]  loperamide (IMODIUM A-D) 2 MG tablet Take 2 mg by mouth as needed for diarrhea or loose stools.    [provider]  magnesium hydroxide (MILK OF MAGNESIA) 400 MG/5ML suspension Take 30 mLs by mouth at bedtime as needed for mild constipation.    [provider]  memantine (NAMENDA) 5 MG tablet Take 5 mg by mouth 2 (two) times daily.    [provider]  metFORMIN (GLUCOPHAGE) 500 MG tablet Take 500 mg by mouth 2 (two) times daily with a meal.    [provider]  neomycin-bacitracin-polymyxin (NEOSPORIN) ointment Apply 1 application topically as needed for wound care.    [provider]    Family History Family History  Problem Relation Age of Onset  . Family history unknown: Yes    Social History Social History  Substance Use Topics  . Smoking status: Former Smoker    Packs/day: 0.33    Years: 10.00    Types: Cigarettes    Quit date: 01/17/2004  . Smokeless tobacco: Never Used  . Alcohol use No     Comment: Denies Currently but previously marked as yes      Allergies   Risperdal [risperidone]   Review of Systems Review of Systems  Unable to perform ROS: Dementia     Physical Exam Updated Vital Signs BP 135/70 (BP Location: Right Arm)   Pulse (!) 59   Temp 97.8 F (36.6 C) (Oral)   Resp 16   Ht 5\' 9"  (1.753 m)   Wt 74.8 kg (165 lb)   SpO2 98%   BMI 24.37 kg/m   Physical Exam  Constitutional: He appears well-developed and well-nourished. No distress.  HENT:  Head: Normocephalic and atraumatic.  Erythematous circular facial rash near the nasal ala and in the beard  Eyes: Conjunctivae and EOM are normal. Pupils are equal, round, and reactive to light. No scleral  icterus.  Neck: Normal range of motion. Neck supple.  Cardiovascular: Normal rate, regular rhythm and normal heart sounds.   Pulmonary/Chest: Effort normal. No respiratory distress.  Thick ronchi in BL lung fields  Abdominal: Soft. There is no tenderness.  Musculoskeletal: He exhibits no edema.  Neurological: He is alert.  Skin: Skin is warm and dry. Capillary refill takes less than 2 seconds. He is not diaphoretic.  Psychiatric: His behavior is normal.  Nursing note and vitals reviewed.    ED Treatments / Results  Labs (all labs ordered are listed, but only abnormal results are displayed) Labs Reviewed  COMPREHENSIVE METABOLIC PANEL  ETHANOL  RAPID URINE DRUG SCREEN, HOSP PERFORMED  CBC WITH DIFFERENTIAL/PLATELET  URINALYSIS, ROUTINE W REFLEX MICROSCOPIC  CBG MONITORING, ED    EKG  EKG Interpretation None       Radiology No results found.  Procedures Procedures (including critical care time)  Medications Ordered in ED Medications - No data to display   Initial Impression / Assessment and Plan / ED Course  I have reviewed the triage vital signs and the nursing notes.  Pertinent labs & imaging results that were available during my care of the patient were reviewed by me and considered in my medical decision making (see chart for  details).     Patient with aggressive behavior. Appears medically stable for psych evaluation  Final Clinical Impressions(s) / ED Diagnoses   Final diagnoses:  None    New Prescriptions New Prescriptions   No medications on file     Arthor Captain, PA-C 06/09/16 Bernadene Person, MD 06/10/16 0700

## 2016-06-08 DIAGNOSIS — F0151 Vascular dementia with behavioral disturbance: Secondary | ICD-10-CM | POA: Diagnosis not present

## 2016-06-08 DIAGNOSIS — Z87891 Personal history of nicotine dependence: Secondary | ICD-10-CM | POA: Diagnosis not present

## 2016-06-08 DIAGNOSIS — F03918 Unspecified dementia, unspecified severity, with other behavioral disturbance: Secondary | ICD-10-CM | POA: Diagnosis present

## 2016-06-08 DIAGNOSIS — F0391 Unspecified dementia with behavioral disturbance: Secondary | ICD-10-CM | POA: Diagnosis present

## 2016-06-08 LAB — CBG MONITORING, ED
GLUCOSE-CAPILLARY: 161 mg/dL — AB (ref 65–99)
Glucose-Capillary: 106 mg/dL — ABNORMAL HIGH (ref 65–99)
Glucose-Capillary: 92 mg/dL (ref 65–99)
Glucose-Capillary: 136 mg/dL — ABNORMAL HIGH (ref 65–99)

## 2016-06-08 MED ORDER — LORAZEPAM 0.5 MG PO TABS
0.5000 mg | ORAL_TABLET | Freq: Four times a day (QID) | ORAL | Status: DC | PRN
Start: 2016-06-08 — End: 2016-06-09
  Administered 2016-06-09: 0.5 mg via ORAL
  Filled 2016-06-08: qty 1

## 2016-06-08 MED ORDER — DIVALPROEX SODIUM 125 MG PO CSDR
250.0000 mg | DELAYED_RELEASE_CAPSULE | Freq: Two times a day (BID) | ORAL | Status: DC
  Administered 2016-06-08 – 2016-06-09 (×2): 250 mg via ORAL
  Filled 2016-06-08 (×3): qty 2

## 2016-06-08 MED ORDER — FLUOXETINE HCL 20 MG PO CAPS
20.0000 mg | ORAL_CAPSULE | Freq: Every day | ORAL | Status: DC
  Administered 2016-06-09: 20 mg via ORAL
  Filled 2016-06-08: qty 1

## 2016-06-08 NOTE — Progress Notes (Signed)
Prior to leaving unit, I had a second brief visit w/pt and sisters. He said he did not have any questions. When asked about his family, he said he has two daughters, one in [place] and the other in [place] and he said he has two brothers. I said to pt perhaps he can talk with his daughters when they visit about his healthcare and that we would let him rest tonight.  Please page if additional assistance is needed. Chaplain Marjory LiesPamela Carrington Holder, M.Div.   06/08/16 2000  Clinical Encounter Type  Visited With Patient and family together

## 2016-06-08 NOTE — ED Notes (Signed)
Family remains @ [facility].

## 2016-06-08 NOTE — Consult Note (Signed)
[facility] Psychiatry Consult   Reason for Consult:  Psychiatric evaluation Referring Physician:  EDP Patient Identification: Amaris Garrette MRN:  [id number] Principal Diagnosis: Dementia with behavioral disturbance Diagnosis:   Patient Active Problem List   Diagnosis Date Noted  . Dementia with behavioral disturbance [F03.91] 06/08/2016    Priority: High  . Diabetes mellitus type 2 in nonobese ([place]) [E11.9] 03/28/2016  . Fall [W19.XXXA] 03/28/2016  . ARF (acute renal failure) ([place]) [N17.9] 03/27/2016  . Type 2 diabetes mellitus with hypoglycemic insulin reaction ([place]) [E11.649] 02/15/2016  . Hypokalemia [E87.6] 02/14/2016  . Leukocytosis [D72.829] 02/14/2016  . Altered mental status [R41.82] 05/07/2015  . History of embolic stroke [B71.69] 67/89/3810  . History of CVA with residual deficit [I69.30] 05/07/2015  . Acute encephalopathy [G93.40] 05/07/2015  . Essential hypertension [I10] 04/24/2014  . HLD (hyperlipidemia) [E78.5] 04/24/2014  . Dysphagia [R13.10] 04/04/2013  . Callus of foot [L84] 02/16/2013    Total Time spent with patient: 45 minutes  Subjective:   Luke Falero is a 66 y.o. male patient admitted with aggressive behavior.  HPI:  Patient is a poor historian who was brought  from [facility] due to recent change in behavior. Caregiver at his facility reports that he has been getting increasingly more aggressive and acting bizarre. They reports that patient gets combative, kick and hit staff when he is upset. He is also reported to be obsessed with entering others rooms and taking their property and hiding it. Patient is unable to confirm or deny all the allegations against him.   Past Psychiatric History: as above  Risk to Self: Suicidal Ideation: No Suicidal Intent: No Is patient at risk for suicide?: No Suicidal Plan?: No Access to Means: No What has been your use of drugs/alcohol within the last 12 months?: None reported How many times?: 0 Other  Self Harm Risks: no Triggers for Past Attempts: None known Intentional Self Injurious Behavior: None Risk to Others: Homicidal Ideation: No Thoughts of Harm to Others: No Current Homicidal Intent: No Current Homicidal Plan: No Access to Homicidal Means: No Identified Victim: N/A History of harm to others?: No Assessment of Violence: None Noted Violent Behavior Description: None reported Does patient have access to weapons?: No Criminal Charges Pending?: No Does patient have a court date: No Prior Inpatient Therapy: Prior Inpatient Therapy: Yes Prior Therapy Dates: 3 years  ago Prior Therapy Facilty/Provider(s): [facility] Reason for Treatment: pt said he had a cold Prior Outpatient Therapy: Prior Outpatient Therapy: Yes Prior Therapy Dates: 3 years ago Prior [facility]ilty[facility]Provider([facility] for Treatment: [facility] Does patient have an ACCT team?: No Does patient have Intensive In-House Services?  : No Does patient have [facility] services? : No Does patient have P4CC services?: No  Past Medical History:  Past Medical History:  Diagnosis Date  . Anemia   . Dementia    unspecified w/o behavioral disturbance  . Diabetes mellitus without complication ([place])   . Dysphagia   . Hyperlipidemia   . Hypertension   . Hypertensive crisis 02/15/2013  . Stroke [facility]) 03/2013   Multiple infarcts  . Toe ulcer ([place])     Past Surgical History:  Procedure Laterality Date  . BACK SURGERY    . LOOP RECORDER IMPLANT  04/08/2013   MDT LinQ implanted by Dr Lovena Le for cryptogenic stroke  . LOOP RECORDER IMPLANT N/A 04/08/2013   Procedure: LOOP RECORDER IMPLANT;  Surgeon: Evans Lance, MD;  Location: [facility] CATH LAB;  Service: Cardiovascular;  Laterality:  N/A;  . TEE WITHOUT CARDIOVERSION N/A 04/07/2013   Procedure: TRANSESOPHAGEAL ECHOCARDIOGRAM (TEE);  Surgeon: Dorothy Spark, MD;  Location: [facility] ENDOSCOPY;  Service: Cardiovascular;  Laterality: N/A;  . TONSILLECTOMY     Family History:   Family History  Problem Relation Age of Onset  . Family history unknown: Yes   Family Psychiatric  History:  Social History:  History  Alcohol Use No    Comment: Denies Currently but previously marked as yes     History  Drug Use No    Comment: denies hx of IVDrug use    Social History   Social History  . Marital status: Single    Spouse name: N/A  . Number of children: 2  . Years of education: BS   Occupational History  .      disabled   Social History Main Topics  . Smoking status: Former Smoker    Packs/day: 0.33    Years: 10.00    Types: Cigarettes    Quit date: 01/17/2004  . Smokeless tobacco: Never Used  . Alcohol use No     Comment: Denies Currently but previously marked as yes  . Drug use: No     Comment: denies hx of IVDrug use  . Sexual activity: Not Asked   Other Topics Concern  . None   Social History Narrative   Lives in [place] in [place] for past 6 weeks.  Previously homeless.   Unemployed.  Caffeine 1 cup coffee, 2110m sweet tea daily.   Additional Social History:    Allergies:   Allergies  Allergen Reactions  . Risperdal [Risperidone]     [facility]    Labs:  Results for orders placed or performed during the hospital encounter of 06/07/16 (from the past 48 hour(s))  Comprehensive metabolic panel     Status: Abnormal   Collection Time: 06/07/16  3:09 PM  Result Value Ref Range   Sodium 136 135 - 145 mmol/L   Potassium 5.6 (H) 3.5 - 5.1 mmol/L   Chloride 103 101 - 111 mmol/L   CO2 25 22 - 32 mmol/L   Glucose, Bld 124 (H) 65 - 99 mg/dL   BUN 29 (H) 6 - 20 mg/dL   Creatinine, Ser 1.34 (H) 0.61 - 1.24 mg/dL   Calcium 9.2 8.9 - 10.3 mg/dL   Total Protein 7.8 6.5 - 8.1 g/dL   Albumin 4.3 3.5 - 5.0 g/dL   AST 24 15 - 41 U/L   ALT 14 (L) 17 - 63 U/L   Alkaline Phosphatase 115 38 - 126 U/L   Total Bilirubin 0.5 0.3 - 1.2 mg/dL   GFR calc non Af Amer 54 (L) >60 mL/min   GFR calc Af Amer >60 >60 mL/min     Comment: (NOTE) The eGFR has been calculated using the CKD EPI equation. This calculation has not been validated in all clinical situations. eGFR's persistently <60 mL/min signify possible Chronic Kidney Disease.    Anion gap 8 5 - 15  Ethanol     Status: None   Collection Time: 06/07/16  3:09 PM  Result Value Ref Range   Alcohol, Ethyl (B) <5 <5 mg/dL    Comment:        LOWEST DETECTABLE LIMIT FOR SERUM ALCOHOL IS 5 mg/dL FOR MEDICAL PURPOSES ONLY   CBC with Diff     Status: Abnormal   Collection Time: 06/07/16  3:09 PM  Result Value Ref Range   WBC  6.4 4.0 - 10.5 K/uL   RBC 3.35 (L) 4.22 - 5.81 MIL/uL   Hemoglobin 10.3 (L) 13.0 - 17.0 g/dL   HCT 32.4 (L) 39.0 - 52.0 %   MCV 96.7 78.0 - 100.0 fL   MCH 30.7 26.0 - 34.0 pg   MCHC 31.8 30.0 - 36.0 g/dL   RDW 13.4 11.5 - 15.5 %   Platelets 195 150 - 400 K/uL   Neutrophils Relative % 59 %   Neutro Abs 3.8 1.7 - 7.7 K/uL   Lymphocytes Relative 29 %   Lymphs Abs 1.9 0.7 - 4.0 K/uL   Monocytes Relative 9 %   Monocytes Absolute 0.6 0.1 - 1.0 K/uL   Eosinophils Relative 3 %   Eosinophils Absolute 0.2 0.0 - 0.7 K/uL   Basophils Relative 0 %   Basophils Absolute 0.0 0.0 - 0.1 K/uL  Urine rapid drug screen (hosp performed)     Status: None   Collection Time: 06/07/16  5:49 PM  Result Value Ref Range   Opiates NONE DETECTED NONE DETECTED   Cocaine NONE DETECTED NONE DETECTED   Benzodiazepines NONE DETECTED NONE DETECTED   Amphetamines NONE DETECTED NONE DETECTED   Tetrahydrocannabinol NONE DETECTED NONE DETECTED   Barbiturates NONE DETECTED NONE DETECTED    Comment:        DRUG SCREEN FOR MEDICAL PURPOSES ONLY.  IF CONFIRMATION IS NEEDED FOR ANY PURPOSE, NOTIFY LAB WITHIN 5 DAYS.        LOWEST DETECTABLE LIMITS FOR URINE DRUG SCREEN Drug Class       Cutoff (ng/mL) Amphetamine      1000 Barbiturate      200 Benzodiazepine   527 Tricyclics       782 Opiates          300 Cocaine          300 THC              50    Urinalysis, Routine w reflex microscopic     Status: Abnormal   Collection Time: 06/07/16  5:49 PM  Result Value Ref Range   Color, Urine STRAW (A) YELLOW   APPearance CLEAR CLEAR   Specific Gravity, Urine 1.011 1.005 - 1.030   pH 7.0 5.0 - 8.0   Glucose, UA NEGATIVE NEGATIVE mg/dL   Hgb urine dipstick NEGATIVE NEGATIVE   Bilirubin Urine NEGATIVE NEGATIVE   Ketones, ur NEGATIVE NEGATIVE mg/dL   Protein, ur NEGATIVE NEGATIVE mg/dL   Nitrite NEGATIVE NEGATIVE   Leukocytes, UA NEGATIVE NEGATIVE  CBG monitoring, ED     Status: Abnormal   Collection Time: 06/07/16  6:16 PM  Result Value Ref Range   Glucose-Capillary 167 (H) 65 - 99 mg/dL  CBG monitoring, ED     Status: Abnormal   Collection Time: 06/07/16 10:31 PM  Result Value Ref Range   Glucose-Capillary 185 (H) 65 - 99 mg/dL  CBG monitoring, ED     Status: Abnormal   Collection Time: 06/08/16  8:24 AM  Result Value Ref Range   Glucose-Capillary 106 (H) 65 - 99 mg/dL    Current Facility-Administered Medications  Medication Dose Route Frequency Provider Last Rate Last Dose  . alum & mag hydroxide-simeth (MAALOX/MYLANTA) 200-200-20 MG/5ML suspension 30 mL  30 mL Oral Q6H PRN Harris, Abigail, PA-C      . amLODipine (NORVASC) tablet 10 mg  10 mg Oral Daily Harris, Abigail, PA-C   10 mg at 06/08/16 0944  . atorvastatin (LIPITOR) tablet 80 mg  80  mg Oral q1800 Harris, Abigail, PA-C      . divalproex (DEPAKOTE SPRINKLE) capsule 250 mg  250 mg Oral BID Cory Rama, MD      . donepezil (ARICEPT) tablet 5 mg  5 mg Oral QHS Harris, Abigail, PA-C   5 mg at 06/07/16 2222  . [START ON 06/09/2016] FLUoxetine (PROZAC) capsule 20 mg  20 mg Oral Q breakfast Abeer Deskins, MD      . gabapentin (NEURONTIN) capsule 200 mg  200 mg Oral BID Harris, Abigail, PA-C   200 mg at 06/08/16 4174  . guaiFENesin (ROBITUSSIN) 100 MG/5ML solution 200 mg  200 mg Oral Q6H PRN Harris, Abigail, PA-C      . hydrALAZINE (APRESOLINE) tablet 25 mg  25 mg Oral Q8H  Harris, Abigail, PA-C   25 mg at 06/08/16 0814  . ibuprofen (ADVIL,MOTRIN) tablet 600 mg  600 mg Oral Q8H PRN Harris, Abigail, PA-C      . insulin aspart (novoLOG) injection 0-12 Units  0-12 Units Subcutaneous TID WC Harris, Abigail, PA-C      . insulin detemir (LEVEMIR) injection 5 Units  5 Units Subcutaneous QHS Harris, Vernie Shanks, PA-C   5 Units at 06/07/16 2228  . loperamide (IMODIUM) capsule 2 mg  2 mg Oral PRN Margarita Mail, PA-C      . LORazepam (ATIVAN) tablet 0.25 mg  0.25 mg Oral Q8H Harris, Abigail, PA-C   0.25 mg at 06/08/16 4818  . magnesium hydroxide (MILK OF MAGNESIA) suspension 30 mL  30 mL Oral QHS PRN Harris, Abigail, PA-C      . metFORMIN (GLUCOPHAGE) tablet 500 mg  500 mg Oral BID WC Harris, Abigail, PA-C   500 mg at 06/08/16 0829  . QUEtiapine (SEROQUEL) tablet 25 mg  25 mg Oral QHS Harris, Vernie Shanks, PA-C   25 mg at 06/07/16 2223   Current Outpatient Prescriptions  Medication Sig Dispense Refill  . acetaminophen (TYLENOL) 500 MG tablet Take 500 mg by mouth every 4 (four) hours as needed for mild pain, moderate pain, fever or headache.    Marland Kitchen alum & mag hydroxide-simeth (ALMACONE) 200-200-20 MG/5ML suspension Take 30 mLs by mouth as needed for indigestion or heartburn.    Marland Kitchen amLODipine (NORVASC) 10 MG tablet Take 1 tablet (10 mg total) by mouth daily. 30 tablet 0  . aspirin 81 MG chewable tablet Chew 1 tablet (81 mg total) by mouth daily. (Patient taking differently: Chew 81 mg by mouth daily with breakfast. ) 90 tablet 3  . atorvastatin (LIPITOR) 80 MG tablet Take 1 tablet (80 mg total) by mouth daily at 6 PM. 90 tablet 3  . baclofen (LIORESAL) 10 MG tablet Take 1 tablet (10 mg total) by mouth at bedtime. 30 each 0  . Cholecalciferol (VITAMIN D) 2000 units CAPS Take 2,000 Units by mouth daily with breakfast.     . divalproex (DEPAKOTE SPRINKLE) 125 MG capsule Take 125 mg by mouth 2 (two) times daily.    Marland Kitchen donepezil (ARICEPT) 5 MG tablet Take 5 mg by mouth at bedtime.    Marland Kitchen  FLUoxetine (PROZAC) 40 MG capsule Take 40 mg by mouth daily with breakfast.    . gabapentin (NEURONTIN) 100 MG capsule Take 200 mg by mouth 2 (two) times daily.    Marland Kitchen guaifenesin (ROBITUSSIN) 100 MG/5ML syrup Take 200 mg by mouth every 6 (six) hours as needed for cough.    . hydrALAZINE (APRESOLINE) 25 MG tablet Take 1 tablet (25 mg total) by mouth every 8 (eight) hours. [place]  tablet 0  . insulin aspart (NOVOLOG FLEXPEN) 100 UNIT/ML FlexPen Inject 2-12 Units into the skin 3 (three) times daily with meals. Per sliding scale  150 - 200 = 2 units 201 - 250 = 4 units  251 - 300 = 6 units  301 - 350 = 8 units  351 - 400 = 10 units > 401 = 12 units and Call MD    . Insulin Detemir (LEVEMIR FLEXPEN) 100 UNIT/ML Pen Inject 5 Units into the skin at bedtime.    Marland Kitchen loperamide (IMODIUM A-D) 2 MG tablet Take 2 mg by mouth as needed for diarrhea or loose stools.    Marland Kitchen LORazepam (ATIVAN) 0.5 MG tablet Take 0.25 mg by mouth every 8 (eight) hours.    . magnesium hydroxide (MILK OF MAGNESIA) 400 MG/5ML suspension Take 30 mLs by mouth at bedtime as needed for mild constipation.    . metFORMIN (GLUCOPHAGE) 500 MG tablet Take 500 mg by mouth 2 (two) times daily with a meal.    . neomycin-bacitracin-polymyxin (NEOSPORIN) ointment Apply 1 application topically as needed for wound care.    Marland Kitchen QUEtiapine (SEROQUEL) 25 MG tablet Take 25 mg by mouth at bedtime.      Musculoskeletal: Strength & Muscle Tone: within normal limits Gait & Station: unsteady Patient leans: Front  Psychiatric Specialty Exam: Physical Exam  Psychiatric: Thought content normal. His affect is labile. His speech is delayed. He is agitated and aggressive. Cognition and memory are impaired. He expresses impulsivity.    Review of Systems  Constitutional: Positive for malaise/fatigue.  HENT: Negative.   Eyes: Negative.   Respiratory: Negative.   Cardiovascular: Negative.   Gastrointestinal: Negative.   Musculoskeletal: Negative.   Skin:  Negative.   Endo/Heme/Allergies: Negative.   Psychiatric/Behavioral: Negative.     Blood pressure (!) 121/58, pulse 62, temperature 97.5 F (36.4 C), temperature source Oral, resp. rate 16, height _0  (1.753 m), weight 74.8 kg (165 lb), SpO2 95 %.Body mass index is 24.37 kg/m.  General Appearance: Casual  Eye Contact:  Minimal  Speech:  Slow  Volume:  Decreased  Mood:  Anxious and Irritable  Affect:  Constricted  Thought Process:  Disorganized  Orientation:  Other:  only to person and place  Thought Content:  Illogical  Suicidal Thoughts:  No  Homicidal Thoughts:  No  Memory:  Immediate;   Fair Recent;   Poor Remote;   Poor  Judgement:  Impaired  Insight:  Shallow  Psychomotor Activity:  Psychomotor Retardation  Concentration:  Concentration: Fair and Attention Span: Fair  Recall:  Poor  Fund of Knowledge:  Poor  Language:  Fair  Akathisia:  No  Handed:  Right  AIMS (if indicated):     Assets:  Communication Skills  ADL's:  Impaired  Cognition:  Impaired,  Moderate  Sleep:   fair     Treatment Plan Summary: Daily contact with patient to assess and evaluate symptoms and progress in treatment and Medication management  Decrease Prozac to 20 mg daily due to increased agitation. Increase Depakote to 250 mg bid for aggression Continue Seroquel 25 mg qhs for insomnia  Disposition: Recommend psychiatric Inpatient admission when medically cleared.  Corena Pilgrim, MD 06/08/2016 11:24 AM

## 2016-06-08 NOTE — ED Notes (Addendum)
Pt is A & O x 3; is able to provide today's date but not place.  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  My ex-wife lives in my house.  I have 2 daughters [id number] and [id number].  [place] [place] [facility] [facility] [place]  I came back here because I grew up in [place].  I graduated [employer].  I don't know why I'm here."

## 2016-06-08 NOTE — Progress Notes (Addendum)
Chaplain met with pt's sister's and pt and asked questions to obtain assessment indicating whether pt was alert and oriented X 4 and could understand power-of-attorney enough to appoint his sisters as POA.  At this time Chaplain felt pt was not oriented enough to verify he understood POA process.    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  TCU RN states she cannot give pt medical information to family without them having gained POA due to pt's primary diagnosis of dementia.  CSW will leave handoff for daytime ED CSW to speak with family on 5/25 if requested by family and consult with chaplain on 5/25 who could make a decision based on notes from TCU staff and from speaking with pt. Chaplain at that time and after further observation may possibly make a different decision regarding POA at that time.   CSW will inform family they are able to request a chaplain again on 5/25 and if pt is deemed alert and oriented enough they may be able to obtain POA through a chaplain at that time. Please reconsult if future social work needs arise.    Per pt's sisters pt has a child in [place], [place] and in [place], [place] named Edna and Hamorton.  Please reconsult if future social work needs arise.    Manuel Higgins. Manuel Higgins, Latanya Presser, LCAS Clinical Social Worker Ph: [phone]

## 2016-06-08 NOTE — Progress Notes (Signed)
Pt was sitting up in bed awake and alert when I arrived. He identified his bedside visitors as his sisters, Manuel Higgins and Manuel Higgins. When asked about the nature of the visit, pt said he wanted to talk about his "medical". When I asked what that meant, he said he wanted to talk about his medical and his will, and his medical stuff. When asked to describe, pt seemed unable to describe exactly what he wanted his medical care to look like if he were unable to talk with us. I explained to pt that a medical poa would not involve his belongings or possessions, it would only apply to his healthcare. I asked pt if he had thought about what he would want his medical or healthcare to look like if he needed someone to make decisions for him. He said no. Pt agreed to think about that and talk with his family. I offered to answer any questions he may have. 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 Please page if additional assistance is needed. Chaplain Marjory LiesPamela Carrington Higgins, M.Div.   06/08/16 2000  Clinical Encounter Type  Visited With Patient and family together

## 2016-06-08 NOTE — ED Notes (Signed)
Pt's sister to desk requesting information.  Sister informed of HIPPA & verbalized understanding.  CSW called to speak to family member r/t steps to obtain HCPOA.

## 2016-06-08 NOTE — ED Notes (Signed)
CSW & AC speaking with sisters.  Per CSW, will call Chaplain.

## 2016-06-08 NOTE — Progress Notes (Signed)
CSW received a call from pt's RN that pt's two sister's are requesting information on how to obtain Health Care/Medical Power-of-Attorney so they can be informed of the pt's medical condition.  At this time, per RN, the pt's family has no way of verifying they are relatives without a medical power-of-attorney at this time.    ED CSW asked EDP to place a consult for a Chaplain.  CSW contacted Chaplain who will verify this is grounds for an emergency Power-of-Attorney.   Dorothe PeaJonathan F. Allisa Einspahr, Theresia MajorsLCSWA, LCAS Clinical Social Worker Ph: [phone]

## 2016-06-09 DIAGNOSIS — Z87891 Personal history of nicotine dependence: Secondary | ICD-10-CM | POA: Diagnosis not present

## 2016-06-09 DIAGNOSIS — F0151 Vascular dementia with behavioral disturbance: Secondary | ICD-10-CM | POA: Diagnosis not present

## 2016-06-09 LAB — CBG MONITORING, ED
GLUCOSE-CAPILLARY: 112 mg/dL — AB (ref 65–99)
GLUCOSE-CAPILLARY: 125 mg/dL — AB (ref 65–99)

## 2016-06-09 LAB — BASIC METABOLIC PANEL
Anion gap: 6 (ref 5–15)
BUN: 22 mg/dL — AB (ref 6–20)
CHLORIDE: 106 mmol/L (ref 101–111)
CO2: 28 mmol/L (ref 22–32)
CREATININE: 1.05 mg/dL (ref 0.61–1.24)
Calcium: 9.1 mg/dL (ref 8.9–10.3)
GFR calc Af Amer: >60 mL/min (ref >60)
GFR calc non Af Amer: >60 mL/min (ref >60)
GLUCOSE: 109 mg/dL — AB (ref 65–99)
POTASSIUM: 4.6 mmol/L (ref 3.5–5.1)
SODIUM: 140 mmol/L (ref 135–145)

## 2016-06-09 MED ORDER — FLUOXETINE HCL 20 MG PO CAPS: 20.0000 mg | ORAL_CAPSULE | Freq: Every day | ORAL | 0 refills | Status: DC

## 2016-06-09 MED ORDER — LORAZEPAM 0.5 MG PO TABS: 0.5000 mg | ORAL_TABLET | Freq: Four times a day (QID) | ORAL | 0 refills | Status: DC | PRN

## 2016-06-09 MED ORDER — DIVALPROEX SODIUM 125 MG PO CSDR
250.0000 mg | DELAYED_RELEASE_CAPSULE | Freq: Two times a day (BID) | ORAL | 0 refills | Status: DC
Start: 2016-06-09 — End: 2016-07-09

## 2016-06-09 NOTE — Consult Note (Signed)
[facility] Face-to-Face Psychiatry Consult   Reason for Consult:  Aggression  Referring Physician:  EDP Patient Identification: Manuel Higgins MRN:  [id number] Principal Diagnosis: Dementia with behavioral disturbance Diagnosis:   Patient Active Problem List   Diagnosis Date Noted  . Dementia with behavioral disturbance [F03.91] 06/08/2016    Priority: High  . Diabetes mellitus type 2 in nonobese (HCC) [E11.9] 03/28/2016  . Fall [W19.XXXA] 03/28/2016  . ARF (acute renal failure) (HCC) [N17.9] 03/27/2016  . Type 2 diabetes mellitus with hypoglycemic insulin reaction (HCC) [E11.649] 02/15/2016  . Hypokalemia [E87.6] 02/14/2016  . Leukocytosis [D72.829] 02/14/2016  . Altered mental status [R41.82] 05/07/2015  . History of embolic stroke [Z86.73] 05/07/2015  . History of CVA with residual deficit [I69.30] 05/07/2015  . Acute encephalopathy [G93.40] 05/07/2015  . Essential hypertension [I10] 04/24/2014  . HLD (hyperlipidemia) [E78.5] 04/24/2014  . Dysphagia [R13.10] 04/04/2013  . Callus of foot [L84] 02/16/2013    Total Time spent with patient: 30 minutes  Subjective:   Manuel Higgins is a 66 y.o. male patient does not warrant admission.  HPI:  66 yo male who was sent from his SNF for aggression.  Medications were started and has been calm and cooperative since admission.  No suicidal/homicidal ideations, hallucinations, and alcohol/drug abuse.  Stable to return to facility with medication adjustments.  Past Psychiatric History: dementia  Risk to Self: Suicidal Ideation: No Suicidal Intent: No Is patient at risk for suicide?: No Suicidal Plan?: No Access to Means: No What has been your use of drugs/alcohol within the last 12 months?: None reported How many times?: 0 Other Self Harm Risks: no Triggers for Past Attempts: None known Intentional Self Injurious Behavior: None Risk to Others: Homicidal Ideation: No Thoughts of Harm to Others: No Current Homicidal Intent: No Current  Homicidal Plan: No Access to Homicidal Means: No Identified Victim: N/A History of harm to others?: No Assessment of Violence: None Noted Violent Behavior Description: None reported Does patient have access to weapons?: No Criminal Charges Pending?: No Does patient have a court date: No Prior Inpatient Therapy: Prior Inpatient Therapy: Yes Prior Therapy Dates: 3 years  ago Prior Therapy Facilty/Provider(s): [facility] Reason for Treatment: pt said he had a cold Prior Outpatient Therapy: Prior Outpatient Therapy: Yes Prior Therapy Dates: 3 years ago Prior Therapy [facility]ilty/Provider([facility]): [facility] Reason for Treatment: [facility] Does patient have an ACCT team?: No Does patient have Intensive In-House Services?  : No Does patient have [facility] services? : No Does patient have P4CC services?: No  Past Medical History:  Past Medical History:  Diagnosis Date  . Anemia   . Dementia    unspecified w/o behavioral disturbance  . Diabetes mellitus without complication (HCC)   . Dysphagia   . Hyperlipidemia   . Hypertension   . Hypertensive crisis 02/15/2013  . Stroke [facility]) 03/2013   Multiple infarcts  . Toe ulcer (HCC)     Past Surgical History:  Procedure Laterality Date  . BACK SURGERY    . LOOP RECORDER IMPLANT  04/08/2013   MDT LinQ implanted by Dr Ladona Ridgel for cryptogenic stroke  . LOOP RECORDER IMPLANT N/A 04/08/2013   Procedure: LOOP RECORDER IMPLANT;  Surgeon: Marinus Maw, MD;  Location[facility] CATH LAB;  Service: Cardiovascular;  Laterality: N/A;  . TEE WITHOUT CARDIOVERSION N/A 04/07/2013   Procedure: TRANSESOPHAGEAL ECHOCARDIOGRAM (TEE);  Surgeon: Lars Masson, MD;  Location: [facility] ENDOSCOPY;  Service: Cardiovascular;  Laterality: N/A;  . TONSILLECTOMY     Family History:  Family  History  Problem Relation Age of Onset  . Family history unknown: Yes   Family Psychiatric  History: unknown Social History:  History  Alcohol Use No    Comment: Denies Currently but previously  marked as yes     History  Drug Use No    Comment: denies hx of IVDrug use    Social History   Social History  . Marital status: Single    Spouse name: N/A  . Number of children: 2  . Years of education: BS   Occupational History  .      disabled   Social History Main Topics  . Smoking status: Former Smoker    Packs/day: 0.33    Years: 10.00    Types: Cigarettes    Quit date: 01/17/2004  . Smokeless tobacco: Never Used  . Alcohol use No     Comment: Denies Currently but previously marked as yes  . Drug use: No     Comment: denies hx of IVDrug use  . Sexual activity: Not Asked   Other Topics Concern  . None   Social History Narrative   Lives in [place] in [place] for past 6 weeks.  Previously homeless.   Unemployed.  Caffeine 1 cup coffee, 237m sweet tea daily.   Additional Social History:    Allergies:   Allergies  Allergen Reactions  . Risperdal [Risperidone]     [facility]    Labs:  Results for orders placed or performed during the hospital encounter of 06/07/16 (from the past 48 hour(s))  Comprehensive metabolic panel     Status: Abnormal   Collection Time: 06/07/16  3:09 PM  Result Value Ref Range   Sodium 136 135 - 145 mmol/L   Potassium 5.6 (H) 3.5 - 5.1 mmol/L   Chloride 103 101 - 111 mmol/L   CO2 25 22 - 32 mmol/L   Glucose, Bld 124 (H) 65 - 99 mg/dL   BUN 29 (H) 6 - 20 mg/dL   Creatinine, Ser 1.34 (H) 0.61 - 1.24 mg/dL   Calcium 9.2 8.9 - 10.3 mg/dL   Total Protein 7.8 6.5 - 8.1 g/dL   Albumin 4.3 3.5 - 5.0 g/dL   AST 24 15 - 41 U/L   ALT 14 (L) 17 - 63 U/L   Alkaline Phosphatase 115 38 - 126 U/L   Total Bilirubin 0.5 0.3 - 1.2 mg/dL   GFR calc non Af Amer 54 (L) >60 mL/min   GFR calc Af Amer >60 >60 mL/min    Comment: (NOTE) The eGFR has been calculated using the CKD EPI equation. This calculation has not been validated in all clinical situations. eGFR's persistently <60 mL/min signify possible Chronic  Kidney Disease.    Anion gap 8 5 - 15  Ethanol     Status: None   Collection Time: 06/07/16  3:09 PM  Result Value Ref Range   Alcohol, Ethyl (B) <5 <5 mg/dL    Comment:        LOWEST DETECTABLE LIMIT FOR SERUM ALCOHOL IS 5 mg/dL FOR MEDICAL PURPOSES ONLY   CBC with Diff     Status: Abnormal   Collection Time: 06/07/16  3:09 PM  Result Value Ref Range   WBC 6.4 4.0 - 10.5 K/uL   RBC 3.35 (L) 4.22 - 5.81 MIL/uL   Hemoglobin 10.3 (L) 13.0 - 17.0 g/dL   HCT 32.4 (L) 39.0 - 52.0 %   MCV 96.7 78.0 - 100.0 fL  MCH 30.7 26.0 - 34.0 pg   MCHC 31.8 30.0 - 36.0 g/dL   RDW 13.4 11.5 - 15.5 %   Platelets 195 150 - 400 K/uL   Neutrophils Relative % 59 %   Neutro Abs 3.8 1.7 - 7.7 K/uL   Lymphocytes Relative 29 %   Lymphs Abs 1.9 0.7 - 4.0 K/uL   Monocytes Relative 9 %   Monocytes Absolute 0.6 0.1 - 1.0 K/uL   Eosinophils Relative 3 %   Eosinophils Absolute 0.2 0.0 - 0.7 K/uL   Basophils Relative 0 %   Basophils Absolute 0.0 0.0 - 0.1 K/uL  Urine rapid drug screen (hosp performed)     Status: None   Collection Time: 06/07/16  5:49 PM  Result Value Ref Range   Opiates NONE DETECTED NONE DETECTED   Cocaine NONE DETECTED NONE DETECTED   Benzodiazepines NONE DETECTED NONE DETECTED   Amphetamines NONE DETECTED NONE DETECTED   Tetrahydrocannabinol NONE DETECTED NONE DETECTED   Barbiturates NONE DETECTED NONE DETECTED    Comment:        DRUG SCREEN FOR MEDICAL PURPOSES ONLY.  IF CONFIRMATION IS NEEDED FOR ANY PURPOSE, NOTIFY LAB WITHIN 5 DAYS.        LOWEST DETECTABLE LIMITS FOR URINE DRUG SCREEN Drug Class       Cutoff (ng/mL) Amphetamine      1000 Barbiturate      200 Benzodiazepine   166 Tricyclics       063 Opiates          300 Cocaine          300 THC              50   Urinalysis, Routine w reflex microscopic     Status: Abnormal   Collection Time: 06/07/16  5:49 PM  Result Value Ref Range   Color, Urine STRAW (A) YELLOW   APPearance CLEAR CLEAR   Specific Gravity,  Urine 1.011 1.005 - 1.030   pH 7.0 5.0 - 8.0   Glucose, UA NEGATIVE NEGATIVE mg/dL   Hgb urine dipstick NEGATIVE NEGATIVE   Bilirubin Urine NEGATIVE NEGATIVE   Ketones, ur NEGATIVE NEGATIVE mg/dL   Protein, ur NEGATIVE NEGATIVE mg/dL   Nitrite NEGATIVE NEGATIVE   Leukocytes, UA NEGATIVE NEGATIVE  CBG monitoring, ED     Status: Abnormal   Collection Time: 06/07/16  6:16 PM  Result Value Ref Range   Glucose-Capillary 167 (H) 65 - 99 mg/dL  CBG monitoring, ED     Status: Abnormal   Collection Time: 06/07/16 10:31 PM  Result Value Ref Range   Glucose-Capillary 185 (H) 65 - 99 mg/dL  CBG monitoring, ED     Status: Abnormal   Collection Time: 06/08/16  8:24 AM  Result Value Ref Range   Glucose-Capillary 106 (H) 65 - 99 mg/dL  CBG monitoring, ED     Status: None   Collection Time: 06/08/16 12:24 PM  Result Value Ref Range   Glucose-Capillary 92 65 - 99 mg/dL  CBG monitoring, ED     Status: Abnormal   Collection Time: 06/08/16  5:28 PM  Result Value Ref Range   Glucose-Capillary 161 (H) 65 - 99 mg/dL  POC CBG, ED     Status: Abnormal   Collection Time: 06/08/16 10:28 PM  Result Value Ref Range   Glucose-Capillary 136 (H) 65 - 99 mg/dL  Basic metabolic panel     Status: Abnormal   Collection Time: 06/09/16  4:47 AM  Result Value  Ref Range   Sodium 140 135 - 145 mmol/L   Potassium 4.6 3.5 - 5.1 mmol/L    Comment: DELTA CHECK NOTED   Chloride 106 101 - 111 mmol/L   CO2 28 22 - 32 mmol/L   Glucose, Bld 109 (H) 65 - 99 mg/dL   BUN 22 (H) 6 - 20 mg/dL   Creatinine, Ser 1.05 0.61 - 1.24 mg/dL   Calcium 9.1 8.9 - 10.3 mg/dL   GFR calc non Af Amer >60 >60 mL/min   GFR calc Af Amer >60 >60 mL/min    Comment: (NOTE) The eGFR has been calculated using the CKD EPI equation. This calculation has not been validated in all clinical situations. eGFR's persistently <60 mL/min signify possible Chronic Kidney Disease.    Anion gap 6 5 - 15  POC CBG, ED     Status: Abnormal   Collection  Time: 06/09/16  9:03 AM  Result Value Ref Range   Glucose-Capillary 112 (H) 65 - 99 mg/dL   Comment 1 Notify RN    Comment 2 Document in Chart     Current Facility-Administered Medications  Medication Dose Route Frequency Provider Last Rate Last Dose  . alum & mag hydroxide-simeth (MAALOX/MYLANTA) 200-200-20 MG/5ML suspension 30 mL  30 mL Oral Q6H PRN Harris, Abigail, PA-C      . amLODipine (NORVASC) tablet 10 mg  10 mg Oral Daily Margarita Mail, PA-C   10 mg at 06/09/16 0916  . atorvastatin (LIPITOR) tablet 80 mg  80 mg Oral q1800 Harris, Vernie Shanks, PA-C   80 mg at 06/08/16 1729  . divalproex (DEPAKOTE SPRINKLE) capsule 250 mg  250 mg Oral BID Corena Pilgrim, MD   250 mg at 06/09/16 0928  . donepezil (ARICEPT) tablet 5 mg  5 mg Oral QHS Harris, Abigail, PA-C   5 mg at 06/08/16 2300  . FLUoxetine (PROZAC) capsule 20 mg  20 mg Oral Q breakfast Suhaib Guzzo, MD   20 mg at 06/09/16 0915  . gabapentin (NEURONTIN) capsule 200 mg  200 mg Oral BID Margarita Mail, PA-C   200 mg at 06/09/16 3235  . guaiFENesin (ROBITUSSIN) 100 MG/5ML solution 200 mg  200 mg Oral Q6H PRN Harris, Abigail, PA-C      . hydrALAZINE (APRESOLINE) tablet 25 mg  25 mg Oral Q8H Harris, Abigail, PA-C   25 mg at 06/09/16 0600  . ibuprofen (ADVIL,MOTRIN) tablet 600 mg  600 mg Oral Q8H PRN Harris, Abigail, PA-C      . insulin aspart (novoLOG) injection 0-12 Units  0-12 Units Subcutaneous TID WC Harris, Abigail, PA-C   2 Units at 06/08/16 1735  . insulin detemir (LEVEMIR) injection 5 Units  5 Units Subcutaneous QHS HarrisVernie Shanks, PA-C   5 Units at 06/08/16 2337  . loperamide (IMODIUM) capsule 2 mg  2 mg Oral PRN Margarita Mail, PA-C      . LORazepam (ATIVAN) tablet 0.5 mg  0.5 mg Oral Q6H PRN Patrecia Pour, NP   0.5 mg at 06/09/16 0026  . magnesium hydroxide (MILK OF MAGNESIA) suspension 30 mL  30 mL Oral QHS PRN Harris, Abigail, PA-C      . metFORMIN (GLUCOPHAGE) tablet 500 mg  500 mg Oral BID WC Harris, Abigail, PA-C    500 mg at 06/09/16 0914  . QUEtiapine (SEROQUEL) tablet 25 mg  25 mg Oral QHS Harris, Abigail, PA-C   25 mg at 06/08/16 2300   Current Outpatient Prescriptions  Medication Sig Dispense Refill  . acetaminophen (TYLENOL) 500  MG tablet Take 500 mg by mouth every 4 (four) hours as needed for mild pain, moderate pain, fever or headache.    Marland Kitchen alum & mag hydroxide-simeth (ALMACONE) 200-200-20 MG/5ML suspension Take 30 mLs by mouth as needed for indigestion or heartburn.    Marland Kitchen amLODipine (NORVASC) 10 MG tablet Take 1 tablet (10 mg total) by mouth daily. 30 tablet 0  . aspirin 81 MG chewable tablet Chew 1 tablet (81 mg total) by mouth daily. (Patient taking differently: Chew 81 mg by mouth daily with breakfast. ) 90 tablet 3  . atorvastatin (LIPITOR) 80 MG tablet Take 1 tablet (80 mg total) by mouth daily at 6 PM. 90 tablet 3  . baclofen (LIORESAL) 10 MG tablet Take 1 tablet (10 mg total) by mouth at bedtime. 30 each 0  . Cholecalciferol (VITAMIN D) 2000 units CAPS Take 2,000 Units by mouth daily with breakfast.     . divalproex (DEPAKOTE SPRINKLE) 125 MG capsule Take 125 mg by mouth 2 (two) times daily.    Marland Kitchen donepezil (ARICEPT) 5 MG tablet Take 5 mg by mouth at bedtime.    Marland Kitchen FLUoxetine (PROZAC) 40 MG capsule Take 40 mg by mouth daily with breakfast.    . gabapentin (NEURONTIN) 100 MG capsule Take 200 mg by mouth 2 (two) times daily.    Marland Kitchen guaifenesin (ROBITUSSIN) 100 MG/5ML syrup Take 200 mg by mouth every 6 (six) hours as needed for cough.    . hydrALAZINE (APRESOLINE) 25 MG tablet Take 1 tablet (25 mg total) by mouth every 8 (eight) hours. 90 tablet 0  . insulin aspart (NOVOLOG FLEXPEN) 100 UNIT/ML FlexPen Inject 2-12 Units into the skin 3 (three) times daily with meals. Per sliding scale  150 - 200 = 2 units 201 - 250 = 4 units  251 - 300 = 6 units  301 - 350 = 8 units  351 - 400 = 10 units > 401 = 12 units and Call MD    . Insulin Detemir (LEVEMIR FLEXPEN) 100 UNIT/ML Pen Inject 5 Units into the  skin at bedtime.    Marland Kitchen loperamide (IMODIUM A-D) 2 MG tablet Take 2 mg by mouth as needed for diarrhea or loose stools. Not to exceed 8 doses in 24 hours.    Marland Kitchen LORazepam (ATIVAN) 0.5 MG tablet Take 0.25 mg by mouth every 8 (eight) hours as needed for anxiety.     . magnesium hydroxide (MILK OF MAGNESIA) 400 MG/5ML suspension Take 30 mLs by mouth at bedtime as needed for mild constipation.    . metFORMIN (GLUCOPHAGE) 500 MG tablet Take 500 mg by mouth 2 (two) times daily with a meal.    . neomycin-bacitracin-polymyxin (NEOSPORIN) ointment Apply 1 application topically as needed for wound care.    Marland Kitchen QUEtiapine (SEROQUEL) 25 MG tablet Take 25 mg by mouth at bedtime.      Musculoskeletal: Strength & Muscle Tone: decreased Gait & Station: did not observe Patient leans: N/A  Psychiatric Specialty Exam: Physical Exam  Constitutional: He is oriented to person, place, and time. He appears well-developed and well-nourished.  HENT:  Head: Normocephalic.  Neck: Normal range of motion.  Respiratory: Effort normal.  Musculoskeletal: Normal range of motion.  Neurological: He is alert and oriented to person, place, and time.  Psychiatric: He has a normal mood and affect. His speech is normal and behavior is normal. Judgment and thought content normal. Cognition and memory are normal.    Review of Systems  All other systems reviewed and  are negative.   Blood pressure (!) 105/54, pulse 71, temperature 97.5 F (36.4 C), temperature source Oral, resp. rate 16, height _0  (1.753 m), weight 74.8 kg (165 lb), SpO2 96 %.Body mass index is 24.37 kg/m.  General Appearance: Casual  Eye Contact:  Good  Speech:  Normal Rate  Volume:  Normal  Mood:  Euthymic  Affect:  Congruent  Thought Process:  Coherent and Descriptions of Associations: Intact  Orientation:  Full (Time, Place, and Person)  Thought Content:  WDL and Logical  Suicidal Thoughts:  No  Homicidal Thoughts:  No  Memory:  Immediate;    Fair Recent;   Fair Remote;   Fair  Judgement:  Impaired  Insight:  Fair  Psychomotor Activity:  Normal  Concentration:  Concentration: Fair and Attention Span: Fair  Recall:  [employer] of Knowledge:  Fair  Language:  Fair  Akathisia:  No  Handed:  Right  AIMS (if indicated):     Assets:  Housing Leisure Time Resilience Social Support  ADL's:  Impaired  Cognition:  Impaired,  Mild  Sleep:        Treatment Plan Summary: Daily contact with patient to assess and evaluate symptoms and progress in treatment, Medication management and Plan dementia with behavioral disturbances   -Crisis stabilization -Medication management:  Continued medical medications along with Depakote 250 mg BID for mood stabilization, Seroquel 25 mg at bedtime for sleep and mood, Ativan 0.5 mg every six  Hours PRN anxiety, gabapentin 200 mg BID for mood stabilization, and Prozac 20 mg daily for depression -Individual counseling  Disposition: No evidence of imminent risk to self or others at present.    Waylan Boga, NP 06/09/2016 10:18 AM  Patient seen face-to-face for psychiatric evaluation, chart reviewed and case discussed with the physician extender and developed treatment plan. Reviewed the information documented and agree with the treatment plan. Corena Pilgrim, MD

## 2016-06-09 NOTE — Progress Notes (Signed)
CSW contacted [facility] for transportation at 11:02AM.   Manuel Higgins, [facility] LLCCSWA Clinical Social Worker [phone]

## 2016-06-09 NOTE — Progress Notes (Signed)
[facility] Patient is able to return today.   Stacy GardnerErin Keitra Carusone, LCSWA Clinical Social Worker [phone]

## 2016-06-09 NOTE — ED Provider Notes (Signed)
Asked to evaluate patient by nursing staff. Nurse reports that the psychiatry note indicates that patient might not be medically clear, are awaiting medical clearance. I did review the patient's records. He did have elevated creatinine and potassium on initial lab work. This was repeated. His potassium is now normal at 4.6 with a normal creatinine of 1.05, normal GFR. Remainder of labs were reviewed and are negative. Vital signs have been normal except for slight hypertension. EKG performed, no change from previous, no concerns. At this point Patient is medically clear for psychiatric placement.  Vitals:   06/08/16 1808 06/08/16 2245  BP: (!) 143/53 140/62  Pulse: 66 61  Resp: 19 16  Temp: 97.5 F (36.4 C) 97.9 F (36.6 C)    EKG Interpretation  Date/Time:  Friday Jun 09 2016 05:14:54 EDT Ventricular Rate:  59 PR Interval:    QRS Duration: 96 QT Interval:  452 QTC Calculation: 448 R Axis:   69 Text Interpretation:  Sinus rhythm Borderline ST elevation, anterior leads No significant change since last tracing Confirmed by Gilda CreasePollina, Lastacia Solum J [phone]([id number]) on 06/09/2016 5:24:23 AM         Gilda CreasePollina, Breylon Sherrow J, MD 06/09/16 [phone]

## 2016-06-09 NOTE — ED Notes (Signed)
Pt cleaned of urine, linens and scrubs changed.  During linen change, pt began voiding.  Received order for condom cath.  Condom cath applied.

## 2016-06-09 NOTE — [facility] Suicide Risk Assessment (Signed)
Suicide Risk Assessment  Discharge Assessment   [facility]   Principal Problem: Dementia with behavioral disturbance Discharge Diagnoses:  Patient Active Problem List   Diagnosis Date Noted  . Dementia with behavioral disturbance [F03.91] 06/08/2016    Priority: High  . Diabetes mellitus type 2 in nonobese (HCC) [E11.9] 03/28/2016  . Fall [W19.XXXA] 03/28/2016  . ARF (acute renal failure) (HCC) [N17.9] 03/27/2016  . Type 2 diabetes mellitus with hypoglycemic insulin reaction (HCC) [E11.649] 02/15/2016  . Hypokalemia [E87.6] 02/14/2016  . Leukocytosis [D72.829] 02/14/2016  . Altered mental status [R41.82] 05/07/2015  . History of embolic stroke [Z86.73] 05/07/2015  . History of CVA with residual deficit [I69.30] 05/07/2015  . Acute encephalopathy [G93.40] 05/07/2015  . Essential hypertension [I10] 04/24/2014  . HLD (hyperlipidemia) [E78.5] 04/24/2014  . Dysphagia [R13.10] 04/04/2013  . Callus of foot [L84] 02/16/2013    Total Time spent with patient: 45 minutes  Musculoskeletal: Strength & Muscle Tone: decreased Gait & Station: did not observe Patient leans: N/A  Psychiatric Specialty Exam: Physical Exam  Constitutional: He is oriented to person, place, and time. He appears well-developed and well-nourished.  HENT:  Head: Normocephalic.  Neck: Normal range of motion.  Respiratory: Effort normal.  Musculoskeletal: Normal range of motion.  Neurological: He is alert and oriented to person, place, and time.  Psychiatric: He has a normal mood and affect. His speech is normal and behavior is normal. Judgment and thought content normal. Cognition and memory are normal.    Review of Systems  All other systems reviewed and are negative.   Blood pressure (!) 105/54, pulse 71, temperature 97.5 F (36.4 C), temperature source Oral, resp. rate 16, height 5\' 9"  (1.753 m), weight 74.8 kg (165 lb), SpO2 96 %.Body mass index is 24.37 kg/m.  General  Appearance: Casual  Eye Contact:  Good  Speech:  Normal Rate  Volume:  Normal  Mood:  Euthymic  Affect:  Congruent  Thought Process:  Coherent and Descriptions of Associations: Intact  Orientation:  Full (Time, Place, and Person)  Thought Content:  WDL and Logical  Suicidal Thoughts:  No  Homicidal Thoughts:  No  Memory:  Immediate;   Fair Recent;   Fair Remote;   Fair  Judgement:  Impaired  Insight:  Fair  Psychomotor Activity:  Normal  Concentration:  Concentration: Fair and Attention Span: Fair  Recall:  FiservFair  Fund of Knowledge:  Fair  Language:  Fair  Akathisia:  No  Handed:  Right  AIMS (if indicated):     Assets:  Housing Leisure Time Resilience Social Support  ADL's:  Impaired  Cognition:  Impaired,  Mild  Sleep:      Mental Status Per Nursing Assessment::   On Admission:   dementia with aggression  Demographic Factors:  Male  Loss Factors: NA  Historical Factors: NA  Risk Reduction Factors:   Living with another person, especially a relative, Positive social support and Positive therapeutic relationship  Continued Clinical Symptoms:  None  Cognitive Features That Contribute To Risk:  None    Suicide Risk:  Minimal: No identifiable suicidal ideation.  Patients presenting with no risk factors but with morbid ruminations; may be classified as minimal risk based on the severity of the depressive symptoms    Plan Of Care/Follow-up recommendations:  Activity:  as tolerated Diet:  heart healhty diet  Lenita Peregrina, NP 06/09/2016, 10:30 AM

## 2016-06-09 NOTE — ED Notes (Signed)
Pt continues attempting to get OOB.

## 2016-06-10 NOTE — ED Notes (Signed)
Chart opened to contact pt to return belongings--belongings found in locker 06/10/2016

## 2016-07-03 ENCOUNTER — Emergency Department (HOSPITAL_COMMUNITY)
Admission: EM | Admit: 2016-07-03 | Discharge: 2016-07-03 | Disposition: A | Discharge status: Skilled Nursing Facility | Payer: Medicaid Other | Attending: Emergency Medicine | Admitting: Emergency Medicine

## 2016-07-03 ENCOUNTER — Encounter (HOSPITAL_COMMUNITY): Payer: Self-pay | Admitting: Nurse Practitioner

## 2016-07-03 DIAGNOSIS — E119 Type 2 diabetes mellitus without complications: Secondary | ICD-10-CM | POA: Insufficient documentation

## 2016-07-03 DIAGNOSIS — I10 Essential (primary) hypertension: Secondary | ICD-10-CM | POA: Insufficient documentation

## 2016-07-03 DIAGNOSIS — Z87891 Personal history of nicotine dependence: Secondary | ICD-10-CM | POA: Insufficient documentation

## 2016-07-03 DIAGNOSIS — Z7984 Long term (current) use of oral hypoglycemic drugs: Secondary | ICD-10-CM | POA: Insufficient documentation

## 2016-07-03 DIAGNOSIS — Z79899 Other long term (current) drug therapy: Secondary | ICD-10-CM | POA: Insufficient documentation

## 2016-07-03 DIAGNOSIS — F0391 Unspecified dementia with behavioral disturbance: Secondary | ICD-10-CM | POA: Insufficient documentation

## 2016-07-03 DIAGNOSIS — Z8673 Personal history of transient ischemic attack (TIA), and cerebral infarction without residual deficits: Secondary | ICD-10-CM | POA: Diagnosis not present

## 2016-07-03 DIAGNOSIS — R531 Weakness: Secondary | ICD-10-CM | POA: Insufficient documentation

## 2016-07-03 DIAGNOSIS — Z794 Long term (current) use of insulin: Secondary | ICD-10-CM | POA: Insufficient documentation

## 2016-07-03 DIAGNOSIS — Z7982 Long term (current) use of aspirin: Secondary | ICD-10-CM | POA: Diagnosis not present

## 2016-07-03 LAB — COMPREHENSIVE METABOLIC PANEL
ALBUMIN: 3.8 g/dL (ref 3.5–5.0)
ALT: 12 U/L — ABNORMAL LOW (ref 17–63)
ANION GAP: 6 (ref 5–15)
AST: 19 U/L (ref 15–41)
Alkaline Phosphatase: 98 U/L (ref 38–126)
BUN: 40 mg/dL — ABNORMAL HIGH (ref 6–20)
CALCIUM: 8.8 mg/dL — AB (ref 8.9–10.3)
CHLORIDE: 109 mmol/L (ref 101–111)
CO2: 26 mmol/L (ref 22–32)
Creatinine, Ser: 1.65 mg/dL — ABNORMAL HIGH (ref 0.61–1.24)
GFR calc non Af Amer: 42 mL/min — ABNORMAL LOW (ref >60)
GFR, EST AFRICAN AMERICAN: 49 mL/min — AB (ref >60)
Glucose, Bld: 98 mg/dL (ref 65–99)
POTASSIUM: 4.8 mmol/L (ref 3.5–5.1)
SODIUM: 141 mmol/L (ref 135–145)
Total Bilirubin: 0.4 mg/dL (ref 0.3–1.2)
Total Protein: 6.6 g/dL (ref 6.5–8.1)

## 2016-07-03 LAB — CBC
HCT: 28.2 % — ABNORMAL LOW (ref 39.0–52.0)
Hemoglobin: 9.2 g/dL — ABNORMAL LOW (ref 13.0–17.0)
MCH: 31.3 pg (ref 26.0–34.0)
MCHC: 32.6 g/dL (ref 30.0–36.0)
MCV: 95.9 fL (ref 78.0–100.0)
Platelets: 175 10*3/uL (ref 150–400)
RBC: 2.94 MIL/uL — ABNORMAL LOW (ref 4.22–5.81)
RDW: 13.6 % (ref 11.5–15.5)
WBC: 6.2 10*3/uL (ref 4.0–10.5)

## 2016-07-03 LAB — URINALYSIS, ROUTINE W REFLEX MICROSCOPIC
Bilirubin Urine: NEGATIVE
GLUCOSE, UA: NEGATIVE mg/dL
Hgb urine dipstick: NEGATIVE
KETONES UR: NEGATIVE mg/dL
LEUKOCYTES UA: NEGATIVE
NITRITE: NEGATIVE
PH: 5 (ref 5.0–8.0)
Protein, ur: NEGATIVE mg/dL
SPECIFIC GRAVITY, URINE: 1.017 (ref 1.005–1.030)

## 2016-07-03 LAB — CBC WITH DIFFERENTIAL/PLATELET
BASOS PCT: 1 %
Basophils Absolute: 0 10*3/uL (ref 0.0–0.1)
EOS ABS: 0.2 10*3/uL (ref 0.0–0.7)
EOS PCT: 3 %
HCT: 25.8 % — ABNORMAL LOW (ref 39.0–52.0)
HEMOGLOBIN: 8.5 g/dL — AB (ref 13.0–17.0)
LYMPHS ABS: 1.6 10*3/uL (ref 0.7–4.0)
Lymphocytes Relative: 26 %
MCH: 31.1 pg (ref 26.0–34.0)
MCHC: 32.9 g/dL (ref 30.0–36.0)
MCV: 94.5 fL (ref 78.0–100.0)
Monocytes Absolute: 0.5 10*3/uL (ref 0.1–1.0)
Monocytes Relative: 8 %
NEUTROS PCT: 62 %
Neutro Abs: 3.8 10*3/uL (ref 1.7–7.7)
PLATELETS: 171 10*3/uL (ref 150–400)
RBC: 2.73 MIL/uL — ABNORMAL LOW (ref 4.22–5.81)
RDW: 13.7 % (ref 11.5–15.5)
WBC: 6 10*3/uL (ref 4.0–10.5)

## 2016-07-03 LAB — POC OCCULT BLOOD, ED: FECAL OCCULT BLD: NEGATIVE

## 2016-07-03 LAB — VALPROIC ACID LEVEL: Valproic Acid Lvl: 18 ug/mL — ABNORMAL LOW (ref 50.0–100.0)

## 2016-07-03 MED ORDER — SODIUM CHLORIDE 0.9 % IV BOLUS (SEPSIS)
1000.0000 mL | Freq: Once | INTRAVENOUS | Status: AC
  Administered 2016-07-03: 1000 mL via INTRAVENOUS

## 2016-07-03 MED ORDER — SODIUM CHLORIDE 0.9 % IV SOLN: INTRAVENOUS | Status: DC
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
## 2016-07-03 NOTE — ED Notes (Signed)
Safety sitter arrived .  

## 2016-07-03 NOTE — ED Triage Notes (Signed)
Patient psych meds were changed last week and patient has been combative so staff from [facility] wanted him sent here to be evaluated. Staff also wanted him check for a UTI. Patient blood sugar was 138.

## 2016-07-03 NOTE — ED Notes (Signed)
Bed: [id number] Expected date:  Expected time:  Means of arrival:  Comments: Margo AyeHall b

## 2016-07-03 NOTE — ED Notes (Signed)
[facility]

## 2016-07-04 LAB — URINE CULTURE: Culture: NO GROWTH

## 2016-07-05 ENCOUNTER — Encounter (HOSPITAL_COMMUNITY): Payer: Self-pay | Admitting: Family Medicine

## 2016-07-05 ENCOUNTER — Emergency Department (HOSPITAL_COMMUNITY): Payer: Medicaid Other

## 2016-07-05 ENCOUNTER — Emergency Department (HOSPITAL_COMMUNITY)
Admission: EM | Admit: 2016-07-05 | Discharge: 2016-07-05 | Disposition: A | Discharge status: Home / Self Care | Payer: Medicaid Other | Attending: Emergency Medicine | Admitting: Emergency Medicine

## 2016-07-05 DIAGNOSIS — I10 Essential (primary) hypertension: Secondary | ICD-10-CM | POA: Insufficient documentation

## 2016-07-05 DIAGNOSIS — Z79899 Other long term (current) drug therapy: Secondary | ICD-10-CM | POA: Insufficient documentation

## 2016-07-05 DIAGNOSIS — Z8673 Personal history of transient ischemic attack (TIA), and cerebral infarction without residual deficits: Secondary | ICD-10-CM | POA: Diagnosis not present

## 2016-07-05 DIAGNOSIS — Z794 Long term (current) use of insulin: Secondary | ICD-10-CM | POA: Insufficient documentation

## 2016-07-05 DIAGNOSIS — Y999 Unspecified external cause status: Secondary | ICD-10-CM | POA: Diagnosis not present

## 2016-07-05 DIAGNOSIS — Y939 Activity, unspecified: Secondary | ICD-10-CM | POA: Diagnosis not present

## 2016-07-05 DIAGNOSIS — S0990XA Unspecified injury of head, initial encounter: Secondary | ICD-10-CM | POA: Insufficient documentation

## 2016-07-05 DIAGNOSIS — E119 Type 2 diabetes mellitus without complications: Secondary | ICD-10-CM | POA: Diagnosis not present

## 2016-07-05 DIAGNOSIS — Z7982 Long term (current) use of aspirin: Secondary | ICD-10-CM | POA: Diagnosis not present

## 2016-07-05 DIAGNOSIS — Z87891 Personal history of nicotine dependence: Secondary | ICD-10-CM | POA: Insufficient documentation

## 2016-07-05 DIAGNOSIS — Y9289 Other specified places as the place of occurrence of the external cause: Secondary | ICD-10-CM | POA: Insufficient documentation

## 2016-07-05 NOTE — ED Notes (Addendum)
Patient ambulated with assistance in the room. Pt tolerated it well with staff member.

## 2016-07-05 NOTE — ED Notes (Signed)
Notified [facility] for transportation back to [facility].  
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
## 2016-07-07 ENCOUNTER — Emergency Department (HOSPITAL_COMMUNITY)
Admission: EM | Admit: 2016-07-07 | Discharge: 2016-07-07 | Disposition: A | Discharge status: Skilled Nursing Facility | Payer: Medicaid Other | Attending: Emergency Medicine | Admitting: Emergency Medicine

## 2016-07-07 ENCOUNTER — Emergency Department (HOSPITAL_COMMUNITY): Payer: Medicaid Other

## 2016-07-07 DIAGNOSIS — F17200 Nicotine dependence, unspecified, uncomplicated: Secondary | ICD-10-CM | POA: Diagnosis not present

## 2016-07-07 DIAGNOSIS — I10 Essential (primary) hypertension: Secondary | ICD-10-CM | POA: Insufficient documentation

## 2016-07-07 DIAGNOSIS — Z794 Long term (current) use of insulin: Secondary | ICD-10-CM | POA: Insufficient documentation

## 2016-07-07 DIAGNOSIS — Z7982 Long term (current) use of aspirin: Secondary | ICD-10-CM | POA: Diagnosis not present

## 2016-07-07 DIAGNOSIS — F0390 Unspecified dementia without behavioral disturbance: Secondary | ICD-10-CM | POA: Diagnosis present

## 2016-07-07 DIAGNOSIS — E119 Type 2 diabetes mellitus without complications: Secondary | ICD-10-CM | POA: Diagnosis not present

## 2016-07-07 DIAGNOSIS — W19XXXA Unspecified fall, initial encounter: Secondary | ICD-10-CM

## 2016-07-07 NOTE — Discharge Instructions (Signed)
We saw you in the ER after you had a fall. °All the imaging results are normal, no fractures seen. No evidence of brain bleed. °Please be very careful with walking, and do everything possible to prevent falls. ° ° °
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
## 2016-07-07 NOTE — ED Notes (Signed)
Patient transported to CT 

## 2016-07-07 NOTE — ED Notes (Signed)
Pt diaper changed

## 2016-07-08 ENCOUNTER — Encounter (HOSPITAL_COMMUNITY): Payer: Self-pay | Admitting: Emergency Medicine

## 2016-07-08 ENCOUNTER — Emergency Department (HOSPITAL_COMMUNITY): Payer: Medicaid Other

## 2016-07-08 ENCOUNTER — Observation Stay (HOSPITAL_COMMUNITY)
Admission: EM | Admit: 2016-07-08 | Discharge: 2016-07-09 | Disposition: A | Discharge status: Home / Self Care | Payer: Medicaid Other | Attending: Nephrology | Admitting: Nephrology

## 2016-07-08 DIAGNOSIS — Z794 Long term (current) use of insulin: Secondary | ICD-10-CM | POA: Insufficient documentation

## 2016-07-08 DIAGNOSIS — N390 Urinary tract infection, site not specified: Secondary | ICD-10-CM | POA: Diagnosis present

## 2016-07-08 DIAGNOSIS — R531 Weakness: Secondary | ICD-10-CM | POA: Diagnosis not present

## 2016-07-08 DIAGNOSIS — Z888 Allergy status to other drugs, medicaments and biological substances status: Secondary | ICD-10-CM | POA: Insufficient documentation

## 2016-07-08 DIAGNOSIS — Z8673 Personal history of transient ischemic attack (TIA), and cerebral infarction without residual deficits: Secondary | ICD-10-CM

## 2016-07-08 DIAGNOSIS — W19XXXA Unspecified fall, initial encounter: Secondary | ICD-10-CM | POA: Diagnosis not present

## 2016-07-08 DIAGNOSIS — L84 Corns and callosities: Secondary | ICD-10-CM | POA: Insufficient documentation

## 2016-07-08 DIAGNOSIS — D649 Anemia, unspecified: Secondary | ICD-10-CM | POA: Insufficient documentation

## 2016-07-08 DIAGNOSIS — R55 Syncope and collapse: Secondary | ICD-10-CM | POA: Diagnosis present

## 2016-07-08 DIAGNOSIS — R296 Repeated falls: Secondary | ICD-10-CM | POA: Diagnosis present

## 2016-07-08 DIAGNOSIS — Z79899 Other long term (current) drug therapy: Secondary | ICD-10-CM | POA: Diagnosis not present

## 2016-07-08 DIAGNOSIS — G9340 Encephalopathy, unspecified: Principal | ICD-10-CM | POA: Insufficient documentation

## 2016-07-08 DIAGNOSIS — E785 Hyperlipidemia, unspecified: Secondary | ICD-10-CM | POA: Diagnosis not present

## 2016-07-08 DIAGNOSIS — Z87891 Personal history of nicotine dependence: Secondary | ICD-10-CM | POA: Diagnosis not present

## 2016-07-08 DIAGNOSIS — F0151 Vascular dementia with behavioral disturbance: Secondary | ICD-10-CM | POA: Diagnosis not present

## 2016-07-08 DIAGNOSIS — B9620 Unspecified Escherichia coli [E. coli] as the cause of diseases classified elsewhere: Secondary | ICD-10-CM | POA: Diagnosis not present

## 2016-07-08 DIAGNOSIS — Z7982 Long term (current) use of aspirin: Secondary | ICD-10-CM | POA: Insufficient documentation

## 2016-07-08 DIAGNOSIS — R410 Disorientation, unspecified: Secondary | ICD-10-CM | POA: Diagnosis present

## 2016-07-08 DIAGNOSIS — E119 Type 2 diabetes mellitus without complications: Secondary | ICD-10-CM | POA: Diagnosis not present

## 2016-07-08 DIAGNOSIS — M47812 Spondylosis without myelopathy or radiculopathy, cervical region: Secondary | ICD-10-CM | POA: Diagnosis not present

## 2016-07-08 DIAGNOSIS — I10 Essential (primary) hypertension: Secondary | ICD-10-CM | POA: Diagnosis not present

## 2016-07-08 DIAGNOSIS — E876 Hypokalemia: Secondary | ICD-10-CM | POA: Insufficient documentation

## 2016-07-08 DIAGNOSIS — F03918 Unspecified dementia, unspecified severity, with other behavioral disturbance: Secondary | ICD-10-CM | POA: Diagnosis present

## 2016-07-08 DIAGNOSIS — F0391 Unspecified dementia with behavioral disturbance: Secondary | ICD-10-CM | POA: Diagnosis not present

## 2016-07-08 LAB — BASIC METABOLIC PANEL
ANION GAP: 9 (ref 5–15)
BUN: 49 mg/dL — ABNORMAL HIGH (ref 6–20)
CO2: 26 mmol/L (ref 22–32)
Calcium: 9 mg/dL (ref 8.9–10.3)
Chloride: 110 mmol/L (ref 101–111)
Creatinine, Ser: 1.77 mg/dL — ABNORMAL HIGH (ref 0.61–1.24)
GFR calc Af Amer: 45 mL/min — ABNORMAL LOW (ref >60)
GFR calc non Af Amer: 39 mL/min — ABNORMAL LOW (ref >60)
GLUCOSE: 157 mg/dL — AB (ref 65–99)
POTASSIUM: 4.5 mmol/L (ref 3.5–5.1)
SODIUM: 145 mmol/L (ref 135–145)

## 2016-07-08 LAB — CBC WITH DIFFERENTIAL/PLATELET
BASOS ABS: 0 10*3/uL (ref 0.0–0.1)
Basophils Relative: 0 %
EOS PCT: 0 %
Eosinophils Absolute: 0.1 10*3/uL (ref 0.0–0.7)
HCT: 24.9 % — ABNORMAL LOW (ref 39.0–52.0)
HEMOGLOBIN: 8.3 g/dL — AB (ref 13.0–17.0)
LYMPHS PCT: 13 %
Lymphs Abs: 1.7 10*3/uL (ref 0.7–4.0)
MCH: 31.4 pg (ref 26.0–34.0)
MCHC: 33.3 g/dL (ref 30.0–36.0)
MCV: 94.3 fL (ref 78.0–100.0)
Monocytes Absolute: 1.1 10*3/uL — ABNORMAL HIGH (ref 0.1–1.0)
Monocytes Relative: 9 %
NEUTROS ABS: 10.2 10*3/uL — AB (ref 1.7–7.7)
NEUTROS PCT: 78 %
PLATELETS: 221 10*3/uL (ref 150–400)
RBC: 2.64 MIL/uL — AB (ref 4.22–5.81)
RDW: 13.9 % (ref 11.5–15.5)
WBC: 13.1 10*3/uL — AB (ref 4.0–10.5)

## 2016-07-08 LAB — GLUCOSE, CAPILLARY
GLUCOSE-CAPILLARY: 168 mg/dL — AB (ref 65–99)
Glucose-Capillary: 114 mg/dL — ABNORMAL HIGH (ref 65–99)

## 2016-07-08 LAB — URINALYSIS, ROUTINE W REFLEX MICROSCOPIC
Bilirubin Urine: NEGATIVE
GLUCOSE, UA: NEGATIVE mg/dL
Ketones, ur: NEGATIVE mg/dL
Nitrite: NEGATIVE
Protein, ur: 30 mg/dL — AB
SPECIFIC GRAVITY, URINE: 1.015 (ref 1.005–1.030)
pH: 5 (ref 5.0–8.0)

## 2016-07-08 LAB — TSH: TSH: 2.184 u[IU]/mL (ref 0.350–4.500)

## 2016-07-08 LAB — MRSA PCR SCREENING: MRSA BY PCR: NEGATIVE

## 2016-07-08 LAB — POC OCCULT BLOOD, ED: Fecal Occult Bld: NEGATIVE

## 2016-07-08 LAB — PROTIME-INR
INR: 0.98
Prothrombin Time: 12.9 seconds (ref 11.4–15.2)

## 2016-07-08 MED ORDER — ONDANSETRON HCL 4 MG PO TABS: 4.0000 mg | ORAL_TABLET | Freq: Four times a day (QID) | ORAL | Status: DC | PRN

## 2016-07-08 MED ORDER — INSULIN ASPART 100 UNIT/ML ~~LOC~~ SOLN
0.0000 [IU] | Freq: Three times a day (TID) | SUBCUTANEOUS | Status: DC
  Administered 2016-07-09: 1 [IU] via SUBCUTANEOUS
  Administered 2016-07-09: 2 [IU] via SUBCUTANEOUS

## 2016-07-08 MED ORDER — HYDRALAZINE HCL 25 MG PO TABS
25.0000 mg | ORAL_TABLET | Freq: Three times a day (TID) | ORAL | Status: DC
Start: 2016-07-08 — End: 2016-07-09
  Administered 2016-07-08 – 2016-07-09 (×3): 25 mg via ORAL
  Filled 2016-07-08 (×3): qty 1

## 2016-07-08 MED ORDER — ASPIRIN 81 MG PO CHEW
81.0000 mg | CHEWABLE_TABLET | Freq: Every day | ORAL | Status: DC
  Administered 2016-07-09: 81 mg via ORAL
  Filled 2016-07-08: qty 1

## 2016-07-08 MED ORDER — ACETAMINOPHEN 650 MG RE SUPP: 650.0000 mg | Freq: Four times a day (QID) | RECTAL | Status: DC | PRN

## 2016-07-08 MED ORDER — DONEPEZIL HCL 5 MG PO TABS
5.0000 mg | ORAL_TABLET | Freq: Every day | ORAL | Status: DC
  Administered 2016-07-08: 5 mg via ORAL
  Filled 2016-07-08: qty 1

## 2016-07-08 MED ORDER — DEXTROSE 5 % IV SOLN
1.0000 g | Freq: Once | INTRAVENOUS | Status: AC
  Administered 2016-07-08: 1 g via INTRAVENOUS
  Filled 2016-07-08: qty 10

## 2016-07-08 MED ORDER — LORAZEPAM 0.5 MG PO TABS
0.2500 mg | ORAL_TABLET | Freq: Three times a day (TID) | ORAL | Status: DC | PRN
  Administered 2016-07-09: 0.25 mg via ORAL
  Filled 2016-07-08: qty 1

## 2016-07-08 MED ORDER — QUETIAPINE FUMARATE 100 MG PO TABS
100.0000 mg | ORAL_TABLET | Freq: Two times a day (BID) | ORAL | Status: DC
  Administered 2016-07-08 – 2016-07-09 (×2): 100 mg via ORAL
  Filled 2016-07-08 (×2): qty 1

## 2016-07-08 MED ORDER — HEPARIN SODIUM (PORCINE) 5000 UNIT/ML IJ SOLN
5000.0000 [IU] | Freq: Three times a day (TID) | INTRAMUSCULAR | Status: DC
  Administered 2016-07-08 – 2016-07-09 (×2): 5000 [IU] via SUBCUTANEOUS
  Filled 2016-07-08 (×2): qty 1

## 2016-07-08 MED ORDER — HYDROCODONE-ACETAMINOPHEN 5-325 MG PO TABS: 1.0000 | ORAL_TABLET | ORAL | Status: DC | PRN

## 2016-07-08 MED ORDER — GABAPENTIN 100 MG PO CAPS
200.0000 mg | ORAL_CAPSULE | Freq: Two times a day (BID) | ORAL | Status: DC
  Administered 2016-07-08 – 2016-07-09 (×2): 200 mg via ORAL
  Filled 2016-07-08 (×2): qty 2

## 2016-07-08 MED ORDER — INSULIN ASPART 100 UNIT/ML ~~LOC~~ SOLN
4.0000 [IU] | Freq: Three times a day (TID) | SUBCUTANEOUS | Status: DC
  Administered 2016-07-08 – 2016-07-09 (×3): 4 [IU] via SUBCUTANEOUS

## 2016-07-08 MED ORDER — METFORMIN HCL 500 MG PO TABS
500.0000 mg | ORAL_TABLET | Freq: Two times a day (BID) | ORAL | Status: DC
  Administered 2016-07-08 – 2016-07-09 (×2): 500 mg via ORAL
  Filled 2016-07-08 (×2): qty 1

## 2016-07-08 MED ORDER — ACETAMINOPHEN 325 MG PO TABS: 650.0000 mg | ORAL_TABLET | Freq: Four times a day (QID) | ORAL | Status: DC | PRN

## 2016-07-08 MED ORDER — DIVALPROEX SODIUM 125 MG PO CSDR
125.0000 mg | DELAYED_RELEASE_CAPSULE | Freq: Two times a day (BID) | ORAL | Status: DC
Start: 2016-07-08 — End: 2016-07-09
  Administered 2016-07-08 – 2016-07-09 (×2): 125 mg via ORAL
  Filled 2016-07-08 (×2): qty 1

## 2016-07-08 MED ORDER — SODIUM CHLORIDE 0.9 % IV SOLN: INTRAVENOUS | Status: DC

## 2016-07-08 MED ORDER — ATORVASTATIN CALCIUM 40 MG PO TABS
80.0000 mg | ORAL_TABLET | Freq: Every day | ORAL | Status: DC
  Administered 2016-07-08: 80 mg via ORAL
  Filled 2016-07-08: qty 2

## 2016-07-08 MED ORDER — LOPERAMIDE HCL 2 MG PO CAPS: 2.0000 mg | ORAL_CAPSULE | ORAL | Status: DC | PRN

## 2016-07-08 MED ORDER — LORAZEPAM 1 MG PO TABS
1.0000 mg | ORAL_TABLET | Freq: Three times a day (TID) | ORAL | Status: DC
  Administered 2016-07-08 – 2016-07-09 (×2): 1 mg via ORAL
  Filled 2016-07-08 (×2): qty 1

## 2016-07-08 MED ORDER — SODIUM CHLORIDE 0.9 % IV SOLN
INTRAVENOUS | Status: DC
Start: 2016-07-08 — End: 2016-07-08
  Administered 2016-07-08: 10:00:00 via INTRAVENOUS

## 2016-07-08 MED ORDER — AMLODIPINE BESYLATE 10 MG PO TABS
10.0000 mg | ORAL_TABLET | Freq: Every day | ORAL | Status: DC
  Administered 2016-07-09: 10 mg via ORAL
  Filled 2016-07-08: qty 1

## 2016-07-08 MED ORDER — BACLOFEN 10 MG PO TABS
10.0000 mg | ORAL_TABLET | Freq: Every day | ORAL | Status: DC
  Administered 2016-07-08: 10 mg via ORAL
  Filled 2016-07-08: qty 1

## 2016-07-08 MED ORDER — ONDANSETRON HCL 4 MG/2ML IJ SOLN: 4.0000 mg | Freq: Four times a day (QID) | INTRAMUSCULAR | Status: DC | PRN

## 2016-07-08 MED ORDER — DEXTROSE 5 % IV SOLN
1.0000 g | Freq: Every day | INTRAVENOUS | Status: DC
Start: 2016-07-09 — End: 2016-07-09
  Filled 2016-07-08: qty 10

## 2016-07-08 MED ORDER — INSULIN DETEMIR 100 UNIT/ML ~~LOC~~ SOLN
5.0000 [IU] | Freq: Every day | SUBCUTANEOUS | Status: DC
  Administered 2016-07-08: 5 [IU] via SUBCUTANEOUS
  Filled 2016-07-08 (×2): qty 0.05

## 2016-07-08 NOTE — ED Notes (Signed)
Patient is resting comfortably. Normal respiratory pattern. Pt arouse to name.

## 2016-07-08 NOTE — H&P (Signed)
History and Physical    Manuel Higgins RUE:[id number] DOB: 02/28/1950 DOA: 07/08/2016  PCP: Mortimer Fries, PA  Patient coming from: [facility]  Chief Complaint: Marletta Lor  HPI: Manuel Higgins is a 66 y.o. male with medical history significant of CVA, vascular dementia, he is a nursing home resident, and being in the hospital 7 times in the past 6 months of which she was admitted twice. This time he came in because of generalized weakness and a fall. He is able to provide his name and date of birth other than that no much of history. Most of history obtained from EDP notes. Initial evaluation with MRI showed no acute events, UA is consistent with UTI.   ED Course:  Vitals: WNL Labs: Creatinine is 1.7, WBC 13.1 hemoglobin 8.3. Imaging: MRI no acute findings Interventions: Started on Rocephin  Review of Systems:  Unable to obtain review of systems secondary to confusion  Past Medical History:  Diagnosis Date  . Anemia   . Dementia    unspecified w/o behavioral disturbance  . Diabetes mellitus without complication (HCC)   . Dysphagia   . Hyperlipidemia   . Hypertension   . Hypertensive crisis 02/15/2013  . Stroke [facility]) 03/2013   Multiple infarcts  . Toe ulcer (HCC)     Past Surgical History:  Procedure Laterality Date  . BACK SURGERY    . LOOP RECORDER IMPLANT  04/08/2013   MDT LinQ implanted by Dr Ladona Ridgel for cryptogenic stroke  . LOOP RECORDER IMPLANT N/A 04/08/2013   Procedure: LOOP RECORDER IMPLANT;  Surgeon: Marinus Maw, MD;  Location: [facility] CATH LAB;  Service: Cardiovascular;  Laterality: N/A;  . TEE WITHOUT CARDIOVERSION N/A 04/07/2013   Procedure: TRANSESOPHAGEAL ECHOCARDIOGRAM (TEE);  Surgeon: Lars Masson, MD;  Location: [facility] ENDOSCOPY;  Service: Cardiovascular;  Laterality: N/A;  . TONSILLECTOMY       reports that he quit smoking about 12 years ago. His smoking use included Cigarettes. He has a 3.30 pack-year smoking history. He has never used smokeless tobacco. He  reports that he does not drink alcohol or use drugs.  Allergies  Allergen Reactions  . Risperdal [Risperidone] Other (See Comments)    Reaction:  Unknown     Family History  Problem Relation Age of Onset  . Family history unknown: Yes    Prior to Admission medications   Medication Sig Start Date End Date Taking? Authorizing Provider  acetaminophen (TYLENOL) 500 MG tablet Take 500 mg by mouth every 4 (four) hours as needed for mild pain, moderate pain, fever or headache.   Yes [provider]  alum & mag hydroxide-simeth (ALMACONE) 200-200-20 MG/5ML suspension Take 30 mLs by mouth every 6 (six) hours as needed for indigestion or heartburn.    Yes [provider]  amLODipine (NORVASC) 10 MG tablet Take 1 tablet (10 mg total) by mouth daily. 03/31/16  Yes Sheikh, Omair Latif, DO  aspirin 81 MG chewable tablet Chew 1 tablet (81 mg total) by mouth daily. 05/10/15  Yes Selina Cooley, MD  atorvastatin (LIPITOR) 80 MG tablet Take 1 tablet (80 mg total) by mouth daily at 6 PM. 05/10/15  Yes Selina Cooley, MD  baclofen (LIORESAL) 10 MG tablet Take 1 tablet (10 mg total) by mouth at bedtime. 05/10/15  Yes Selina Cooley, MD  Cholecalciferol (VITAMIN D) 2000 units CAPS Take 2,000 Units by mouth daily.    Yes [provider]  divalproex (DEPAKOTE SPRINKLE) 125 MG capsule Take 2 capsules (250 mg total) by mouth 2 (  two) times daily. Patient taking differently: Take 125 mg by mouth 2 (two) times daily.  06/09/16  Yes Charm RingsLord, Jamison Y, NP  donepezil (ARICEPT) 5 MG tablet Take 5 mg by mouth at bedtime.   Yes [provider]  FLUoxetine (PROZAC) 40 MG capsule Take 40 mg by mouth daily.    Yes [provider]  gabapentin (NEURONTIN) 100 MG capsule Take 200 mg by mouth 2 (two) times daily.   Yes [provider]  guaifenesin (ROBITUSSIN) 100 MG/5ML syrup Take 200 mg by mouth every 6 (six) hours as needed for cough.   Yes [provider]  hydrALAZINE  (APRESOLINE) 25 MG tablet Take 1 tablet (25 mg total) by mouth every 8 (eight) hours. 03/31/16  Yes Sheikh, Omair Latif, DO  insulin aspart (NOVOLOG FLEXPEN) 100 UNIT/ML FlexPen Inject 2-12 Units into the skin 3 (three) times daily with meals. Pt uses per sliding scale:  150-200:  2 units, 201-250:  4 units, 251-300:  6 units, 301-350:  8 units, 351-400:  10 units, > 401:  12 units and call MD.   Yes [provider]  Insulin Detemir (LEVEMIR FLEXTOUCH) 100 UNIT/ML Pen Inject 5 Units into the skin at bedtime.   Yes [provider]  loperamide (IMODIUM) 2 MG capsule Take 2 mg by mouth every 3 (three) hours as needed for diarrhea or loose stools.   Yes [provider]  LORazepam (ATIVAN) 1 MG tablet Take 1 mg by mouth 3 (three) times daily.   Yes [provider]  magnesium hydroxide (MILK OF MAGNESIA) 400 MG/5ML suspension Take 30 mLs by mouth at bedtime as needed for mild constipation.   Yes [provider]  metFORMIN (GLUCOPHAGE) 500 MG tablet Take 500 mg by mouth 2 (two) times daily with a meal.   Yes [provider]  neomycin-bacitracin-polymyxin (NEOSPORIN) ointment Apply 1 application topically 4 (four) times daily as needed (minor skin tears/abrasions).    Yes [provider]  QUEtiapine (SEROQUEL) 100 MG tablet Take 100 mg by mouth 2 (two) times daily.   Yes [provider]  LORazepam (ATIVAN) 0.5 MG tablet Take 0.25 mg by mouth every 8 (eight) hours as needed for anxiety.    [provider]  QUEtiapine (SEROQUEL) 50 MG tablet Take 50 mg by mouth 2 (two) times daily.    [provider]    Physical Exam:  Vitals:   07/08/16 0916 07/08/16 0929 07/08/16 1143 07/08/16 1349  BP: 123/69  (!) 165/72 (!) 167/78  Pulse: 81  75 76  Resp: 18  12 14   Temp: (!) 96.8 F (36 C)     TempSrc: Axillary     SpO2: 97%  95% 99%  Weight:  74.8 kg (165 lb)    Height:  5\' 9"  (1.753 m)      Constitutional: NAD, calm,  comfortable Eyes: PERRL, lids and conjunctivae normal ENMT: Mucous membranes are moist. Posterior pharynx clear of any exudate or lesions.Normal dentition.  Neck: normal, supple, no masses, no thyromegaly Respiratory: clear to auscultation bilaterally, no wheezing, no crackles. Normal respiratory effort. No accessory muscle use.  Cardiovascular: Regular rate and rhythm, no murmurs / rubs / gallops. No extremity edema. 2+ pedal pulses. No carotid bruits.  Abdomen: no tenderness, no masses palpated. No hepatosplenomegaly. Bowel sounds positive.  Musculoskeletal: no clubbing / cyanosis. No joint deformity upper and lower extremities. Good ROM, no contractures. Normal muscle tone.  Skin: no rashes, lesions, ulcers. No induration Neurologic: CN 2-12 grossly  intact. Sensation intact, DTR normal. Strength 5/5 in all 4.  Psychiatric: Normal judgment and insight. Alert and oriented x 3. Normal mood.   Labs on Admission: I have personally reviewed following labs and imaging studies  CBC:  Recent Labs Lab 07/03/16 1134 07/03/16 1453 07/08/16 1014  WBC 6.0 6.2 13.1*  NEUTROABS 3.8  --  10.2*  HGB 8.5* 9.2* 8.3*  HCT 25.8* 28.2* 24.9*  MCV 94.5 95.9 94.3  PLT 171 175 221   Basic Metabolic Panel:  Recent Labs Lab 07/03/16 1134 07/08/16 1014  NA 141 145  K 4.8 4.5  CL 109 110  CO2 26 26  GLUCOSE 98 157*  BUN 40* 49*  CREATININE 1.65* 1.77*  CALCIUM 8.8* 9.0   GFR: Estimated Creatinine Clearance: 42.2 mL/min (A) (by C-G formula based on SCr of 1.77 mg/dL (H)). Liver Function Tests:  Recent Labs Lab 07/03/16 1134  AST 19  ALT 12*  ALKPHOS 98  BILITOT 0.4  PROT 6.6  ALBUMIN 3.8   No results for input(s): LIPASE, AMYLASE in the last 168 hours. No results for input(s): AMMONIA in the last 168 hours. Coagulation Profile: No results for input(s): INR, PROTIME in the last 168 hours. Cardiac Enzymes: No results for input(s): CKTOTAL, CKMB, CKMBINDEX, TROPONINI in the last 168  hours. BNP (last 3 results) No results for input(s): PROBNP in the last 8760 hours. HbA1C: No results for input(s): HGBA1C in the last 72 hours. CBG: No results for input(s): GLUCAP in the last 168 hours. Lipid Profile: No results for input(s): CHOL, HDL, LDLCALC, TRIG, CHOLHDL, LDLDIRECT in the last 72 hours. Thyroid Function Tests: No results for input(s): TSH, T4TOTAL, FREET4, T3FREE, THYROIDAB in the last 72 hours. Anemia Panel: No results for input(s): VITAMINB12, FOLATE, FERRITIN, TIBC, IRON, RETICCTPCT in the last 72 hours. Urine analysis:    Component Value Date/Time   COLORURINE YELLOW 07/08/2016 0939   APPEARANCEUR CLOUDY (A) 07/08/2016 0939   LABSPEC 1.015 07/08/2016 0939   PHURINE 5.0 07/08/2016 0939   GLUCOSEU NEGATIVE 07/08/2016 0939   HGBUR MODERATE (A) 07/08/2016 0939   BILIRUBINUR NEGATIVE 07/08/2016 0939   KETONESUR NEGATIVE 07/08/2016 0939   PROTEINUR 30 (A) 07/08/2016 0939   UROBILINOGEN 0.2 04/18/2013 2134   NITRITE NEGATIVE 07/08/2016 0939   LEUKOCYTESUR LARGE (A) 07/08/2016 0939   Sepsis Labs: !!!!!!!!!!!!!!!!!!!!!!!!!!!!!!!!!!!!!!!!!!!! Invalid input(s): PROCALCITONIN, LACTICIDVEN Recent Results (from the past 240 hour(s))  Urine Culture     Status: None   Collection Time: 07/03/16 11:41 AM  Result Value Ref Range Status   Specimen Description URINE, CATHETERIZED  Final   Special Requests NONE  Final   Culture   Final    NO GROWTH Performed at [facility], [address] [place], [place] [address]    Report Status 07/04/2016 FINAL  Final     Radiological Exams on Admission: Dg Chest 2 View  Result Date: 07/08/2016 CLINICAL DATA:  Nursing home Patient had an unwitnessed fall this am. Staff states patient was found on the floor. Pt nonverbally communicative, unable to give hx or assist w/imaging. Pt in improvised cervical immobilizer made from rolled towel and tape. EXAM: CHEST  2 VIEW COMPARISON:  06/07/2016 FINDINGS: Normal mediastinum  and cardiac silhouette. Normal pulmonary vasculature. No evidence of effusion, infiltrate, or pneumothorax. No acute bony abnormality. Cardiac recording device noted. IMPRESSION: No acute cardiopulmonary process. Electronically Signed   By: Genevive Bi M.D.   On: 07/08/2016 10:19   Ct Head Wo Contrast  Result Date: 07/08/2016  CLINICAL DATA:  Fall. EXAM: CT HEAD WITHOUT CONTRAST CT CERVICAL SPINE WITHOUT CONTRAST TECHNIQUE: Multidetector CT imaging of the head and cervical spine was performed following the standard protocol without intravenous contrast. Multiplanar CT image reconstructions of the cervical spine were also generated. COMPARISON:  Head CT 07/05/2016 and 05/07/2015 FINDINGS: CT HEAD FINDINGS Brain: The ventricles, cisterns and other CSF spaces are within normal. There is chronic ischemic microvascular disease and minimal age related atrophic change. There is no mass, mass effect, shift of midline structures or acute hemorrhage. No evidence of acute infarction. Vascular: No hyperdense vessel or unexpected calcification. Skull: Normal. Negative for fracture or focal lesion. Sinuses/Orbits: No acute finding. Minimal chronic opacification over the right frontal ethmoidal recess. Other: Improved soft tissue swelling over the right frontal scalp. CT CERVICAL SPINE FINDINGS Alignment: Within normal. Skull base and vertebrae: Mild spondylosis throughout the cervical spine. Vertebral body heights are maintained. There is minimal uncovertebral joint spurring. Partial fusion of the facet joints from C2-C4 left worse than right. Atlantoaxial articulation is within normal. No evidence of acute fracture or subluxation. Soft tissues and spinal canal: No prevertebral fluid or swelling. No visible canal hematoma. Disc levels:  Within normal. Upper chest: Negative. Other: Moderate calcified plaque at the carotid bifurcations bilaterally. IMPRESSION: No acute intracranial findings. Chronic ischemic microvascular  disease and mild age related atrophy. Improved soft tissue swelling over the right frontal scalp. No acute cervical spine injury. Mild spondylosis of the cervical spine. Atherosclerotic plaque at the carotid bifurcations. Electronically Signed   By: Elberta Fortis M.D.   On: 07/08/2016 10:13   Ct Head Wo Contrast  Result Date: 07/07/2016 CLINICAL DATA:  Fall.  Found on floor. EXAM: CT HEAD WITHOUT CONTRAST TECHNIQUE: Contiguous axial images were obtained from the base of the skull through the vertex without intravenous contrast. COMPARISON:  Head CT 2 days prior 07/05/2016, additional prior exams reviewed FINDINGS: Brain: No intracranial hemorrhage. Stable atrophy and chronic small vessel ischemia. Unchanged multifocal small lacunar infarcts in bilateral cerebellum and basal ganglia. No evidence of acute ischemia. No subdural or extra-axial fluid collection. No mass effect or midline shift. Vascular: Atherosclerosis of skullbase vasculature without hyperdense vessel or abnormal calcification. Skull: No skull fracture. Sinuses/Orbits: Paranasal sinuses and mastoid air cells are clear. The visualized orbits are unremarkable. Other: Right frontal scalp hematoma has slightly decreased in size from prior exam. IMPRESSION: 1.  No acute intracranial abnormality.  No skull fracture. 2. Stable atrophy, chronic and remote ischemia. Electronically Signed   By: Rubye Oaks M.D.   On: 07/07/2016 05:28   Ct Cervical Spine Wo Contrast  Result Date: 07/08/2016 CLINICAL DATA:  Fall. EXAM: CT HEAD WITHOUT CONTRAST CT CERVICAL SPINE WITHOUT CONTRAST TECHNIQUE: Multidetector CT imaging of the head and cervical spine was performed following the standard protocol without intravenous contrast. Multiplanar CT image reconstructions of the cervical spine were also generated. COMPARISON:  Head CT 07/05/2016 and 05/07/2015 FINDINGS: CT HEAD FINDINGS Brain: The ventricles, cisterns and other CSF spaces are within normal. There is  chronic ischemic microvascular disease and minimal age related atrophic change. There is no mass, mass effect, shift of midline structures or acute hemorrhage. No evidence of acute infarction. Vascular: No hyperdense vessel or unexpected calcification. Skull: Normal. Negative for fracture or focal lesion. Sinuses/Orbits: No acute finding. Minimal chronic opacification over the right frontal ethmoidal recess. Other: Improved soft tissue swelling over the right frontal scalp. CT CERVICAL SPINE FINDINGS Alignment: Within normal. Skull base and vertebrae: Mild spondylosis throughout the  cervical spine. Vertebral body heights are maintained. There is minimal uncovertebral joint spurring. Partial fusion of the facet joints from C2-C4 left worse than right. Atlantoaxial articulation is within normal. No evidence of acute fracture or subluxation. Soft tissues and spinal canal: No prevertebral fluid or swelling. No visible canal hematoma. Disc levels:  Within normal. Upper chest: Negative. Other: Moderate calcified plaque at the carotid bifurcations bilaterally. IMPRESSION: No acute intracranial findings. Chronic ischemic microvascular disease and mild age related atrophy. Improved soft tissue swelling over the right frontal scalp. No acute cervical spine injury. Mild spondylosis of the cervical spine. Atherosclerotic plaque at the carotid bifurcations. Electronically Signed   By: Elberta Fortis M.D.   On: 07/08/2016 10:13   Mr Brain Wo Contrast  Result Date: 07/08/2016 CLINICAL DATA:  Recurrent falls. Altered mental status. History of stroke. EXAM: MRI HEAD WITHOUT CONTRAST TECHNIQUE: Multiplanar, multiecho pulse sequences of the brain and surrounding structures were obtained without intravenous contrast. COMPARISON:  Head CT 07/08/2016 and MRI 05/07/2015 FINDINGS: Brain: There is no evidence of acute infarct, acute intracranial hemorrhage, mass, midline shift, or extra-axial fluid collection. Patchy to confluent T2  hyperintensities in the cerebral white matter and cerebellum are unchanged from the prior MRI and compatible with advanced chronic small vessel ischemic disease. Numerous small chronic infarcts are again seen throughout the cerebellum bilaterally as well as in the deep cerebral white matter and deep gray nuclei, also unchanged. There is moderate cerebral atrophy. Chronic small vessel ischemic changes and prominent atrophy are noted in the brainstem. A few chronic scattered microhemorrhages are again noted. Vascular: Major intracranial vascular flow voids are preserved. Skull and upper cervical spine: Unremarkable bone marrow signal. Sinuses/Orbits: Unremarkable orbits. Minimal paranasal sinus mucosal thickening and small right and trace left mastoid effusions. Other: None. IMPRESSION: 1. No acute intracranial abnormality. 2. Advanced chronic small vessel ischemic changes as above. Electronically Signed   By: Sebastian Ache M.D.   On: 07/08/2016 13:53    EKG: Independently reviewed.   Assessment/Plan Principal Problem:   Acute encephalopathy Active Problems:   Essential hypertension   History of embolic stroke   Diabetes mellitus type 2 in nonobese (HCC)   Dementia with behavioral disturbance   Confusion   UTI (urinary tract infection)    Acute metabolic encephalopathy -Patient has dementia likely secondary to stroke but came in with more confusion. -He is awake and alert to self, answer his name and her birth, not oriented to place or time. -This is likely secondary to UTI.  UTI -Urinalysis consistent with UTI, patient started on Rocephin.  -Has leukocytosis, difficult to get any history to find if he has LUT symptoms. -Urine culture obtained we'll adjust antibiotics according to culture results.  Fall -This is likely secondary to generalized weakness from UTI, MRI was done in the ED and showed no acute events. -No further workup, PT to evaluate.  Diabetes mellitus type 2 -Blood sugar  appears to be controlled, he is on Levemir insulin and NovoLog both restarted. -Carb modified diet and SSI.  Dementia with behavioral disturbances -Patient had previous ER visits for disruptive behavior and agitation. -He is on Seroquel, Depakote and Ativan all continued. -As needed Haldol, if develops sundowning and agitation.  Essential hypertension -Restart home medications.   DVT prophylaxis: SQ Heparin Code Status: Full code Family Communication: Plan D/W patient Disposition Plan: SNF Consults called:  Admission status: Inpatient, MedSurg   [facility] A MD [facility] Pager [phone]  If 7PM-7AM, please contact night-coverage [email or web address] Password [email or web address]  07/08/2016, 2:30 PM

## 2016-07-08 NOTE — ED Notes (Signed)
Patient transported to CT 

## 2016-07-08 NOTE — ED Notes (Signed)
Patient transported to MRI 

## 2016-07-08 NOTE — Progress Notes (Signed)
On admission patient has fully healed pressure injury on sacrum.

## 2016-07-08 NOTE — ED Notes (Signed)
Patient is resting comfortably. 

## 2016-07-08 NOTE — ED Triage Notes (Signed)
Pt BIB EMS from [facility].  Patient had an unwitnessed fall.  Staff states patient was found on the floor.  Patient at baseline per staff and EMS.  EMS states staff said patient has been declining over the past week.     BP:  130/62 HR: 80 R: 14 98% on room air  CBG: 183

## 2016-07-08 NOTE — ED Provider Notes (Signed)
[facility]-EMERGENCY DEPT Provider Note   CSN: [id number] Arrival date & time: 07/07/16  0145     History   Chief Complaint Chief Complaint  Patient presents with  . Fall    HPI Manuel Higgins is a 66 y.o. male.  HPI  Pt comes from the SNF after an unwitnessed fall. LEVEL 5 CAVEAT FOR DEVELOPMENTAL DELAY.  Pt has no complains of pain.  Past Medical History:  Diagnosis Date  . Anemia   . Dementia    unspecified w/o behavioral disturbance  . Diabetes mellitus without complication (HCC)   . Dysphagia   . Hyperlipidemia   . Hypertension   . Hypertensive crisis 02/15/2013  . Stroke [facility](HCC) 03/2013   Multiple infarcts  . Toe ulcer [facility](HCC)     Patient Active Problem List   Diagnosis Date Noted  . Dementia with behavioral disturbance 06/08/2016  . Diabetes mellitus type 2 in nonobese (HCC) 03/28/2016  . Fall 03/28/2016  . ARF (acute renal failure) (HCC) 03/27/2016  . 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 Hypokalemia 02/14/2016  . Leukocytosis 02/14/2016  . Altered mental status 05/07/2015  . History of embolic stroke 05/07/2015  . History of CVA with residual deficit 05/07/2015  . Acute encephalopathy 05/07/2015  . Essential hypertension 04/24/2014  . HLD (hyperlipidemia) 04/24/2014  . Dysphagia 04/04/2013  . Callus of foot 02/16/2013    Past Surgical History:  Procedure Laterality Date  . BACK SURGERY    . LOOP RECORDER IMPLANT  04/08/2013   MDT LinQ implanted by Dr Ladona Ridgelaylor for cryptogenic stroke  . LOOP RECORDER IMPLANT N/A 04/08/2013   Procedure: LOOP RECORDER IMPLANT;  Surgeon: Marinus MawGregg W Taylor, MD;  [facility];  Service: Cardiovascular;  Laterality: N/A;  . TEE WITHOUT CARDIOVERSION N/A 04/07/2013   Procedure: TRANSESOPHAGEAL ECHOCARDIOGRAM (TEE);  Surgeon: Lars MassonKatarina H Nelson, MD;  [facility];  Service: Cardiovascular;  Laterality: N/A;  . TONSILLECTOMY         Home Medications    Prior to Admission  medications   Medication Sig Start Date End Date Taking? Authorizing Provider  acetaminophen (TYLENOL) 500 MG tablet Take 500 mg by mouth every 4 (four) hours as needed for mild pain, moderate pain, fever or headache.    [provider]  alum & mag hydroxide-simeth (ALMACONE) 200-200-20 MG/5ML suspension Take 30 mLs by mouth every 6 (six) hours as needed for indigestion or heartburn.     [provider]  amLODipine (NORVASC) 10 MG tablet Take 1 tablet (10 mg total) by mouth daily. 03/31/16   Marguerita MerlesSheikh, Omair Latif, DO  aspirin 81 MG chewable tablet Chew 1 tablet (81 mg total) by mouth daily. 05/10/15   Selina CooleyFlores, Kyle, MD  atorvastatin (LIPITOR) 80 MG tablet Take 1 tablet (80 mg total) by mouth daily at 6 PM. 05/10/15   Selina CooleyFlores, Kyle, MD  baclofen (LIORESAL) 10 MG tablet Take 1 tablet (10 mg total) by mouth at bedtime. 05/10/15   Selina CooleyFlores, Kyle, MD  Cholecalciferol (VITAMIN D) 2000 units CAPS Take 2,000 Units by mouth daily.     [provider]  divalproex (DEPAKOTE SPRINKLE) 125 MG capsule Take 2 capsules (250 mg total) by mouth 2 (two) times daily. Patient taking differently: Take 125 mg by mouth 2 (two) times daily.  06/09/16   Charm RingsLord, Jamison Y, NP  donepezil (ARICEPT) 5 MG tablet Take 5 mg by mouth at bedtime.    [provider]  FLUoxetine (PROZAC) 40 MG capsule Take 40  mg by mouth daily.     [provider]  gabapentin (NEURONTIN) 100 MG capsule Take 200 mg by mouth 2 (two) times daily.    [provider]  guaifenesin (ROBITUSSIN) 100 MG/5ML syrup Take 200 mg by mouth every 6 (six) hours as needed for cough.    [provider]  hydrALAZINE (APRESOLINE) 25 MG tablet Take 1 tablet (25 mg total) by mouth every 8 (eight) hours. 03/31/16   Sheikh, Omair Latif, DO  insulin aspart (NOVOLOG FLEXPEN) 100 UNIT/ML FlexPen Inject 2-12 Units into the skin 3 (three) times daily with meals. Pt uses per sliding scale:  150-200:  2 units, 201-250:  4 units,  251-300:  6 units, 301-350:  8 units, 351-400:  10 units, > 401:  12 units and call MD.    [provider]  Insulin Detemir (LEVEMIR FLEXTOUCH) 100 UNIT/ML Pen Inject 5 Units into the skin at bedtime.    [provider]  loperamide (IMODIUM) 2 MG capsule Take 2 mg by mouth every 3 (three) hours as needed for diarrhea or loose stools.    [provider]  LORazepam (ATIVAN) 0.5 MG tablet Take 0.25 mg by mouth every 8 (eight) hours as needed for anxiety.    [provider]  LORazepam (ATIVAN) 1 MG tablet Take 1 mg by mouth 3 (three) times daily.    [provider]  magnesium hydroxide (MILK OF MAGNESIA) 400 MG/5ML suspension Take 30 mLs by mouth at bedtime as needed for mild constipation.    [provider]  metFORMIN (GLUCOPHAGE) 500 MG tablet Take 500 mg by mouth 2 (two) times daily with a meal.    [provider]  neomycin-bacitracin-polymyxin (NEOSPORIN) ointment Apply 1 application topically 4 (four) times daily as needed (minor skin tears/abrasions).     [provider]  QUEtiapine (SEROQUEL) 50 MG tablet Take 50 mg by mouth 2 (two) times daily.    [provider]    Family History Family History  Problem Relation Age of Onset  . Family history unknown: Yes    Social History Social History  Substance Use Topics  . Smoking status: Former Smoker    Packs/day: 0.33    Years: 10.00    Types: Cigarettes    Quit date: 01/17/2004  . Smokeless tobacco: Never Used  . Alcohol use No     Allergies   Risperdal [risperidone]   Review of Systems Review of Systems  Unable to perform ROS: Dementia     Physical Exam Updated Vital Signs BP (!) 162/73   Pulse 77   Temp 97.7 F (36.5 C) (Oral)   Resp 16   SpO2 99%   Physical Exam  Constitutional: He appears well-developed.  HENT:  Head: Atraumatic.  Neck: Neck supple.  Cardiovascular: Normal rate.   Pulmonary/Chest: Effort normal.  Musculoskeletal:    Head to toe evaluation shows no hematoma, bleeding of the scalp, no facial abrasions, no spine step offs, crepitus of the chest or neck, no tenderness to palpation of the bilateral upper and lower extremities, no gross deformities, no chest tenderness, no pelvic pain.   Neurological: He is alert.  Skin: Skin is warm.  Nursing note and vitals reviewed.    ED Treatments / Results  Labs (all labs ordered are listed, but only abnormal results are displayed) Labs Reviewed - No data to display  EKG  EKG Interpretation None       Radiology Ct Head Wo Contrast  Result Date: 07/07/2016 CLINICAL  DATA:  Fall.  Found on floor. EXAM: CT HEAD WITHOUT CONTRAST TECHNIQUE: Contiguous axial images were obtained from the base of the skull through the vertex without intravenous contrast. COMPARISON:  Head CT 2 days prior 07/05/2016, additional prior exams reviewed FINDINGS: Brain: No intracranial hemorrhage. Stable atrophy and chronic small vessel ischemia. Unchanged multifocal small lacunar infarcts in bilateral cerebellum and basal ganglia. No evidence of acute ischemia. No subdural or extra-axial fluid collection. No mass effect or midline shift. Vascular: Atherosclerosis of skullbase vasculature without hyperdense vessel or abnormal calcification. Skull: No skull fracture. Sinuses/Orbits: Paranasal sinuses and mastoid air cells are clear. The visualized orbits are unremarkable. Other: Right frontal scalp hematoma has slightly decreased in size from prior exam. IMPRESSION: 1.  No acute intracranial abnormality.  No skull fracture. 2. Stable atrophy, chronic and remote ischemia. Electronically Signed   By: Rubye Oaks M.D.   On: 07/07/2016 05:28    Procedures Procedures (including critical care time)  Medications Ordered in ED Medications - No data to display   Initial Impression / Assessment and Plan / ED Course  I have reviewed the triage vital signs and the nursing notes.  Pertinent labs  & imaging results that were available during my care of the patient were reviewed by me and considered in my medical decision making (see chart for details).     DDx includes: - Mechanical falls - ICH - Fractures - Contusions - Soft tissue injury  Exam not showing any focal concerns. Ct head ordered, if neg, we will d/c. Pt is moving all 4 extremity and has sensation in all 4 extremities.  Final Clinical Impressions(s) / ED Diagnoses   Final diagnoses:  Fall, initial encounter    New Prescriptions Discharge Medication List as of 07/07/2016  6:34 AM       Derwood Kaplan, MD 07/08/16 0105

## 2016-07-08 NOTE — ED Notes (Signed)
This Clinical research associatewriter placed condom cath on patient to obtain urine specimen.

## 2016-07-08 NOTE — ED Notes (Signed)
Bed: WA06 Expected date:  Expected time:  Means of arrival:  Comments: 66 yo fall
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
## 2016-07-09 ENCOUNTER — Observation Stay (HOSPITAL_BASED_OUTPATIENT_CLINIC_OR_DEPARTMENT_OTHER)
Admission: EM | Admit: 2016-07-09 | Discharge: 2016-07-10 | Disposition: A | Discharge status: Home / Self Care | Payer: Medicaid Other | Source: Home / Self Care | Attending: Emergency Medicine | Admitting: Emergency Medicine

## 2016-07-09 DIAGNOSIS — Z7982 Long term (current) use of aspirin: Secondary | ICD-10-CM | POA: Insufficient documentation

## 2016-07-09 DIAGNOSIS — I10 Essential (primary) hypertension: Secondary | ICD-10-CM

## 2016-07-09 DIAGNOSIS — Z87891 Personal history of nicotine dependence: Secondary | ICD-10-CM | POA: Insufficient documentation

## 2016-07-09 DIAGNOSIS — T383X5A Adverse effect of insulin and oral hypoglycemic [antidiabetic] drugs, initial encounter: Secondary | ICD-10-CM

## 2016-07-09 DIAGNOSIS — W19XXXA Unspecified fall, initial encounter: Secondary | ICD-10-CM

## 2016-07-09 DIAGNOSIS — R531 Weakness: Secondary | ICD-10-CM | POA: Insufficient documentation

## 2016-07-09 DIAGNOSIS — F0151 Vascular dementia with behavioral disturbance: Secondary | ICD-10-CM | POA: Diagnosis not present

## 2016-07-09 DIAGNOSIS — F0391 Unspecified dementia with behavioral disturbance: Secondary | ICD-10-CM

## 2016-07-09 DIAGNOSIS — R1310 Dysphagia, unspecified: Secondary | ICD-10-CM

## 2016-07-09 DIAGNOSIS — M47812 Spondylosis without myelopathy or radiculopathy, cervical region: Secondary | ICD-10-CM | POA: Insufficient documentation

## 2016-07-09 DIAGNOSIS — Z794 Long term (current) use of insulin: Secondary | ICD-10-CM | POA: Insufficient documentation

## 2016-07-09 DIAGNOSIS — Z8673 Personal history of transient ischemic attack (TIA), and cerebral infarction without residual deficits: Secondary | ICD-10-CM

## 2016-07-09 DIAGNOSIS — E11649 Type 2 diabetes mellitus with hypoglycemia without coma: Secondary | ICD-10-CM | POA: Insufficient documentation

## 2016-07-09 DIAGNOSIS — E119 Type 2 diabetes mellitus without complications: Secondary | ICD-10-CM

## 2016-07-09 DIAGNOSIS — E785 Hyperlipidemia, unspecified: Secondary | ICD-10-CM | POA: Insufficient documentation

## 2016-07-09 DIAGNOSIS — L84 Corns and callosities: Secondary | ICD-10-CM | POA: Insufficient documentation

## 2016-07-09 DIAGNOSIS — E876 Hypokalemia: Secondary | ICD-10-CM | POA: Insufficient documentation

## 2016-07-09 DIAGNOSIS — G9340 Encephalopathy, unspecified: Secondary | ICD-10-CM | POA: Diagnosis present

## 2016-07-09 DIAGNOSIS — E160 Drug-induced hypoglycemia without coma: Secondary | ICD-10-CM

## 2016-07-09 DIAGNOSIS — Z79899 Other long term (current) drug therapy: Secondary | ICD-10-CM | POA: Insufficient documentation

## 2016-07-09 DIAGNOSIS — Z888 Allergy status to other drugs, medicaments and biological substances status: Secondary | ICD-10-CM

## 2016-07-09 DIAGNOSIS — N390 Urinary tract infection, site not specified: Secondary | ICD-10-CM | POA: Diagnosis not present

## 2016-07-09 DIAGNOSIS — E162 Hypoglycemia, unspecified: Secondary | ICD-10-CM | POA: Diagnosis present

## 2016-07-09 LAB — BASIC METABOLIC PANEL
Anion gap: 7 (ref 5–15)
BUN: 33 mg/dL — AB (ref 6–20)
CALCIUM: 8.8 mg/dL — AB (ref 8.9–10.3)
CO2: 27 mmol/L (ref 22–32)
Chloride: 109 mmol/L (ref 101–111)
Creatinine, Ser: 1.26 mg/dL — ABNORMAL HIGH (ref 0.61–1.24)
GFR calc Af Amer: >60 mL/min (ref >60)
GFR, EST NON AFRICAN AMERICAN: 59 mL/min — AB (ref >60)
GLUCOSE: 122 mg/dL — AB (ref 65–99)
Potassium: 4.3 mmol/L (ref 3.5–5.1)
Sodium: 143 mmol/L (ref 135–145)

## 2016-07-09 LAB — CBC
HEMATOCRIT: 24.9 % — AB (ref 39.0–52.0)
Hemoglobin: 8.4 g/dL — ABNORMAL LOW (ref 13.0–17.0)
MCH: 32.1 pg (ref 26.0–34.0)
MCHC: 33.7 g/dL (ref 30.0–36.0)
MCV: 95 fL (ref 78.0–100.0)
Platelets: 253 10*3/uL (ref 150–400)
RBC: 2.62 MIL/uL — ABNORMAL LOW (ref 4.22–5.81)
RDW: 13.9 % (ref 11.5–15.5)
WBC: 9.5 10*3/uL (ref 4.0–10.5)

## 2016-07-09 LAB — HIV ANTIBODY (ROUTINE TESTING W REFLEX): HIV Screen 4th Generation wRfx: NONREACTIVE

## 2016-07-09 LAB — GLUCOSE, CAPILLARY
GLUCOSE-CAPILLARY: 165 mg/dL — AB (ref 65–99)
GLUCOSE-CAPILLARY: 137 mg/dL — AB (ref 65–99)
GLUCOSE-CAPILLARY: 129 mg/dL — AB (ref 65–99)

## 2016-07-09 LAB — CBG MONITORING, ED
Glucose-Capillary: 56 mg/dL — ABNORMAL LOW (ref 65–99)
Glucose-Capillary: 88 mg/dL (ref 65–99)

## 2016-07-09 MED ORDER — CEFPODOXIME PROXETIL 200 MG PO TABS: 200.0000 mg | ORAL_TABLET | Freq: Two times a day (BID) | ORAL | 0 refills | Status: DC

## 2016-07-09 MED ORDER — ALUM & MAG HYDROXIDE-SIMETH 200-200-20 MG/5ML PO SUSP: 30.0000 mL | Freq: Four times a day (QID) | ORAL | Status: DC | PRN

## 2016-07-09 MED ORDER — ONDANSETRON HCL 4 MG PO TABS: 4.0000 mg | ORAL_TABLET | Freq: Four times a day (QID) | ORAL | Status: DC | PRN

## 2016-07-09 MED ORDER — DONEPEZIL HCL 10 MG PO TABS
5.0000 mg | ORAL_TABLET | Freq: Every day | ORAL | Status: DC
  Administered 2016-07-09: 5 mg via ORAL
  Filled 2016-07-09: qty 1

## 2016-07-09 MED ORDER — HYDRALAZINE HCL 25 MG PO TABS
25.0000 mg | ORAL_TABLET | Freq: Three times a day (TID) | ORAL | Status: DC
  Administered 2016-07-09 – 2016-07-10 (×3): 25 mg via ORAL
  Filled 2016-07-09 (×4): qty 1

## 2016-07-09 MED ORDER — DIVALPROEX SODIUM 125 MG PO CSDR: 125.0000 mg | DELAYED_RELEASE_CAPSULE | Freq: Two times a day (BID) | ORAL | Status: AC

## 2016-07-09 MED ORDER — ONDANSETRON HCL 4 MG/2ML IJ SOLN: 4.0000 mg | Freq: Four times a day (QID) | INTRAMUSCULAR | Status: DC | PRN

## 2016-07-09 MED ORDER — VITAMIN D 1000 UNITS PO TABS
2000.0000 [IU] | ORAL_TABLET | Freq: Every day | ORAL | Status: DC
  Administered 2016-07-10: 2000 [IU] via ORAL
  Filled 2016-07-09: qty 2

## 2016-07-09 MED ORDER — INSULIN ASPART 100 UNIT/ML ~~LOC~~ SOLN
0.0000 [IU] | Freq: Three times a day (TID) | SUBCUTANEOUS | Status: DC
  Administered 2016-07-10: 5 [IU] via SUBCUTANEOUS

## 2016-07-09 MED ORDER — DIVALPROEX SODIUM 125 MG PO CSDR
125.0000 mg | DELAYED_RELEASE_CAPSULE | Freq: Two times a day (BID) | ORAL | Status: DC
  Administered 2016-07-09 – 2016-07-10 (×2): 125 mg via ORAL
  Filled 2016-07-09 (×3): qty 1

## 2016-07-09 MED ORDER — GABAPENTIN 100 MG PO CAPS
200.0000 mg | ORAL_CAPSULE | Freq: Two times a day (BID) | ORAL | Status: DC
  Administered 2016-07-09 – 2016-07-10 (×2): 200 mg via ORAL
  Filled 2016-07-09 (×2): qty 2

## 2016-07-09 MED ORDER — LOPERAMIDE HCL 2 MG PO CAPS: 2.0000 mg | ORAL_CAPSULE | ORAL | Status: DC | PRN

## 2016-07-09 MED ORDER — ENOXAPARIN SODIUM 40 MG/0.4ML ~~LOC~~ SOLN
40.0000 mg | SUBCUTANEOUS | Status: DC
  Administered 2016-07-09: 40 mg via SUBCUTANEOUS
  Filled 2016-07-09 (×2): qty 0.4

## 2016-07-09 MED ORDER — BACLOFEN 10 MG PO TABS
10.0000 mg | ORAL_TABLET | Freq: Every day | ORAL | Status: DC
  Administered 2016-07-09: 10 mg via ORAL
  Filled 2016-07-09: qty 1

## 2016-07-09 MED ORDER — ATORVASTATIN CALCIUM 40 MG PO TABS
80.0000 mg | ORAL_TABLET | Freq: Every day | ORAL | Status: DC
  Administered 2016-07-09: 80 mg via ORAL
  Filled 2016-07-09: qty 1
  Filled 2016-07-09: qty 2

## 2016-07-09 MED ORDER — QUETIAPINE FUMARATE 100 MG PO TABS
100.0000 mg | ORAL_TABLET | Freq: Two times a day (BID) | ORAL | Status: DC
  Administered 2016-07-09 – 2016-07-10 (×2): 100 mg via ORAL
  Filled 2016-07-09 (×2): qty 1

## 2016-07-09 MED ORDER — FLUOXETINE HCL 20 MG PO CAPS
40.0000 mg | ORAL_CAPSULE | Freq: Every day | ORAL | Status: DC
  Administered 2016-07-10: 40 mg via ORAL
  Filled 2016-07-09: qty 2

## 2016-07-09 MED ORDER — ACETAMINOPHEN 325 MG PO TABS: 650.0000 mg | ORAL_TABLET | Freq: Four times a day (QID) | ORAL | Status: DC | PRN

## 2016-07-09 MED ORDER — LORAZEPAM 1 MG PO TABS: 1.0000 mg | ORAL_TABLET | Freq: Four times a day (QID) | ORAL | Status: DC | PRN

## 2016-07-09 MED ORDER — ASPIRIN 81 MG PO CHEW
81.0000 mg | CHEWABLE_TABLET | Freq: Every day | ORAL | Status: DC
  Administered 2016-07-10: 81 mg via ORAL
  Filled 2016-07-09: qty 1

## 2016-07-09 MED ORDER — ACETAMINOPHEN 500 MG PO TABS: 500.0000 mg | ORAL_TABLET | ORAL | Status: DC | PRN

## 2016-07-09 MED ORDER — GUAIFENESIN 100 MG/5ML PO SYRP
200.0000 mg | ORAL_SOLUTION | Freq: Four times a day (QID) | ORAL | Status: DC | PRN
  Filled 2016-07-09: qty 10

## 2016-07-09 MED ORDER — POLYETHYLENE GLYCOL 3350 17 G PO PACK: 17.0000 g | PACK | Freq: Every day | ORAL | Status: DC | PRN

## 2016-07-09 MED ORDER — AMLODIPINE BESYLATE 10 MG PO TABS
10.0000 mg | ORAL_TABLET | Freq: Every day | ORAL | Status: DC
  Administered 2016-07-10: 10 mg via ORAL
  Filled 2016-07-09: qty 1

## 2016-07-09 MED ORDER — MAGNESIUM HYDROXIDE 400 MG/5ML PO SUSP
30.0000 mL | Freq: Every evening | ORAL | Status: DC | PRN
Start: 2016-07-09 — End: 2016-07-10

## 2016-07-09 MED ORDER — ACETAMINOPHEN 650 MG RE SUPP: 650.0000 mg | Freq: Four times a day (QID) | RECTAL | Status: DC | PRN

## 2016-07-09 MED ORDER — CEFPODOXIME PROXETIL 200 MG PO TABS
200.0000 mg | ORAL_TABLET | Freq: Two times a day (BID) | ORAL | Status: DC
  Administered 2016-07-09 – 2016-07-10 (×2): 200 mg via ORAL
  Filled 2016-07-09 (×2): qty 1

## 2016-07-09 NOTE — Progress Notes (Signed)
Physician Discharge Summary  Manuel Higgins RUE:[id number] DOB: September 10, 1950 DOA: 07/08/2016  PCP: Mortimer Fries, PA  Admit date: 07/08/2016 Discharge date: 07/09/2016  Admitted From:SNF Disposition:  SNF  Recommendations for Outpatient Follow-up:  1. Follow up with PCP in 1-2 weeks 2. Please obtain BMP/CBC in one week   Home Health:SNF Equipment/Devices:none Discharge Condition:stable CODE STATUS:full code Diet recommendation:carb modified heart healthy diet  Brief/Interim Summary: 66 year old male with history of prior stroke, vascular dementia, nursing home resident presented after a fall and generalized weakness. Initial evaluation including MRI showed no acute event. UA consistent with UTI. Patient was treated with IV fluid and IV antibiotics and admitted for further evaluation.  #Possible acute metabolic encephalopathy in the setting of UTI: Patient is alert awake and oriented to himself and June this morning. He finished his breakfast himself this morning. Mental status improved. MRI with no acute finding. He will be discharged to nursing home where he will get rehabilitation and therapy. Patient will benefit from outpatient PT OT therapy.  #UTI likely acute cystitis without hematuria: Treated with IV ceftriaxone and switching to oral cefpodoxime for 5 days on discharge. Clinically improved.  #Fall and generalized weakness likely due to physical deconditioning. MRI no acute finding. Continue rehabilitation and supportive care.  #Type 2 diabetes: Continue insulin regimen. Monitor blood sugar level and follow-up with PCP.  #Likely vascular dementia with history of behavioral disturbance: Patient was calm comfortable this morning. Continue home medications and supportive care.  #Essential hypertension: Continue home medicine. Blood pressure acceptable. Continue to monitor.  Likely acute kidney injury in the setting of dehydration and UTI. Serum creatinine level already improved.  Recommended to monitor lab in a week. Patient is clinically stable to transfer his care to outpatient.  Discharge Diagnoses:  Principal Problem:   Acute encephalopathy Active Problems:   Essential hypertension   History of embolic stroke   Diabetes mellitus type 2 in nonobese (HCC)   Dementia with behavioral disturbance   Confusion   UTI (urinary tract infection)    Discharge Instructions  Discharge Instructions    Call MD for:  difficulty breathing, headache or visual disturbances    Complete by:  As directed    Call MD for:  extreme fatigue    Complete by:  As directed    Call MD for:  hives    Complete by:  As directed    Call MD for:  persistant dizziness or light-headedness    Complete by:  As directed    Call MD for:  persistant nausea and vomiting    Complete by:  As directed    Call MD for:  severe uncontrolled pain    Complete by:  As directed    Call MD for:  temperature >100.4    Complete by:  As directed    Diet - low sodium heart healthy    Complete by:  As directed    Diet Carb Modified    Complete by:  As directed    Increase activity slowly    Complete by:  As directed      Allergies as of 07/09/2016      Reactions   Risperdal [risperidone] Other (See Comments)   Reaction:  Unknown       Medication List    TAKE these medications   acetaminophen 500 MG tablet Commonly known as:  TYLENOL Take 500 mg by mouth every 4 (four) hours as needed for mild pain, moderate pain, fever or headache.   ALMACONE 200-200-20 MG/5ML  suspension Generic drug:  alum & mag hydroxide-simeth Take 30 mLs by mouth every 6 (six) hours as needed for indigestion or heartburn.   amLODipine 10 MG tablet Commonly known as:  NORVASC Take 1 tablet (10 mg total) by mouth daily.   aspirin 81 MG chewable tablet Chew 1 tablet (81 mg total) by mouth daily.   atorvastatin 80 MG tablet Commonly known as:  LIPITOR Take 1 tablet (80 mg total) by mouth daily at 6 PM.   baclofen  10 MG tablet Commonly known as:  LIORESAL Take 1 tablet (10 mg total) by mouth at bedtime.   cefpodoxime 200 MG tablet Commonly known as:  VANTIN Take 1 tablet (200 mg total) by mouth 2 (two) times daily.   divalproex 125 MG capsule Commonly known as:  DEPAKOTE SPRINKLE Take 1 capsule (125 mg total) by mouth 2 (two) times daily.   donepezil 5 MG tablet Commonly known as:  ARICEPT Take 5 mg by mouth at bedtime.   FLUoxetine 40 MG capsule Commonly known as:  PROZAC Take 40 mg by mouth daily.   gabapentin 100 MG capsule Commonly known as:  NEURONTIN Take 200 mg by mouth 2 (two) times daily.   guaifenesin 100 MG/5ML syrup Commonly known as:  ROBITUSSIN Take 200 mg by mouth every 6 (six) hours as needed for cough.   hydrALAZINE 25 MG tablet Commonly known as:  APRESOLINE Take 1 tablet (25 mg total) by mouth every 8 (eight) hours.   LEVEMIR FLEXTOUCH 100 UNIT/ML Pen Generic drug:  Insulin Detemir Inject 5 Units into the skin at bedtime.   loperamide 2 MG capsule Commonly known as:  IMODIUM Take 2 mg by mouth every 3 (three) hours as needed for diarrhea or loose stools.   LORazepam 1 MG tablet Commonly known as:  ATIVAN Take 1 mg by mouth 3 (three) times daily.   LORazepam 0.5 MG tablet Commonly known as:  ATIVAN Take 0.25 mg by mouth every 8 (eight) hours as needed for anxiety.   magnesium hydroxide 400 MG/5ML suspension Commonly known as:  MILK OF MAGNESIA Take 30 mLs by mouth at bedtime as needed for mild constipation.   metFORMIN 500 MG tablet Commonly known as:  GLUCOPHAGE Take 500 mg by mouth 2 (two) times daily with a meal.   neomycin-bacitracin-polymyxin ointment Commonly known as:  NEOSPORIN Apply 1 application topically 4 (four) times daily as needed (minor skin tears/abrasions).   NOVOLOG FLEXPEN 100 UNIT/ML FlexPen Generic drug:  insulin aspart Inject 2-12 Units into the skin 3 (three) times daily with meals. Pt uses per sliding scale:  150-200:  2  units, 201-250:  4 units, 251-300:  6 units, 301-350:  8 units, 351-400:  10 units, > 401:  12 units and call MD.   QUEtiapine 100 MG tablet Commonly known as:  SEROQUEL Take 100 mg by mouth 2 (two) times daily. What changed:  Another medication with the same name was removed. Continue taking this medication, and follow the directions you see here.   Vitamin D 2000 units Caps Take 2,000 Units by mouth daily.      Follow-up Information    Mortimer Fries, [place]. Schedule an appointment as soon as possible for a visit in 1 week(s).   Specialty:  Internal Medicine Contact information: [address] [place] [address] [phone]          Allergies  Allergen Reactions  . Risperdal [Risperidone] Other (See Comments)    Reaction:  Unknown  Consultations: None  Procedures/Studies: None  Subjective: Seen and examined at bedside. He was alert awake and oriented to himself, June. He finished his breakfast this morning himself. Denied headache, dizziness, chest pain, shortness of breath. No urinary symptoms.  Discharge Exam: Vitals:   07/08/16 2238 07/09/16 0421  BP: (!) 151/67 130/65  Pulse: 78 73  Resp: 16 18  Temp: 97.7 F (36.5 C) 97.5 F (36.4 C)   Vitals:   07/08/16 1554 07/08/16 1724 07/08/16 2238 07/09/16 0421  BP: (!) 169/75 (!) 170/73 (!) 151/67 130/65  Pulse: 70 72 78 73  Resp: 16 14 16 18   Temp:  97.5 F (36.4 C) 97.7 F (36.5 C) 97.5 F (36.4 C)  TempSrc:  Oral Oral Axillary  SpO2: 99% 100% 99% 98%  Weight:      Height:        General: Pt is alert, awake, not in acute distress Cardiovascular: RRR, S1/S2 +, no rubs, no gallops Respiratory: CTA bilaterally, no wheezing, no rhonchi Abdominal: Soft, NT, ND, bowel sounds + Extremities: no edema, no cyanosis    The results of significant diagnostics from this hospitalization (including imaging, microbiology, ancillary and laboratory) are listed below for reference.      Microbiology: Recent Results (from the past 240 hour(s))  Urine Culture     Status: None   Collection Time: 07/03/16 11:41 AM  Result Value Ref Range Status   Specimen Description URINE, CATHETERIZED  Final   Special Requests NONE  Final   Culture   Final    NO GROWTH Performed at [facility], [address] [place], [place] [address]    Report Status 07/04/2016 FINAL  Final  MRSA PCR Screening     Status: None   Collection Time: 07/08/16  6:52 PM  Result Value Ref Range Status   MRSA by PCR NEGATIVE NEGATIVE Final    Comment:        The GeneXpert MRSA Assay (FDA approved for NASAL specimens only), is one component of a comprehensive MRSA colonization surveillance program. It is not intended to diagnose MRSA infection nor to guide or monitor treatment for MRSA infections.      Labs: BNP (last 3 results) No results for input(s): BNP in the last 8760 hours. Basic Metabolic Panel:  Recent Labs Lab 07/03/16 1134 07/08/16 1014 07/09/16 0526  NA 141 145 143  K 4.8 4.5 4.3  CL 109 110 109  CO2 26 26 27   GLUCOSE 98 157* 122*  BUN 40* 49* 33*  CREATININE 1.65* 1.77* 1.26*  CALCIUM 8.8* 9.0 8.8*   Liver Function Tests:  Recent Labs Lab 07/03/16 1134  AST 19  ALT 12*  ALKPHOS 98  BILITOT 0.4  PROT 6.6  ALBUMIN 3.8   No results for input(s): LIPASE, AMYLASE in the last 168 hours. No results for input(s): AMMONIA in the last 168 hours. CBC:  Recent Labs Lab 07/03/16 1134 07/03/16 1453 07/08/16 1014 07/09/16 0526  WBC 6.0 6.2 13.1* 9.5  NEUTROABS 3.8  --  10.2*  --   HGB 8.5* 9.2* 8.3* 8.4*  HCT 25.8* 28.2* 24.9* 24.9*  MCV 94.5 95.9 94.3 95.0  PLT 171 175 221 253   Cardiac Enzymes: No results for input(s): CKTOTAL, CKMB, CKMBINDEX, TROPONINI in the last 168 hours. BNP: Invalid input(s): POCBNP CBG:  Recent Labs Lab 07/08/16 1754 07/08/16 2110 07/09/16 0727  GLUCAP 114* 168* 165*   D-Dimer No results for input(s): DDIMER in  the last 72 hours. Hgb A1c  No results for input(s): HGBA1C in the last 72 hours. Lipid Profile No results for input(s): CHOL, HDL, LDLCALC, TRIG, CHOLHDL, LDLDIRECT in the last 72 hours. Thyroid function studies  Recent Labs  07/08/16 1704  TSH 2.184   Anemia work up No results for input(s): VITAMINB12, FOLATE, FERRITIN, TIBC, IRON, RETICCTPCT in the last 72 hours. Urinalysis    Component Value Date/Time   COLORURINE YELLOW 07/08/2016 0939   APPEARANCEUR CLOUDY (A) 07/08/2016 0939   LABSPEC 1.015 07/08/2016 0939   PHURINE 5.0 07/08/2016 0939   GLUCOSEU NEGATIVE 07/08/2016 0939   HGBUR MODERATE (A) 07/08/2016 0939   BILIRUBINUR NEGATIVE 07/08/2016 0939   KETONESUR NEGATIVE 07/08/2016 0939   PROTEINUR 30 (A) 07/08/2016 0939   UROBILINOGEN 0.2 04/18/2013 2134   NITRITE NEGATIVE 07/08/2016 0939   LEUKOCYTESUR LARGE (A) 07/08/2016 0939   Sepsis Labs Invalid input(s): PROCALCITONIN,  WBC,  LACTICIDVEN Microbiology Recent Results (from the past 240 hour(s))  Urine Culture     Status: None   Collection Time: 07/03/16 11:41 AM  Result Value Ref Range Status   Specimen Description URINE, CATHETERIZED  Final   Special Requests NONE  Final   Culture   Final    NO GROWTH Performed at [facility], [address], [place], [place] [address]    Report Status 07/04/2016 FINAL  Final  MRSA PCR Screening     Status: None   Collection Time: 07/08/16  6:52 PM  Result Value Ref Range Status   MRSA by PCR NEGATIVE NEGATIVE Final    Comment:        The GeneXpert MRSA Assay (FDA approved for NASAL specimens only), is one component of a comprehensive MRSA colonization surveillance program. It is not intended to diagnose MRSA infection nor to guide or monitor treatment for MRSA infections.      Time coordinating discharge: 30 minutes  SIGNED:   Maxie Barb, MD  [facility] 07/09/2016, 10:41 AM  If 7PM-7AM, please contact  night-coverage [email or web address] Password [email or web address]

## 2016-07-09 NOTE — Progress Notes (Signed)
Report call Manuel Hashimotoatricia  to at [facility]. Pt will be transported via [facility].

## 2016-07-09 NOTE — H&P (Signed)
History and Physical    Manuel Higgins WUJ:[id number] DOB: [birth year] DOA: 07/09/2016  PCP: Mortimer Fries, PA  Patient coming from:SNF  Chief Complaint:returned to ER because of hypoglycemia   HPI: Kalep Full is a 66 y.o. male with medical history significant of prior stroke, vascular dementia, nursing home resident initially admitted after a fall and generalized weakness. The MRI did not show anything acute. UA consistent with UTI. Patient was treated with IV fluid and IV antibiotics. He was discharged home in his mental status improved. Patient has history of diabetes and on both insulin and metformin. He received meal coverage before discharge today. During transportation to skilled facility patient was found to be sleepy when blood sugar was found to be 68. This morning patient had a good oral intake. He was discharged with oral antibiotics and outpatient follow-up with PCP. His diet. Because of sleepiness associated with borderline low blood sugar level. He will be observed overnight with monitoring blood sugar level. ED Course: Drinking juice in ER.  Review of Systems: As per HPI otherwise 10 point review of systems negative.    Past Medical History:  Diagnosis Date  . Anemia   . Dementia    unspecified w/o behavioral disturbance  . Diabetes mellitus without complication (HCC)   . Dysphagia   . Hyperlipidemia   . Hypertension   . Hypertensive crisis 02/15/2013  . Stroke [facility]) 03/2013   Multiple infarcts  . Toe ulcer (HCC)     Past Surgical History:  Procedure Laterality Date  . BACK SURGERY    . LOOP RECORDER IMPLANT  04/08/2013   MDT LinQ implanted by Dr Ladona Ridgel for cryptogenic stroke  . LOOP RECORDER IMPLANT N/A 04/08/2013   Procedure: LOOP RECORDER IMPLANT;  Surgeon: Marinus Maw, MD;  Location: [facility] CATH LAB;  Service: Cardiovascular;  Laterality: N/A;  . TEE WITHOUT CARDIOVERSION N/A 04/07/2013   Procedure: TRANSESOPHAGEAL ECHOCARDIOGRAM (TEE);  Surgeon: Lars Masson,  MD;  Location: [facility] ENDOSCOPY;  Service: Cardiovascular;  Laterality: N/A;  . TONSILLECTOMY       Social history: reports that he quit smoking about 12 years ago. His smoking use included Cigarettes. He has a 3.30 pack-year smoking history. He has never used smokeless tobacco. He reports that he does not drink alcohol or use drugs.  Allergies  Allergen Reactions  . Risperdal [Risperidone] Other (See Comments)    Reaction:  Unknown     Family History  Problem Relation Age of Onset  . Family history unknown: Yes     Prior to Admission medications   Medication Sig Start Date End Date Taking? Authorizing Provider  acetaminophen (TYLENOL) 500 MG tablet Take 500 mg by mouth every 4 (four) hours as needed for mild pain, moderate pain, fever or headache.   Yes [provider]  alum & mag hydroxide-simeth (ALMACONE) 200-200-20 MG/5ML suspension Take 30 mLs by mouth every 6 (six) hours as needed for indigestion or heartburn.    Yes [provider]  amLODipine (NORVASC) 10 MG tablet Take 1 tablet (10 mg total) by mouth daily. 03/31/16  Yes Sheikh, Omair Latif, DO  aspirin 81 MG chewable tablet Chew 1 tablet (81 mg total) by mouth daily. 05/10/15  Yes Selina Cooley, MD  atorvastatin (LIPITOR) 80 MG tablet Take 1 tablet (80 mg total) by mouth daily at 6 PM. 05/10/15  Yes Selina Cooley, MD  baclofen (LIORESAL) 10 MG tablet Take 1 tablet (10 mg total) by mouth at bedtime. 05/10/15  Paulo Fruit, MD  cefpodoxime (VANTIN) 200 MG tablet Take 1 tablet (200 mg total) by mouth 2 (two) times daily. 07/09/16 07/14/16 Yes Maxie Barb, MD  Cholecalciferol (VITAMIN D) 2000 units CAPS Take 2,000 Units by mouth daily.    Yes [provider]  divalproex (DEPAKOTE SPRINKLE) 125 MG capsule Take 1 capsule (125 mg total) by mouth 2 (two) times daily. 07/09/16  Yes Maxie Barb, MD  donepezil (ARICEPT) 5 MG tablet Take 5 mg by mouth at bedtime.   Yes [provider]    FLUoxetine (PROZAC) 40 MG capsule Take 40 mg by mouth daily.    Yes [provider]  gabapentin (NEURONTIN) 100 MG capsule Take 200 mg by mouth 2 (two) times daily.   Yes [provider]  guaifenesin (ROBITUSSIN) 100 MG/5ML syrup Take 200 mg by mouth every 6 (six) hours as needed for cough.   Yes [provider]  hydrALAZINE (APRESOLINE) 25 MG tablet Take 1 tablet (25 mg total) by mouth every 8 (eight) hours. 03/31/16  Yes Sheikh, Omair Latif, DO  insulin aspart (NOVOLOG FLEXPEN) 100 UNIT/ML FlexPen Inject 2-12 Units into the skin 3 (three) times daily with meals. Pt uses per sliding scale:  150-200:  2 units, 201-250:  4 units, 251-300:  6 units, 301-350:  8 units, 351-400:  10 units, > 401:  12 units and call MD.   Yes [provider]  Insulin Detemir (LEVEMIR FLEXTOUCH) 100 UNIT/ML Pen Inject 5 Units into the skin at bedtime.   Yes [provider]  loperamide (IMODIUM) 2 MG capsule Take 2 mg by mouth every 3 (three) hours as needed for diarrhea or loose stools.   Yes [provider]  LORazepam (ATIVAN) 0.5 MG tablet Take 0.25 mg by mouth every 8 (eight) hours as needed for anxiety.   Yes [provider]  LORazepam (ATIVAN) 1 MG tablet Take 1 mg by mouth 3 (three) times daily.   Yes [provider]  magnesium hydroxide (MILK OF MAGNESIA) 400 MG/5ML suspension Take 30 mLs by mouth at bedtime as needed for mild constipation.   Yes [provider]  metFORMIN (GLUCOPHAGE) 500 MG tablet Take 500 mg by mouth 2 (two) times daily with a meal.   Yes [provider]  neomycin-bacitracin-polymyxin (NEOSPORIN) ointment Apply 1 application topically 4 (four) times daily as needed (minor skin tears/abrasions).    Yes [provider]  QUEtiapine (SEROQUEL) 100 MG tablet Take 100 mg by mouth 2 (two) times daily.   Yes [provider]    Physical Exam: Vitals:   07/09/16 1531 07/09/16 1548  BP:  117/67   Pulse:  74  Resp:  12  Temp:  98 F (36.7 C)  TempSrc:  Oral  SpO2: 97% 97%  Weight:  74.8 kg (165 lb)  Height:  5\' 9"  (1.753 m)      Constitutional: NAD, calm, comfortable Vitals:   07/09/16 1531 07/09/16 1548  BP:  117/67  Pulse:  74  Resp:  12  Temp:  98 F (36.7 C)  TempSrc:  Oral  SpO2: 97% 97%  Weight:  74.8 kg (165 lb)  Height:  5\' 9"  (1.753 m)    ENMT: Mucous membranes are moist.   Neck: normal, supple Respiratory: clear to auscultation bilaterally, no wheezing, no crackles. Normal respiratory effort.   Cardiovascular: Regular rate and rhythm, no murmurs / rubs / gallops. No extremity edema. 2+ pedal pulses.  Abdomen:  Bowel sounds positive. Soft, nontender Musculoskeletal: no clubbing /  cyanosis. No joint deformity upper and lower extremities.  Skin: no rashes, lesions, ulcers. No induration Neurologic: Alert, awake, oriented to himself, Hospital. No change in mental status since morning.    Labs on Admission: I have personally reviewed following labs and imaging studies  CBC:  Recent Labs Lab 07/03/16 1134 07/03/16 1453 07/08/16 1014 07/09/16 0526  WBC 6.0 6.2 13.1* 9.5  NEUTROABS 3.8  --  10.2*  --   HGB 8.5* 9.2* 8.3* 8.4*  HCT 25.8* 28.2* 24.9* 24.9*  MCV 94.5 95.9 94.3 95.0  PLT 171 175 221 253   Basic Metabolic Panel:  Recent Labs Lab 07/03/16 1134 07/08/16 1014 07/09/16 0526  NA 141 145 143  K 4.8 4.5 4.3  CL 109 110 109  CO2 26 26 27   GLUCOSE 98 157* 122*  BUN 40* 49* 33*  CREATININE 1.65* 1.77* 1.26*  CALCIUM 8.8* 9.0 8.8*   GFR: Estimated Creatinine Clearance: 59.2 mL/min (A) (by C-G formula based on SCr of 1.26 mg/dL (H)). Liver Function Tests:  Recent Labs Lab 07/03/16 1134  AST 19  ALT 12*  ALKPHOS 98  BILITOT 0.4  PROT 6.6  ALBUMIN 3.8   No results for input(s): LIPASE, AMYLASE in the last 168 hours. No results for input(s): AMMONIA in the last 168 hours. Coagulation Profile:  Recent Labs Lab  07/08/16 1704  INR 0.98   Cardiac Enzymes: No results for input(s): CKTOTAL, CKMB, CKMBINDEX, TROPONINI in the last 168 hours. BNP (last 3 results) No results for input(s): PROBNP in the last 8760 hours. HbA1C: No results for input(s): HGBA1C in the last 72 hours. CBG:  Recent Labs Lab 07/08/16 1754 07/08/16 2110 07/09/16 0727 07/09/16 1248 07/09/16 1534  GLUCAP 114* 168* 165* 137* 56*   Lipid Profile: No results for input(s): CHOL, HDL, LDLCALC, TRIG, CHOLHDL, LDLDIRECT in the last 72 hours. Thyroid Function Tests:  Recent Labs  07/08/16 1704  TSH 2.184   Anemia Panel: No results for input(s): VITAMINB12, FOLATE, FERRITIN, TIBC, IRON, RETICCTPCT in the last 72 hours. Urine analysis:    Component Value Date/Time   COLORURINE YELLOW 07/08/2016 0939   APPEARANCEUR CLOUDY (A) 07/08/2016 0939   LABSPEC 1.015 07/08/2016 0939   PHURINE 5.0 07/08/2016 0939   GLUCOSEU NEGATIVE 07/08/2016 0939   HGBUR MODERATE (A) 07/08/2016 0939   BILIRUBINUR NEGATIVE 07/08/2016 0939   KETONESUR NEGATIVE 07/08/2016 0939   PROTEINUR 30 (A) 07/08/2016 0939   UROBILINOGEN 0.2 04/18/2013 2134   NITRITE NEGATIVE 07/08/2016 0939   LEUKOCYTESUR LARGE (A) 07/08/2016 0939   Sepsis Labs: !!!!!!!!!!!!!!!!!!!!!!!!!!!!!!!!!!!!!!!!!!!! @LABRCNTIP (procalcitonin:4,lacticidven:4) ) Recent Results (from the past 240 hour(s))  Urine Culture     Status: None   Collection Time: 07/03/16 11:41 AM  Result Value Ref Range Status   Specimen Description URINE, CATHETERIZED  Final   Special Requests NONE  Final   Culture   Final    NO GROWTH Performed at [facility], [address] [place], [place] [address]    Report Status 07/04/2016 FINAL  Final  MRSA PCR Screening     Status: None   Collection Time: 07/08/16  6:52 PM  Result Value Ref Range Status   MRSA by PCR NEGATIVE NEGATIVE Final    Comment:        The GeneXpert MRSA Assay (FDA approved for NASAL specimens only), is one component of  a comprehensive MRSA colonization surveillance program. It is not intended to diagnose MRSA infection nor to guide or monitor treatment for MRSA infections.  Radiological Exams on Admission: Dg Chest 2 View  Result Date: 07/08/2016 CLINICAL DATA:  Nursing home Patient had an unwitnessed fall this am. Staff states patient was found on the floor. Pt nonverbally communicative, unable to give hx or assist w/imaging. Pt in improvised cervical immobilizer made from rolled towel and tape. EXAM: CHEST  2 VIEW COMPARISON:  06/07/2016 FINDINGS: Normal mediastinum and cardiac silhouette. Normal pulmonary vasculature. No evidence of effusion, infiltrate, or pneumothorax. No acute bony abnormality. Cardiac recording device noted. IMPRESSION: No acute cardiopulmonary process. Electronically Signed   By: Genevive Bi M.D.   On: 07/08/2016 10:19   Ct Head Wo Contrast  Result Date: 07/08/2016 CLINICAL DATA:  Fall. EXAM: CT HEAD WITHOUT CONTRAST CT CERVICAL SPINE WITHOUT CONTRAST TECHNIQUE: Multidetector CT imaging of the head and cervical spine was performed following the standard protocol without intravenous contrast. Multiplanar CT image reconstructions of the cervical spine were also generated. COMPARISON:  Head CT 07/05/2016 and 05/07/2015 FINDINGS: CT HEAD FINDINGS Brain: The ventricles, cisterns and other CSF spaces are within normal. There is chronic ischemic microvascular disease and minimal age related atrophic change. There is no mass, mass effect, shift of midline structures or acute hemorrhage. No evidence of acute infarction. Vascular: No hyperdense vessel or unexpected calcification. Skull: Normal. Negative for fracture or focal lesion. Sinuses/Orbits: No acute finding. Minimal chronic opacification over the right frontal ethmoidal recess. Other: Improved soft tissue swelling over the right frontal scalp. CT CERVICAL SPINE FINDINGS Alignment: Within normal. Skull base and vertebrae: Mild  spondylosis throughout the cervical spine. Vertebral body heights are maintained. There is minimal uncovertebral joint spurring. Partial fusion of the facet joints from C2-C4 left worse than right. Atlantoaxial articulation is within normal. No evidence of acute fracture or subluxation. Soft tissues and spinal canal: No prevertebral fluid or swelling. No visible canal hematoma. Disc levels:  Within normal. Upper chest: Negative. Other: Moderate calcified plaque at the carotid bifurcations bilaterally. IMPRESSION: No acute intracranial findings. Chronic ischemic microvascular disease and mild age related atrophy. Improved soft tissue swelling over the right frontal scalp. No acute cervical spine injury. Mild spondylosis of the cervical spine. Atherosclerotic plaque at the carotid bifurcations. Electronically Signed   By: Elberta Fortis M.D.   On: 07/08/2016 10:13   Ct Cervical Spine Wo Contrast  Result Date: 07/08/2016 CLINICAL DATA:  Fall. EXAM: CT HEAD WITHOUT CONTRAST CT CERVICAL SPINE WITHOUT CONTRAST TECHNIQUE: Multidetector CT imaging of the head and cervical spine was performed following the standard protocol without intravenous contrast. Multiplanar CT image reconstructions of the cervical spine were also generated. COMPARISON:  Head CT 07/05/2016 and 05/07/2015 FINDINGS: CT HEAD FINDINGS Brain: The ventricles, cisterns and other CSF spaces are within normal. There is chronic ischemic microvascular disease and minimal age related atrophic change. There is no mass, mass effect, shift of midline structures or acute hemorrhage. No evidence of acute infarction. Vascular: No hyperdense vessel or unexpected calcification. Skull: Normal. Negative for fracture or focal lesion. Sinuses/Orbits: No acute finding. Minimal chronic opacification over the right frontal ethmoidal recess. Other: Improved soft tissue swelling over the right frontal scalp. CT CERVICAL SPINE FINDINGS Alignment: Within normal. Skull base and  vertebrae: Mild spondylosis throughout the cervical spine. Vertebral body heights are maintained. There is minimal uncovertebral joint spurring. Partial fusion of the facet joints from C2-C4 left worse than right. Atlantoaxial articulation is within normal. No evidence of acute fracture or subluxation. Soft tissues and spinal canal: No prevertebral fluid or swelling. No visible canal hematoma. Disc levels:  Within normal. Upper chest: Negative. Other: Moderate calcified plaque at the carotid bifurcations bilaterally. IMPRESSION: No acute intracranial findings. Chronic ischemic microvascular disease and mild age related atrophy. Improved soft tissue swelling over the right frontal scalp. No acute cervical spine injury. Mild spondylosis of the cervical spine. Atherosclerotic plaque at the carotid bifurcations. Electronically Signed   By: Elberta Fortis M.D.   On: 07/08/2016 10:13   Mr Brain Wo Contrast  Result Date: 07/08/2016 CLINICAL DATA:  Recurrent falls. Altered mental status. History of stroke. EXAM: MRI HEAD WITHOUT CONTRAST TECHNIQUE: Multiplanar, multiecho pulse sequences of the brain and surrounding structures were obtained without intravenous contrast. COMPARISON:  Head CT 07/08/2016 and MRI 05/07/2015 FINDINGS: Brain: There is no evidence of acute infarct, acute intracranial hemorrhage, mass, midline shift, or extra-axial fluid collection. Patchy to confluent T2 hyperintensities in the cerebral white matter and cerebellum are unchanged from the prior MRI and compatible with advanced chronic small vessel ischemic disease. Numerous small chronic infarcts are again seen throughout the cerebellum bilaterally as well as in the deep cerebral white matter and deep gray nuclei, also unchanged. There is moderate cerebral atrophy. Chronic small vessel ischemic changes and prominent atrophy are noted in the brainstem. A few chronic scattered microhemorrhages are again noted. Vascular: Major intracranial vascular  flow voids are preserved. Skull and upper cervical spine: Unremarkable bone marrow signal. Sinuses/Orbits: Unremarkable orbits. Minimal paranasal sinus mucosal thickening and small right and trace left mastoid effusions. Other: None. IMPRESSION: 1. No acute intracranial abnormality. 2. Advanced chronic small vessel ischemic changes as above. Electronically Signed   By: Sebastian Ache M.D.   On: 07/08/2016 13:53    Assessment/Plan Active Problems:   Acute encephalopathy   Hypoglycemia due to insulin   Hypoglycemia    # Acute encephalopathy likely in the setting of UTI and hypoglycemia: -During that evaluation to my his mental status is around baseline. He has no new focal neurological deficit. MRI on admission unremarkable for acute finding. -Manage hypoglycemia. Holding long-acting insulin and metformin. Monitor blood sugar level. Cover hyperglycemia with sliding scale. Follow-up A1c level. -Change standing Ativan to as needed. Watch for mental status closely.  #UTI likely acute cystitis without hematuria: Treated with IV ceftriaxone and switching to oral cefpodoxime for 5 days on discharge. Clinically improved.  #Fall and generalized weakness likely due to physical deconditioning. MRI no acute finding. Continue rehabilitation and supportive care.  #Type 2 diabetes with hypoglycemia: Holding Levemir and metformin. Follow-up A1c level. Monitor blood sugar level. Patient has very good oral intake.  #Likely vascular dementia with history of behavioral disturbance: Mental status stable. Changed Ativan and as needed. Continue to monitor and provide supportive care.  #Essential hypertension: Continue home medicine. Blood pressure acceptable. Continue to monitor.  DVT prophylaxis: Lovenox subcutaneous Code Status: Full code Family Communication: No family at bedside Disposition Plan: Observation tonight Consults called: None Admission status: Observation   Janalee Grobe Jaynie Collins MD [facility] Pager [phone]  If 7PM-7AM, please contact night-coverage [email or web address] Password [facility]  07/09/2016, 4:32 PM

## 2016-07-09 NOTE — ED Provider Notes (Signed)
[facility]-EMERGENCY DEPT Provider Note   CSN: [id number] Arrival date & time: 07/09/16  1513     History   Chief Complaint Chief Complaint  Patient presents with  . Hypoglycemia    HPI Manuel Higgins is a 66 y.o. male.  66 year old male with history of vascular dementia who presents with hyperglycemia. Patient had just been discharged from the floor and was being transported by [facility] when he seemed to be less responsive. Blood sugar was checked here was 56. Patient was given orange juice. Patient apparently had it a full lunch as well as only had 5 units of regular insulin. No other history obtainable due to his current state      Past Medical History:  Diagnosis Date  . Anemia   . Dementia    unspecified w/o behavioral disturbance  . Diabetes mellitus without complication (HCC)   . Dysphagia   . Hyperlipidemia   . Hypertension   . Hypertensive crisis 02/15/2013  . Stroke [facility]) 03/2013   Multiple infarcts  . Toe ulcer [facility])     Patient Active Problem List   Diagnosis Date Noted  . Confusion 07/08/2016  . UTI (urinary tract infection) 07/08/2016  . Dementia with behavioral disturbance 06/08/2016  . Diabetes mellitus type 2 in nonobese (HCC) 03/28/2016  . Fall 03/28/2016  . ARF (acute renal failure) (HCC) 03/27/2016  . 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 Hypokalemia 02/14/2016  . Leukocytosis 02/14/2016  . Altered mental status 05/07/2015  . History of embolic stroke 05/07/2015  . History of CVA with residual deficit 05/07/2015  . Acute encephalopathy 05/07/2015  . Essential hypertension 04/24/2014  . HLD (hyperlipidemia) 04/24/2014  . Dysphagia 04/04/2013  . Callus of foot 02/16/2013    Past Surgical History:  Procedure Laterality Date  . BACK SURGERY    . LOOP RECORDER IMPLANT  04/08/2013   MDT LinQ implanted by Dr Ladona Ridgel for cryptogenic stroke  . LOOP RECORDER IMPLANT N/A 04/08/2013   Procedure: LOOP RECORDER IMPLANT;   Surgeon: Marinus Maw, MD;  Location: [facility] C[facility] LAB;  Service: Cardiovascular;  Laterality: N/A;  . TEE WITHOUT CARDIOVERSION N/A 04/07/2013   Procedure: TRANSESOPHAGEAL ECHOCARDIOGRAM (TEE);  Surgeon: Lars Masson, MD;  Location: [facility] ENDOSCOPY;  Service: Cardiovascular;  Laterality: N/A;  . TONSILLECTOMY         Home Medications    Prior to Admission medications   Medication Sig Start Date End Date Taking? Authorizing Provider  acetaminophen (TYLENOL) 500 MG tablet Take 500 mg by mouth every 4 (four) hours as needed for mild pain, moderate pain, fever or headache.    [provider]  alum & mag hydroxide-simeth (ALMACONE) 200-200-20 MG/5ML suspension Take 30 mLs by mouth every 6 (six) hours as needed for indigestion or heartburn.     [provider]  amLODipine (NORVASC) 10 MG tablet Take 1 tablet (10 mg total) by mouth daily. 03/31/16   Marguerita Merles Latif, DO  aspirin 81 MG chewable tablet Chew 1 tablet (81 mg total) by mouth daily. 05/10/15   Selina Cooley, MD  atorvastatin (LIPITOR) 80 MG tablet Take 1 tablet (80 mg total) by mouth daily at 6 PM. 05/10/15   Selina Cooley, MD  baclofen (LIORESAL) 10 MG tablet Take 1 tablet (10 mg total) by mouth at bedtime. 05/10/15   Selina Cooley, MD  cefpodoxime (VANTIN) 200 MG tablet Take 1 tablet (200 mg total) by mouth 2 (two) times daily. 07/09/16 07/14/16  Maxie Barb, MD  Cholecalciferol (VITAMIN D) 2000 units CAPS Take 2,000 Units by mouth daily.     [provider]  divalproex (DEPAKOTE SPRINKLE) 125 MG capsule Take 1 capsule (125 mg total) by mouth 2 (two) times daily. 07/09/16   Maxie Barb, MD  donepezil (ARICEPT) 5 MG tablet Take 5 mg by mouth at bedtime.    [provider]  FLUoxetine (PROZAC) 40 MG capsule Take 40 mg by mouth daily.     [provider]  gabapentin (NEURONTIN) 100 MG capsule Take 200 mg by mouth 2 (two) times daily.    [provider]  guaifenesin  (ROBITUSSIN) 100 MG/5ML syrup Take 200 mg by mouth every 6 (six) hours as needed for cough.    [provider]  hydrALAZINE (APRESOLINE) 25 MG tablet Take 1 tablet (25 mg total) by mouth every 8 (eight) hours. 03/31/16   Sheikh, Omair Latif, DO  insulin aspart (NOVOLOG FLEXPEN) 100 UNIT/ML FlexPen Inject 2-12 Units into the skin 3 (three) times daily with meals. Pt uses per sliding scale:  150-200:  2 units, 201-250:  4 units, 251-300:  6 units, 301-350:  8 units, 351-400:  10 units, > 401:  12 units and call MD.    [provider]  Insulin Detemir (LEVEMIR FLEXTOUCH) 100 UNIT/ML Pen Inject 5 Units into the skin at bedtime.    [provider]  loperamide (IMODIUM) 2 MG capsule Take 2 mg by mouth every 3 (three) hours as needed for diarrhea or loose stools.    [provider]  LORazepam (ATIVAN) 0.5 MG tablet Take 0.25 mg by mouth every 8 (eight) hours as needed for anxiety.    [provider]  LORazepam (ATIVAN) 1 MG tablet Take 1 mg by mouth 3 (three) times daily.    [provider]  magnesium hydroxide (MILK OF MAGNESIA) 400 MG/5ML suspension Take 30 mLs by mouth at bedtime as needed for mild constipation.    [provider]  metFORMIN (GLUCOPHAGE) 500 MG tablet Take 500 mg by mouth 2 (two) times daily with a meal.    [provider]  neomycin-bacitracin-polymyxin (NEOSPORIN) ointment Apply 1 application topically 4 (four) times daily as needed (minor skin tears/abrasions).     [provider]  QUEtiapine (SEROQUEL) 100 MG tablet Take 100 mg by mouth 2 (two) times daily.    [provider]    Family History Family History  Problem Relation Age of Onset  . Family history unknown: Yes    Social History Social History  Substance Use Topics  . Smoking status: Former Smoker    Packs/day: 0.33    Years: 10.00    Types: Cigarettes    Quit date: 01/17/2004  . Smokeless tobacco: Never Used  . Alcohol use No      Allergies   Risperdal [risperidone]   Review of Systems Review of Systems  Unable to perform ROS: Dementia     Physical Exam Updated Vital Signs BP 117/67 (BP Location: Right Arm)   Pulse 74   Temp 98 F (36.7 C) (Oral)   Resp 12   Ht 1.753 m (5\' 9" )   Wt 74.8 kg (165 lb)   SpO2 97%   BMI 24.37 kg/m   Physical Exam  Constitutional: He appears well-developed and well-nourished. He appears lethargic.  Non-toxic appearance. No distress.  HENT:  Head: Normocephalic and atraumatic.  Eyes: Conjunctivae, EOM and lids are normal. Pupils are equal, round, and reactive to light.  Neck: Normal range of  motion. Neck supple. No tracheal deviation present. No thyroid mass present.  Cardiovascular: Normal rate, regular rhythm and normal heart sounds.  Exam reveals no gallop.   No murmur heard. Pulmonary/Chest: Effort normal and breath sounds normal. No stridor. No respiratory distress. He has no decreased breath sounds. He has no wheezes. He has no rhonchi. He has no rales.  Abdominal: Soft. Normal appearance and bowel sounds are normal. He exhibits no distension. There is no tenderness. There is no rebound and no CVA tenderness.  Musculoskeletal: Normal range of motion. He exhibits no edema or tenderness.  Neurological: He appears lethargic. He displays atrophy. GCS eye subscore is 2. GCS verbal subscore is 2. GCS motor subscore is 5.  Skin: Skin is warm and dry. No abrasion and no rash noted.  Psychiatric: His affect is blunt.  Nursing note and vitals reviewed.    ED Treatments / Results  Labs (all labs ordered are listed, but only abnormal results are displayed) Labs Reviewed  CBG MONITORING, ED - Abnormal; Notable for the following:       Result Value   Glucose-Capillary 56 (*)    All other components within normal limits    EKG  EKG Interpretation None       Radiology Dg Chest 2 View  Result Date: 07/08/2016 CLINICAL DATA:  Nursing home Patient had an  unwitnessed fall this am. Staff states patient was found on the floor. Pt nonverbally communicative, unable to give hx or assist w/imaging. Pt in improvised cervical immobilizer made from rolled towel and tape. EXAM: CHEST  2 VIEW COMPARISON:  06/07/2016 FINDINGS: Normal mediastinum and cardiac silhouette. Normal pulmonary vasculature. No evidence of effusion, infiltrate, or pneumothorax. No acute bony abnormality. Cardiac recording device noted. IMPRESSION: No acute cardiopulmonary process. Electronically Signed   By: Genevive Bi M.D.   On: 07/08/2016 10:19   Ct Head Wo Contrast  Result Date: 07/08/2016 CLINICAL DATA:  Fall. EXAM: CT HEAD WITHOUT CONTRAST CT CERVICAL SPINE WITHOUT CONTRAST TECHNIQUE: Multidetector CT imaging of the head and cervical spine was performed following the standard protocol without intravenous contrast. Multiplanar CT image reconstructions of the cervical spine were also generated. COMPARISON:  Head CT 07/05/2016 and 05/07/2015 FINDINGS: CT HEAD FINDINGS Brain: The ventricles, cisterns and other CSF spaces are within normal. There is chronic ischemic microvascular disease and minimal age related atrophic change. There is no mass, mass effect, shift of midline structures or acute hemorrhage. No evidence of acute infarction. Vascular: No hyperdense vessel or unexpected calcification. Skull: Normal. Negative for fracture or focal lesion. Sinuses/Orbits: No acute finding. Minimal chronic opacification over the right frontal ethmoidal recess. Other: Improved soft tissue swelling over the right frontal scalp. CT CERVICAL SPINE FINDINGS Alignment: Within normal. Skull base and vertebrae: Mild spondylosis throughout the cervical spine. Vertebral body heights are maintained. There is minimal uncovertebral joint spurring. Partial fusion of the facet joints from C2-C4 left worse than right. Atlantoaxial articulation is within normal. No evidence of acute fracture or subluxation. Soft  tissues and spinal canal: No prevertebral fluid or swelling. No visible canal hematoma. Disc levels:  Within normal. Upper chest: Negative. Other: Moderate calcified plaque at the carotid bifurcations bilaterally. IMPRESSION: No acute intracranial findings. Chronic ischemic microvascular disease and mild age related atrophy. Improved soft tissue swelling over the right frontal scalp. No acute cervical spine injury. Mild spondylosis of the cervical spine. Atherosclerotic plaque at the carotid bifurcations. Electronically Signed   By: Elberta Fortis M.D.   On: 07/08/2016 10:13  Ct Cervical Spine Wo Contrast  Result Date: 07/08/2016 CLINICAL DATA:  Fall. EXAM: CT HEAD WITHOUT CONTRAST CT CERVICAL SPINE WITHOUT CONTRAST TECHNIQUE: Multidetector CT imaging of the head and cervical spine was performed following the standard protocol without intravenous contrast. Multiplanar CT image reconstructions of the cervical spine were also generated. COMPARISON:  Head CT 07/05/2016 and 05/07/2015 FINDINGS: CT HEAD FINDINGS Brain: The ventricles, cisterns and other CSF spaces are within normal. There is chronic ischemic microvascular disease and minimal age related atrophic change. There is no mass, mass effect, shift of midline structures or acute hemorrhage. No evidence of acute infarction. Vascular: No hyperdense vessel or unexpected calcification. Skull: Normal. Negative for fracture or focal lesion. Sinuses/Orbits: No acute finding. Minimal chronic opacification over the right frontal ethmoidal recess. Other: Improved soft tissue swelling over the right frontal scalp. CT CERVICAL SPINE FINDINGS Alignment: Within normal. Skull base and vertebrae: Mild spondylosis throughout the cervical spine. Vertebral body heights are maintained. There is minimal uncovertebral joint spurring. Partial fusion of the facet joints from C2-C4 left worse than right. Atlantoaxial articulation is within normal. No evidence of acute fracture or  subluxation. Soft tissues and spinal canal: No prevertebral fluid or swelling. No visible canal hematoma. Disc levels:  Within normal. Upper chest: Negative. Other: Moderate calcified plaque at the carotid bifurcations bilaterally. IMPRESSION: No acute intracranial findings. Chronic ischemic microvascular disease and mild age related atrophy. Improved soft tissue swelling over the right frontal scalp. No acute cervical spine injury. Mild spondylosis of the cervical spine. Atherosclerotic plaque at the carotid bifurcations. Electronically Signed   By: Elberta Fortisaniel  Boyle M.D.   On: 07/08/2016 10:13   Mr Brain Wo Contrast  Result Date: 07/08/2016 CLINICAL DATA:  Recurrent falls. Altered mental status. History of stroke. EXAM: MRI HEAD WITHOUT CONTRAST TECHNIQUE: Multiplanar, multiecho pulse sequences of the brain and surrounding structures were obtained without intravenous contrast. COMPARISON:  Head CT 07/08/2016 and MRI 05/07/2015 FINDINGS: Brain: There is no evidence of acute infarct, acute intracranial hemorrhage, mass, midline shift, or extra-axial fluid collection. Patchy to confluent T2 hyperintensities in the cerebral white matter and cerebellum are unchanged from the prior MRI and compatible with advanced chronic small vessel ischemic disease. Numerous small chronic infarcts are again seen throughout the cerebellum bilaterally as well as in the deep cerebral white matter and deep gray nuclei, also unchanged. There is moderate cerebral atrophy. Chronic small vessel ischemic changes and prominent atrophy are noted in the brainstem. A few chronic scattered microhemorrhages are again noted. Vascular: Major intracranial vascular flow voids are preserved. Skull and upper cervical spine: Unremarkable bone marrow signal. Sinuses/Orbits: Unremarkable orbits. Minimal paranasal sinus mucosal thickening and small right and trace left mastoid effusions. Other: None. IMPRESSION: 1. No acute intracranial abnormality. 2.  Advanced chronic small vessel ischemic changes as above. Electronically Signed   By: Sebastian AcheAllen  Grady M.D.   On: 07/08/2016 13:53    Procedures Procedures (including critical care time)  Medications Ordered in ED Medications - No data to display   Initial Impression / Assessment and Plan / ED Course  I have reviewed the triage vital signs and the nursing notes.  Pertinent labs & imaging results that were available during my care of the patient were reviewed by me and considered in my medical decision making (see chart for details).     Patient given on issues for hypoglycemia. Discuss with the hospitalist to discharge the patient has morning he will come and see the patient and admit  Final Clinical Impressions(s) /  ED Diagnoses   Final diagnoses:  None    New Prescriptions New Prescriptions   No medications on file     Lorre Nick, MD 07/09/16 1623

## 2016-07-09 NOTE — Clinical Social Work Note (Signed)
Clinical Social Work Assessment  Patient Details  Name: Manuel Higgins MRN: [id number] Date of Birth: 06/23/1950  Date of referral:  07/09/16               Reason for consult:  Facility Placement                Permission sought to share information with:  Facility Industrial/product designerContact Representative Permission granted to share information::  No  Name::        Agency::     Relationship::     Contact Information:     Housing/Transportation Living arrangements for the past 2 months:  Assisted Living Facility Source of Information:  Medical Team Patient Interpreter Needed:  None Criminal Activity/Legal Involvement Pertinent to Current Situation/Hospitalization:  No - Comment as needed Significant Relationships:  Siblings Lives with:  Facility Resident Do you feel safe going back to the place where you live?  Yes Need for family participation in patient care:  No (Coment)  Care giving concerns:  [facility]. Patient could not be aroused for assessment by CSW. No family at bedside. Discharge orders in.   Social Worker assessment / plan: CSW called family and left message requesting call back to give update. CSW consulted with nursing staff who indicated patient is agreeable to return to ALF. CSW confirmed with ALF that they can take patient back today.   Employment status:  Unemployed Health and safety inspectornsurance information:  [place]:  Not assessed at this time Information / Referral to community resources:  Other (Comment Required) ([facility])  Patient/Family's Response to care: CSW unable to assess patient and family did not answer calls.  Patient/Family's Understanding of and Emotional Response to Diagnosis, Current Treatment, and Prognosis:  CSW unable to assess patient and family did not answer calls. CSW will continue to attempt calls to family before discharge complete.  Emotional Assessment Appearance:  Appears stated  age Attitude/Demeanor/Rapport:  Unable to Assess (patient asleep) Affect (typically observed):  Unable to Assess (patient asleep) Orientation:  Oriented to Self, Oriented to Place, Oriented to  Time Alcohol / Substance use:  Not Applicable Psych involvement (Current and /or in the community):  No (Comment)  Discharge Needs  Concerns to be addressed:  Discharge Planning Concerns Readmission within the last 30 days:  No Current discharge risk:  Physical Impairment Barriers to Discharge:  No Barriers Identified   Abigail ButtsSusan Finian Helvey, LCSW 07/09/2016, 11:24 AM

## 2016-07-09 NOTE — Clinical Social Work Placement (Signed)
   CLINICAL SOCIAL WORK PLACEMENT  NOTE  Date:  07/09/2016  Patient Details  Name: Charlynne PanderDarrell Meester MRN: [id number] Date of Birth: 12/15/1950  Clinical Social Work is seeking post-discharge placement for this patient at the Assisted Living Facility level of care (*CSW will initial, date and re-position this form in  chart as items are completed):  Yes   Patient/family provided with [facility] Clinical Social Work Department's list of facilities offering this level of care within the geographic area requested by the patient (or if unable, by the patient's family).  Yes   Patient/family informed of their freedom to choose among providers that offer the needed level of care, that participate in Medicare, Medicaid or managed care program needed by the patient, have an available bed and are willing to accept the patient.  Yes   Patient/family informed of [facility]'s ownership interest in [facility] and [facility], as well as of the fact that they are under no obligation to receive care at these facilities.  PASRR submitted to EDS on       PASRR number received on       Existing PASRR number confirmed on       FL2 transmitted to all facilities in geographic area requested by pt/family on       FL2 transmitted to all facilities within larger geographic area on       Patient informed that his/her managed care company has contracts with or will negotiate with certain facilities, including the following:  Other - please specify in the comment section below: [facility]([place])     Yes   Patient/family informed of bed offers received.  Patient chooses bed at Other - please specify in the comment section below: [facility]([place])     Physician recommends and patient chooses bed at Other - please specify in the comment section below: [facility]([place])    Patient to be transferred to Other - please specify in the comment section below: [facility]([place]) on 07/09/16.  Patient to be  transferred to facility by [facility]     Patient family notified on 07/09/16 of transfer.  Name of family member notified:  Theresia LoPatricia Smith, Sister      PHYSICIAN       Additional Comment:    _______________________________________________ Abigail ButtsSusan Effie Wahlert, LCSW 07/09/2016, 11:30 AM

## 2016-07-09 NOTE — ED Triage Notes (Signed)
[facility], but was admitted for a UTI and AMS. When [facility] was getting ready to transport pt home, he was drowsy and hard to rouse. Per PTAR pt had CBG of 68. Pt had CBG of 56 at time of admission. Pt was given meal coverage prior to transport. Pt also was given a meal, PTAR was told pt ate most of his meal.

## 2016-07-09 NOTE — Discharge Summary (Signed)
Physician Discharge Summary  Manuel Higgins ZOX:[id number] DOB: 1950/11/13 DOA: 07/08/2016  PCP: Mortimer Fries, PA  Admit date: 07/08/2016 Discharge date: 07/09/2016  (The discharge summary was written under hospital progress note today by error).  Admitted From:SNF Disposition:  SNF  Recommendations for Outpatient Follow-up:  1. Follow up with PCP in 1-2 weeks 2. Please obtain BMP/CBC in one week   Home Health:SNF Equipment/Devices:none Discharge Condition:stable CODE STATUS:full code Diet recommendation:carb modified heart healthy diet  Brief/Interim Summary: 66 year old male with history of prior stroke, vascular dementia, nursing home resident presented after a fall and generalized weakness. Initial evaluation including MRI showed no acute event. UA consistent with UTI. Patient was treated with IV fluid and IV antibiotics and admitted for further evaluation.  #Possible acute metabolic encephalopathy in the setting of UTI: Patient is alert awake and oriented to himself and June this morning. He finished his breakfast himself this morning. Mental status improved. MRI with no acute finding. He will be discharged to nursing home where he will get rehabilitation and therapy. Patient will benefit from outpatient PT OT therapy.  #UTI likely acute cystitis without hematuria: Treated with IV ceftriaxone and switching to oral cefpodoxime for 5 days on discharge. Clinically improved.  #Fall and generalized weakness likely due to physical deconditioning. MRI no acute finding. Continue rehabilitation and supportive care.  #Type 2 diabetes: Continue insulin regimen. Monitor blood sugar level and follow-up with PCP.  #Likely vascular dementia with history of behavioral disturbance: Patient was calm comfortable this morning. Continue home medications and supportive care.  #Essential hypertension: Continue home medicine. Blood pressure acceptable. Continue to monitor.  Likely acute  kidney injury in the setting of dehydration and UTI. Serum creatinine level already improved. Recommended to monitor lab in a week. Patient is clinically stable to transfer his care to outpatient.  Discharge Diagnoses:  Principal Problem:   Acute encephalopathy Active Problems:   Essential hypertension   History of embolic stroke   Diabetes mellitus type 2 in nonobese (HCC)   Dementia with behavioral disturbance   Confusion   UTI (urinary tract infection)    Discharge Instructions      Discharge Instructions    Call MD for:  difficulty breathing, headache or visual disturbances    Complete by:  As directed    Call MD for:  extreme fatigue    Complete by:  As directed    Call MD for:  hives    Complete by:  As directed    Call MD for:  persistant dizziness or light-headedness    Complete by:  As directed    Call MD for:  persistant nausea and vomiting    Complete by:  As directed    Call MD for:  severe uncontrolled pain    Complete by:  As directed    Call MD for:  temperature >100.4    Complete by:  As directed    Diet - low sodium heart healthy    Complete by:  As directed    Diet Carb Modified    Complete by:  As directed    Increase activity slowly    Complete by:  As directed          Allergies as of 07/09/2016      Reactions   Risperdal [risperidone] Other (See Comments)   Reaction:  Unknown          Medication List    TAKE these medications   acetaminophen 500 MG tablet Commonly known as:  TYLENOL  Take 500 mg by mouth every 4 (four) hours as needed for mild pain, moderate pain, fever or headache.   ALMACONE 200-200-20 MG/5ML suspension Generic drug:  alum & mag hydroxide-simeth Take 30 mLs by mouth every 6 (six) hours as needed for indigestion or heartburn.   amLODipine 10 MG tablet Commonly known as:  NORVASC Take 1 tablet (10 mg total) by mouth daily.   aspirin 81 MG chewable tablet Chew 1 tablet (81 mg  total) by mouth daily.   atorvastatin 80 MG tablet Commonly known as:  LIPITOR Take 1 tablet (80 mg total) by mouth daily at 6 PM.   baclofen 10 MG tablet Commonly known as:  LIORESAL Take 1 tablet (10 mg total) by mouth at bedtime.   cefpodoxime 200 MG tablet Commonly known as:  VANTIN Take 1 tablet (200 mg total) by mouth 2 (two) times daily.   divalproex 125 MG capsule Commonly known as:  DEPAKOTE SPRINKLE Take 1 capsule (125 mg total) by mouth 2 (two) times daily.   donepezil 5 MG tablet Commonly known as:  ARICEPT Take 5 mg by mouth at bedtime.   FLUoxetine 40 MG capsule Commonly known as:  PROZAC Take 40 mg by mouth daily.   gabapentin 100 MG capsule Commonly known as:  NEURONTIN Take 200 mg by mouth 2 (two) times daily.   guaifenesin 100 MG/5ML syrup Commonly known as:  ROBITUSSIN Take 200 mg by mouth every 6 (six) hours as needed for cough.   hydrALAZINE 25 MG tablet Commonly known as:  APRESOLINE Take 1 tablet (25 mg total) by mouth every 8 (eight) hours.   LEVEMIR FLEXTOUCH 100 UNIT/ML Pen Generic drug:  Insulin Detemir Inject 5 Units into the skin at bedtime.   loperamide 2 MG capsule Commonly known as:  IMODIUM Take 2 mg by mouth every 3 (three) hours as needed for diarrhea or loose stools.   LORazepam 1 MG tablet Commonly known as:  ATIVAN Take 1 mg by mouth 3 (three) times daily.   LORazepam 0.5 MG tablet Commonly known as:  ATIVAN Take 0.25 mg by mouth every 8 (eight) hours as needed for anxiety.   magnesium hydroxide 400 MG/5ML suspension Commonly known as:  MILK OF MAGNESIA Take 30 mLs by mouth at bedtime as needed for mild constipation.   metFORMIN 500 MG tablet Commonly known as:  GLUCOPHAGE Take 500 mg by mouth 2 (two) times daily with a meal.   neomycin-bacitracin-polymyxin ointment Commonly known as:  NEOSPORIN Apply 1 application topically 4 (four) times daily as needed (minor skin tears/abrasions).   NOVOLOG  FLEXPEN 100 UNIT/ML FlexPen Generic drug:  insulin aspart Inject 2-12 Units into the skin 3 (three) times daily with meals. Pt uses per sliding scale:  150-200:  2 units, 201-250:  4 units, 251-300:  6 units, 301-350:  8 units, 351-400:  10 units, > 401:  12 units and call MD.   QUEtiapine 100 MG tablet Commonly known as:  SEROQUEL Take 100 mg by mouth 2 (two) times daily. What changed:  Another medication with the same name was removed. Continue taking this medication, and follow the directions you see here.   Vitamin D 2000 units Caps Take 2,000 Units by mouth daily.         Follow-up Information    Mortimer FriesCurl, [place], [place]. Schedule an appointment as soon as possible for a visit in 1 week(s).   Specialty:  Internal Medicine Contact information: [address] [place] [address] [phone]  Allergies  Allergen Reactions  . Risperdal [Risperidone] Other (See Comments)    Reaction:  Unknown     Consultations: None  Procedures/Studies: None  Subjective: Seen and examined at bedside. He was alert awake and oriented to himself, June. He finished his breakfast this morning himself. Denied headache, dizziness, chest pain, shortness of breath. No urinary symptoms.  Discharge Exam:     Vitals:   07/08/16 2238 07/09/16 0421  BP: (!) 151/67 130/65  Pulse: 78 73  Resp: 16 18  Temp: 97.7 F (36.5 C) 97.5 F (36.4 C)   Vitals:   07/08/16 1554 07/08/16 1724 07/08/16 2238 07/09/16 0421  BP: (!) 169/75 (!) 170/73 (!) 151/67 130/65  Pulse: 70 72 78 73  Resp: 16 14 16 18   Temp:  97.5 F (36.4 C) 97.7 F (36.5 C) 97.5 F (36.4 C)  TempSrc:  Oral Oral Axillary  SpO2: 99% 100% 99% 98%  Weight:      Height:        General: Pt is alert, awake, not in acute distress Cardiovascular: RRR, S1/S2 +, no rubs, no gallops Respiratory: CTA bilaterally, no wheezing, no rhonchi Abdominal: Soft, NT, ND, bowel sounds  + Extremities: no edema, no cyanosis     The results of significant diagnostics from this hospitalization (including imaging, microbiology, ancillary and laboratory) are listed below for reference.     Microbiology:        Recent Results (from the past 240 hour(s))  Urine Culture     Status: None   Collection Time: 07/03/16 11:41 AM  Result Value Ref Range Status   Specimen Description URINE, CATHETERIZED  Final   Special Requests NONE  Final   Culture   Final    NO GROWTH Performed at [facility], [address], [place], [place] [address]    Report Status 07/04/2016 FINAL  Final  MRSA PCR Screening     Status: None   Collection Time: 07/08/16  6:52 PM  Result Value Ref Range Status   MRSA by PCR NEGATIVE NEGATIVE Final    Comment:        The GeneXpert MRSA Assay (FDA approved for NASAL specimens only), is one component of a comprehensive MRSA colonization surveillance program. It is not intended to diagnose MRSA infection nor to guide or monitor treatment for MRSA infections.      Labs: BNP (last 3 results) Recent Labs (within last 365 days)  No results for input(s): BNP in the last 8760 hours.   Basic Metabolic Panel:  Last Labs    Recent Labs Lab 07/03/16 1134 07/08/16 1014 07/09/16 0526  NA 141 145 143  K 4.8 4.5 4.3  CL 109 110 109  CO2 26 26 27   GLUCOSE 98 157* 122*  BUN 40* 49* 33*  CREATININE 1.65* 1.77* 1.26*  CALCIUM 8.8* 9.0 8.8*     Liver Function Tests:  Last Labs    Recent Labs Lab 07/03/16 1134  AST 19  ALT 12*  ALKPHOS 98  BILITOT 0.4  PROT 6.6  ALBUMIN 3.8     Last Labs   No results for input(s): LIPASE, AMYLASE in the last 168 hours.   Last Labs   No results for input(s): AMMONIA in the last 168 hours.   CBC:  Last Labs    Recent Labs Lab 07/03/16 1134 07/03/16 1453 07/08/16 1014 07/09/16 0526  WBC 6.0 6.2 13.1* 9.5  NEUTROABS 3.8  --  10.2*  --   HGB 8.5* 9.2*  8.3*  8.4*  HCT 25.8* 28.2* 24.9* 24.9*  MCV 94.5 95.9 94.3 95.0  PLT 171 175 221 253     Cardiac Enzymes: Last Labs   No results for input(s): CKTOTAL, CKMB, CKMBINDEX, TROPONINI in the last 168 hours.   BNP: Last Labs   Invalid input(s): POCBNP   CBG:  Last Labs    Recent Labs Lab 07/08/16 1754 07/08/16 2110 07/09/16 0727  GLUCAP 114* 168* 165*     D-Dimer Recent Labs (last 2 labs)   No results for input(s): DDIMER in the last 72 hours.   Hgb A1c Recent Labs (last 2 labs)   No results for input(s): HGBA1C in the last 72 hours.   Lipid Profile Recent Labs (last 2 labs)   No results for input(s): CHOL, HDL, LDLCALC, TRIG, CHOLHDL, LDLDIRECT in the last 72 hours.   Thyroid function studies  Recent Labs (last 2 labs)    Recent Labs  07/08/16 1704  TSH 2.184     Anemia work up [employer] (last 2 labs)   No results for input(s): VITAMINB12, FOLATE, FERRITIN, TIBC, IRON, RETICCTPCT in the last 72 hours.   Urinalysis Labs (Brief)          Component Value Date/Time   COLORURINE YELLOW 07/08/2016 0939   APPEARANCEUR CLOUDY (A) 07/08/2016 0939   LABSPEC 1.015 07/08/2016 0939   PHURINE 5.0 07/08/2016 0939   GLUCOSEU NEGATIVE 07/08/2016 0939   HGBUR MODERATE (A) 07/08/2016 0939   BILIRUBINUR NEGATIVE 07/08/2016 0939   KETONESUR NEGATIVE 07/08/2016 0939   PROTEINUR 30 (A) 07/08/2016 0939   UROBILINOGEN 0.2 04/18/2013 2134   NITRITE NEGATIVE 07/08/2016 0939   LEUKOCYTESUR LARGE (A) 07/08/2016 0939     Sepsis Labs Last Labs   Invalid input(s): PROCALCITONIN,  WBC,  LACTICIDVEN   Microbiology        Recent Results (from the past 240 hour(s))  Urine Culture     Status: None   Collection Time: 07/03/16 11:41 AM  Result Value Ref Range Status   Specimen Description URINE, CATHETERIZED  Final   Special Requests NONE  Final   Culture   Final    NO GROWTH Performed at [facility], [address] [place], [place]  [address]    Report Status 07/04/2016 FINAL  Final  MRSA PCR Screening     Status: None   Collection Time: 07/08/16  6:52 PM  Result Value Ref Range Status   MRSA by PCR NEGATIVE NEGATIVE Final    Comment:        The GeneXpert MRSA Assay (FDA approved for NASAL specimens only), is one component of a comprehensive MRSA colonization surveillance program. It is not intended to diagnose MRSA infection nor to guide or monitor treatment for MRSA infections.      Time coordinating discharge: 30 minutes  SIGNED:   Maxie Barb, MD     [facility] 07/09/2016, 10:41 AM  If 7PM-7AM, please contact night-coverage [email or web address] Password [email or web address]

## 2016-07-09 NOTE — Progress Notes (Signed)
[facility] Anticipated discharge date: 07/09/16 Family notified: Theresia Loatricia Smith, sister (left voicemail) Transportation by: Sharin MonsPTAR   CSW signing off.  Abigail ButtsSusan Lexia Vandevender, LCSWA  Clinical Social Worker

## 2016-07-09 NOTE — ED Notes (Signed)
Bed: WA03 Expected date:  Expected time:  Means of arrival:  Comments: Lethargic, low blood sugar

## 2016-07-09 NOTE — ED Notes (Signed)
Upon receiving patient, he was found soiled through his brief and onto the chuck. Pt cleaned and changed into new brief.

## 2016-07-10 ENCOUNTER — Encounter (HOSPITAL_COMMUNITY): Payer: Self-pay

## 2016-07-10 DIAGNOSIS — N390 Urinary tract infection, site not specified: Secondary | ICD-10-CM

## 2016-07-10 DIAGNOSIS — G9340 Encephalopathy, unspecified: Principal | ICD-10-CM

## 2016-07-10 DIAGNOSIS — B9620 Unspecified Escherichia coli [E. coli] as the cause of diseases classified elsewhere: Secondary | ICD-10-CM | POA: Diagnosis not present

## 2016-07-10 DIAGNOSIS — E160 Drug-induced hypoglycemia without coma: Secondary | ICD-10-CM | POA: Diagnosis not present

## 2016-07-10 DIAGNOSIS — T383X5A Adverse effect of insulin and oral hypoglycemic [antidiabetic] drugs, initial encounter: Secondary | ICD-10-CM | POA: Diagnosis not present

## 2016-07-10 LAB — URINE CULTURE

## 2016-07-10 LAB — HEMOGLOBIN A1C
Hgb A1c MFr Bld: 6.2 % — ABNORMAL HIGH (ref 4.8–5.6)
Mean Plasma Glucose: 131 mg/dL

## 2016-07-10 LAB — GLUCOSE, CAPILLARY
GLUCOSE-CAPILLARY: 98 mg/dL (ref 65–99)
GLUCOSE-CAPILLARY: 277 mg/dL — AB (ref 65–99)

## 2016-07-10 MED ORDER — CHLORHEXIDINE GLUCONATE 0.12 % MT SOLN
15.0000 mL | Freq: Two times a day (BID) | OROMUCOSAL | Status: DC
  Administered 2016-07-10: 15 mL via OROMUCOSAL
  Filled 2016-07-10: qty 15

## 2016-07-10 MED ORDER — CEPHALEXIN 500 MG PO CAPS: 500.0000 mg | ORAL_CAPSULE | Freq: Two times a day (BID) | ORAL | 0 refills | Status: AC

## 2016-07-10 MED ORDER — CEPHALEXIN 500 MG PO CAPS: 500.0000 mg | ORAL_CAPSULE | Freq: Two times a day (BID) | ORAL | 0 refills | Status: DC

## 2016-07-10 MED ORDER — ORAL CARE MOUTH RINSE
15.0000 mL | Freq: Two times a day (BID) | OROMUCOSAL | Status: DC
  Administered 2016-07-10: 15 mL via OROMUCOSAL

## 2016-07-10 NOTE — [place] FL2 (Signed)
[place] MEDICAID FL2 LEVEL OF CARE SCREENING TOOL     IDENTIFICATION  Patient Name: Manuel Higgins Birthdate: 12/04/1950 Sex: male Admission Date (Current Location): 07/09/2016  [facility] [place]   Facility and Address:  [facility],  [address], [place] [id number]      Provider Number: [id number]  Attending Physician Name and Address:  Maxie BarbBhandari, Dron Prasad, MD  Relative Name and Phone Number:       Current Level of Care: Hospital Recommended Level of Care: Assisted Living Facility (Special Care Unit) Prior Approval Number:    Date Approved/Denied:   PASRR Number: [phone]  Discharge Plan:  (Special Care Unit)    Current Diagnoses: Patient Active Problem List   Diagnosis Date Noted  . Hypoglycemia 07/09/2016  . Confusion 07/08/2016  . UTI (urinary tract infection) 07/08/2016  . Dementia with behavioral disturbance 06/08/2016  . Diabetes mellitus type 2 in nonobese (HCC) 03/28/2016  . Fall 03/28/2016  . ARF (acute renal failure) (HCC) 03/27/2016  . 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 Hypokalemia 02/14/2016  . Leukocytosis 02/14/2016  . Hypoglycemia due to insulin 06/12/2015  . Altered mental status 05/07/2015  . History of embolic stroke 05/07/2015  . History of CVA with residual deficit 05/07/2015  . Acute encephalopathy 05/07/2015  . Essential hypertension 04/24/2014  . HLD (hyperlipidemia) 04/24/2014  . Dysphagia 04/04/2013  . Callus of foot 02/16/2013    Orientation RESPIRATION BLADDER Height & Weight     Place, Time  Normal Incontinent Weight: 165 lb (74.8 kg) Height:  5\' 9"  (175.3 cm)  BEHAVIORAL SYMPTOMS/MOOD NEUROLOGICAL BOWEL NUTRITION STATUS      Incontinent Diet (No added table salt)  AMBULATORY STATUS COMMUNICATION OF NEEDS Skin   Limited Assist Verbally Normal                       Personal Care Assistance Level of Assistance  Bathing, Feeding,  Dressing Bathing Assistance: Limited assistance Feeding assistance: Independent Dressing Assistance: Limited assistance     Functional Limitations Info  Sight, Hearing, Speech Sight Info: Adequate Hearing Info: Adequate Speech Info: Adequate    SPECIAL CARE FACTORS FREQUENCY                       Contractures Contractures Info: Not present    Additional Factors Info  Code Status, Allergies Code Status Info: Fullcode Allergies Info:  Risperdal Risperidone           Current Medications (07/10/2016):  This is the current hospital active medication list Current Facility-Administered Medications  Medication Dose Route Frequency Provider Last Rate Last Dose  . acetaminophen (TYLENOL) tablet 650 mg  650 mg Oral Q6H PRN Maxie BarbBhandari, Dron Prasad, MD       Or  . acetaminophen (TYLENOL) suppository 650 mg  650 mg Rectal Q6H PRN Maxie BarbBhandari, Dron Prasad, MD      . alum & mag hydroxide-simeth (MAALOX/MYLANTA) 200-200-20 MG/5ML suspension 30 mL  30 mL Oral Q6H PRN Maxie BarbBhandari, Dron Prasad, MD      . amLODipine (NORVASC) tablet 10 mg  10 mg Oral Daily Maxie BarbBhandari, Dron Prasad, MD      . aspirin chewable tablet 81 mg  81 mg Oral Daily Maxie BarbBhandari, Dron Prasad, MD      . atorvastatin (LIPITOR) tablet 80 mg  80 mg Oral q1800 Maxie BarbBhandari, Dron Prasad, MD   80 mg at 07/09/16 1810  . baclofen (LIORESAL) tablet  10 mg  10 mg Oral QHS Maxie Barb, MD   10 mg at 07/09/16 2300  . cefpodoxime Varney Baas) tablet 200 mg  200 mg Oral BID Maxie Barb, MD   200 mg at 07/09/16 2044  . cholecalciferol (VITAMIN D) tablet 2,000 Units  2,000 Units Oral Daily Maxie Barb, MD      . divalproex (DEPAKOTE SPRINKLE) capsule 125 mg  125 mg Oral BID Maxie Barb, MD   125 mg at 07/09/16 2300  . donepezil (ARICEPT) tablet 5 mg  5 mg Oral QHS Maxie Barb, MD   5 mg at 07/09/16 2300  . enoxaparin (LOVENOX) injection 40 mg  40 mg Subcutaneous Q24H Maxie Barb, MD   40 mg at  07/09/16 1810  . FLUoxetine (PROZAC) capsule 40 mg  40 mg Oral Daily Maxie Barb, MD      . gabapentin (NEURONTIN) capsule 200 mg  200 mg Oral BID Maxie Barb, MD   200 mg at 07/09/16 2300  . guaifenesin (ROBITUSSIN) 100 MG/5ML syrup 200 mg  200 mg Oral Q6H PRN Maxie Barb, MD      . hydrALAZINE (APRESOLINE) tablet 25 mg  25 mg Oral Q8H Maxie Barb, MD   25 mg at 07/10/16 0600  . insulin aspart (novoLOG) injection 0-9 Units  0-9 Units Subcutaneous TID WC Maxie Barb, MD      . loperamide (IMODIUM) capsule 2 mg  2 mg Oral Q3H PRN Maxie Barb, MD      . LORazepam (ATIVAN) tablet 1 mg  1 mg Oral Q6H PRN Maxie Barb, MD      . magnesium hydroxide (MILK OF MAGNESIA) suspension 30 mL  30 mL Oral QHS PRN Maxie Barb, MD      . ondansetron [facility]) tablet 4 mg  4 mg Oral Q6H PRN Maxie Barb, MD       Or  . ondansetron [facility]) injection 4 mg  4 mg Intravenous Q6H PRN Maxie Barb, MD      . polyethylene glycol (MIRALAX / GLYCOLAX) packet 17 g  17 g Oral Daily PRN Maxie Barb, MD      . QUEtiapine (SEROQUEL) tablet 100 mg  100 mg Oral BID Maxie Barb, MD   100 mg at 07/09/16 2300     Discharge Medications: Please see discharge summary for a list of discharge medications.  Relevant Imaging Results:  Relevant Lab Results:   Additional Information SSN [id number]  Clearance Coots, LCSW

## 2016-07-10 NOTE — Progress Notes (Signed)
Per nurse patient ready for d/c today. CSW spoke with admisison-Patricia about patient return. CSW completed FL2/No changes. Patient will transport by PTAR. CSW left voicemail for patient sister Elease Hashimotoatricia.

## 2016-07-10 NOTE — Discharge Summary (Addendum)
Physician Discharge Summary  Manuel Higgins UJW:[id number] DOB: 1950-12-11 DOA: 07/09/2016  PCP: Mortimer Fries, PA  Admit date: 07/09/2016 Discharge date: 07/10/2016  Admitted From:ALF Disposition:ALF  Recommendations for Outpatient Follow-up:  1. Follow up with PCP in 1-2 weeks 2. Please obtain BMP/CBC in one week  Home Health:ALF Equipment/Devices:none Discharge Condition:stable CODE STATUS:full Diet recommendation diet with no added salt  Brief/Interim Summary: 67 y.o. male with medical history significant of prior stroke, vascular dementia, nursing home resident initially admitted after a fall and generalized weakness. The MRI did not show anything acute. UA consistent with UTI. Patient was treated with IV fluid and IV antibiotics.His mental status improved and he was discharged to skilled facility yesterday however did not really because of hypoglycemic episode. Patient was kept for observation.  # Acute encephalopathy likely in the setting of UTI and hypoglycemia: -A1c level of 6.2. Blood sugar level improved. I discontinued long-acting insulin and metformin. Continue sliding scale for the management of hyperglycemia as needed. Please monitor blood sugar level at a skilled facility. His mental status significantly improved and close to baseline. He was alert awake and oriented 3. He has a chronic deconditioning because of prior stroke. He has good oral intake. He was calm and comfortable this morning. -continue current dose of ativan as ALF is concern about pt's behavioral issue there. I recommended to follow up with PCP and pt's psychiatrist.  #UTI likely acute cystitis without hematuria: Urine culture growing pansensitive Escherichia coli. Discharging with oral Keflex. Clinically improved.  #Fall and generalized weakness likely due to physical deconditioning. MRI no acute finding. Continue rehabilitation and supportive care.  #Type 2 diabetes with hypoglycemia: Discontinued laminar,  metformin. Continue sliding scale for the management of hypertension. He has good oral intake. Monitor blood sugar level closely and follow-up with PCP.  #Likely vascular dementia with history of behavioral disturbance: Mental status improved.  #Essential hypertension: Continue home medicine. Blood pressure acceptable. Continue to monitor.  Discharge Diagnoses:  Active Problems:   Acute encephalopathy   Hypoglycemia due to insulin   Hypoglycemia    Discharge Instructions  Discharge Instructions    Call MD for:  difficulty breathing, headache or visual disturbances    Complete by:  As directed    Call MD for:  extreme fatigue    Complete by:  As directed    Call MD for:  hives    Complete by:  As directed    Call MD for:  persistant dizziness or light-headedness    Complete by:  As directed    Call MD for:  persistant nausea and vomiting    Complete by:  As directed    Call MD for:  severe uncontrolled pain    Complete by:  As directed    Call MD for:  temperature >100.4    Complete by:  As directed    Diet - low sodium heart healthy    Complete by:  As directed    Discharge instructions    Complete by:  As directed    Discontinued long-acting insulin, metformin and standing Ativan. Please monitor blood sugar level and cover hyperglycemia with sliding scale. Please watch blood sugar level closely.   Increase activity slowly    Complete by:  As directed      Allergies as of 07/10/2016      Reactions   Risperdal [risperidone] Other (See Comments)   Reaction:  Unknown       Medication List    STOP taking these medications  cefpodoxime 200 MG tablet Commonly known as:  VANTIN   LEVEMIR FLEXTOUCH 100 UNIT/ML Pen Generic drug:  Insulin Detemir   metFORMIN 500 MG tablet Commonly known as:  GLUCOPHAGE     TAKE these medications   acetaminophen 500 MG tablet Commonly known as:  TYLENOL Take 500 mg by mouth every 4 (four) hours as needed for mild pain, moderate  pain, fever or headache.   ALMACONE 200-200-20 MG/5ML suspension Generic drug:  alum & mag hydroxide-simeth Take 30 mLs by mouth every 6 (six) hours as needed for indigestion or heartburn.   amLODipine 10 MG tablet Commonly known as:  NORVASC Take 1 tablet (10 mg total) by mouth daily.   aspirin 81 MG chewable tablet Chew 1 tablet (81 mg total) by mouth daily.   atorvastatin 80 MG tablet Commonly known as:  LIPITOR Take 1 tablet (80 mg total) by mouth daily at 6 PM.   baclofen 10 MG tablet Commonly known as:  LIORESAL Take 1 tablet (10 mg total) by mouth at bedtime.   cephALEXin 500 MG capsule Commonly known as:  KEFLEX Take 1 capsule (500 mg total) by mouth 2 (two) times daily.   divalproex 125 MG capsule Commonly known as:  DEPAKOTE SPRINKLE Take 1 capsule (125 mg total) by mouth 2 (two) times daily.   donepezil 5 MG tablet Commonly known as:  ARICEPT Take 5 mg by mouth at bedtime.   FLUoxetine 40 MG capsule Commonly known as:  PROZAC Take 40 mg by mouth daily.   gabapentin 100 MG capsule Commonly known as:  NEURONTIN Take 200 mg by mouth 2 (two) times daily.   guaifenesin 100 MG/5ML syrup Commonly known as:  ROBITUSSIN Take 200 mg by mouth every 6 (six) hours as needed for cough.   hydrALAZINE 25 MG tablet Commonly known as:  APRESOLINE Take 1 tablet (25 mg total) by mouth every 8 (eight) hours.   loperamide 2 MG capsule Commonly known as:  IMODIUM Take 2 mg by mouth every 3 (three) hours as needed for diarrhea or loose stools.   LORazepam 1 MG tablet Commonly known as:  ATIVAN Take 1 mg by mouth 3 (three) times daily.   LORazepam 0.5 MG tablet Commonly known as:  ATIVAN Take 0.25 mg by mouth every 8 (eight) hours as needed for anxiety.   magnesium hydroxide 400 MG/5ML suspension Commonly known as:  MILK OF MAGNESIA Take 30 mLs by mouth at bedtime as needed for mild constipation.   neomycin-bacitracin-polymyxin ointment Commonly known as:   NEOSPORIN Apply 1 application topically 4 (four) times daily as needed (minor skin tears/abrasions).   NOVOLOG FLEXPEN 100 UNIT/ML FlexPen Generic drug:  insulin aspart Inject 2-12 Units into the skin 3 (three) times daily with meals. Pt uses per sliding scale:  150-200:  2 units, 201-250:  4 units, 251-300:  6 units, 301-350:  8 units, 351-400:  10 units, > 401:  12 units and call MD.   QUEtiapine 100 MG tablet Commonly known as:  SEROQUEL Take 100 mg by mouth 2 (two) times daily.   Vitamin D 2000 units Caps Take 2,000 Units by mouth daily.      Follow-up Information    Mortimer Fries, [place]. Schedule an appointment as soon as possible for a visit in 1 week(s).   Specialty:  Internal Medicine Contact information: [address] [place] [address] [phone]          Allergies  Allergen Reactions  . Risperdal [Risperidone] Other (  See Comments)    Reaction:  Unknown     Consultations: None  Procedures/Studies: None  Subjective: Seen and examined at bedside. He was alert awake and oriented. Denies any needs. Denied headache, dizziness, chills, nausea, vomiting, chest or shortness of breath.  Discharge Exam: Vitals:   07/09/16 2022 07/10/16 0503  BP: (!) 175/72 130/64  Pulse: 78 72  Resp: 20 18  Temp: 98 F (36.7 C) 97.7 F (36.5 C)   Vitals:   07/09/16 1654 07/09/16 1726 07/09/16 2022 07/10/16 0503  BP: (!) 162/83 131/60 (!) 175/72 130/64  Pulse: 77 77 78 72  Resp: 14 15 20 18   Temp:  97.7 F (36.5 C) 98 F (36.7 C) 97.7 F (36.5 C)  TempSrc:  Oral Oral Oral  SpO2: 99% 99% 100% 98%  Weight:      Height:        General: Pt is alert, awake, not in acute distress Cardiovascular: RRR, S1/S2 +, no rubs, no gallops Respiratory: CTA bilaterally, no wheezing, no rhonchi Abdominal: Soft, NT, ND, bowel sounds + Extremities: no edema, no cyanosis    The results of significant diagnostics from this hospitalization (including imaging,  microbiology, ancillary and laboratory) are listed below for reference.     Microbiology: Recent Results (from the past 240 hour(s))  Urine Culture     Status: None   Collection Time: 07/03/16 11:41 AM  Result Value Ref Range Status   Specimen Description URINE, CATHETERIZED  Final   Special Requests NONE  Final   Culture   Final    NO GROWTH Performed at [facility], [address], [place], [place] [address]    Report Status 07/04/2016 FINAL  Final  Urine Culture     Status: Abnormal   Collection Time: 07/08/16  9:39 AM  Result Value Ref Range Status   Specimen Description URINE, RANDOM  Final   Special Requests NONE  Final   Culture >=100,000 COLONIES/mL ESCHERICHIA COLI (A)  Final   Report Status 07/10/2016 FINAL  Final   Organism ID, Bacteria ESCHERICHIA COLI (A)  Final      Susceptibility   Escherichia coli - MIC*    AMPICILLIN <=2 SENSITIVE Sensitive     CEFAZOLIN <=4 SENSITIVE Sensitive     CEFTRIAXONE <=1 SENSITIVE Sensitive     CIPROFLOXACIN <=0.25 SENSITIVE Sensitive     GENTAMICIN <=1 SENSITIVE Sensitive     IMIPENEM <=0.25 SENSITIVE Sensitive     NITROFURANTOIN <=16 SENSITIVE Sensitive     TRIMETH/SULFA <=20 SENSITIVE Sensitive     AMPICILLIN/SULBACTAM <=2 SENSITIVE Sensitive     PIP/TAZO <=4 SENSITIVE Sensitive     Extended ESBL NEGATIVE Sensitive     * >=100,000 COLONIES/mL ESCHERICHIA COLI  MRSA PCR Screening     Status: None   Collection Time: 07/08/16  6:52 PM  Result Value Ref Range Status   MRSA by PCR NEGATIVE NEGATIVE Final    Comment:        The GeneXpert MRSA Assay (FDA approved for NASAL specimens only), is one component of a comprehensive MRSA colonization surveillance program. It is not intended to diagnose MRSA infection nor to guide or monitor treatment for MRSA infections.      Labs: BNP (last 3 results) No results for input(s): BNP in the last 8760 hours. Basic Metabolic Panel:  Recent Labs Lab 07/08/16 1014  07/09/16 0526  NA 145 143  K 4.5 4.3  CL 110 109  CO2 26 27  GLUCOSE 157* 122*  BUN 49* 33*  CREATININE 1.77* 1.26*  CALCIUM 9.0 8.8*   Liver Function Tests: No results for input(s): AST, ALT, ALKPHOS, BILITOT, PROT, ALBUMIN in the last 168 hours. No results for input(s): LIPASE, AMYLASE in the last 168 hours. No results for input(s): AMMONIA in the last 168 hours. CBC:  Recent Labs Lab 07/03/16 1453 07/08/16 1014 07/09/16 0526  WBC 6.2 13.1* 9.5  NEUTROABS  --  10.2*  --   HGB 9.2* 8.3* 8.4*  HCT 28.2* 24.9* 24.9*  MCV 95.9 94.3 95.0  PLT 175 221 253   Cardiac Enzymes: No results for input(s): CKTOTAL, CKMB, CKMBINDEX, TROPONINI in the last 168 hours. BNP: Invalid input(s): POCBNP CBG:  Recent Labs Lab 07/09/16 1534 07/09/16 1658 07/09/16 2024 07/10/16 0739 07/10/16 1152  GLUCAP 56* 88 129* 98 277*   D-Dimer No results for input(s): DDIMER in the last 72 hours. Hgb A1c  Recent Labs  07/08/16 1704  HGBA1C 6.2*   Lipid Profile No results for input(s): CHOL, HDL, LDLCALC, TRIG, CHOLHDL, LDLDIRECT in the last 72 hours. Thyroid function studies  Recent Labs  07/08/16 1704  TSH 2.184   Anemia work up No results for input(s): VITAMINB12, FOLATE, FERRITIN, TIBC, IRON, RETICCTPCT in the last 72 hours. Urinalysis    Component Value Date/Time   COLORURINE YELLOW 07/08/2016 0939   APPEARANCEUR CLOUDY (A) 07/08/2016 0939   LABSPEC 1.015 07/08/2016 0939   PHURINE 5.0 07/08/2016 0939   GLUCOSEU NEGATIVE 07/08/2016 0939   HGBUR MODERATE (A) 07/08/2016 0939   BILIRUBINUR NEGATIVE 07/08/2016 0939   KETONESUR NEGATIVE 07/08/2016 0939   PROTEINUR 30 (A) 07/08/2016 0939   UROBILINOGEN 0.2 04/18/2013 2134   NITRITE NEGATIVE 07/08/2016 0939   LEUKOCYTESUR LARGE (A) 07/08/2016 0939   Sepsis Labs Invalid input(s): PROCALCITONIN,  WBC,  LACTICIDVEN Microbiology Recent Results (from the past 240 hour(s))  Urine Culture     Status: None   Collection Time:  07/03/16 11:41 AM  Result Value Ref Range Status   Specimen Description URINE, CATHETERIZED  Final   Special Requests NONE  Final   Culture   Final    NO GROWTH Performed at [facility], [address] [place], [place] [id number]    Report Status 07/04/2016 FINAL  Final  Urine Culture     Status: Abnormal   Collection Time: 07/08/16  9:39 AM  Result Value Ref Range Status   Specimen Description URINE, RANDOM  Final   Special Requests NONE  Final   Culture >=100,000 COLONIES/mL ESCHERICHIA COLI (A)  Final   Report Status 07/10/2016 FINAL  Final   Organism ID, Bacteria ESCHERICHIA COLI (A)  Final      Susceptibility   Escherichia coli - MIC*    AMPICILLIN <=2 SENSITIVE Sensitive     CEFAZOLIN <=4 SENSITIVE Sensitive     CEFTRIAXONE <=1 SENSITIVE Sensitive     CIPROFLOXACIN <=0.25 SENSITIVE Sensitive     GENTAMICIN <=1 SENSITIVE Sensitive     IMIPENEM <=0.25 SENSITIVE Sensitive     NITROFURANTOIN <=16 SENSITIVE Sensitive     TRIMETH/SULFA <=20 SENSITIVE Sensitive     AMPICILLIN/SULBACTAM <=2 SENSITIVE Sensitive     PIP/TAZO <=4 SENSITIVE Sensitive     Extended ESBL NEGATIVE Sensitive     * >=100,000 COLONIES/mL ESCHERICHIA COLI  MRSA PCR Screening     Status: None   Collection Time: 07/08/16  6:52 PM  Result Value Ref Range Status   MRSA by PCR NEGATIVE NEGATIVE Final    Comment:  The GeneXpert MRSA Assay (FDA approved for NASAL specimens only), is one component of a comprehensive MRSA colonization surveillance program. It is not intended to diagnose MRSA infection nor to guide or monitor treatment for MRSA infections.      Time coordinating discharge: 28 minutes  SIGNED:   Maxie Barb, MD  [facility] 07/10/2016, 12:04 PM  If 7PM-7AM, please contact night-coverage [email or web address] Password [email or web address]

## 2016-07-10 NOTE — Care Management Note (Signed)
Case Management Note  Patient Details  Name: Manuel Higgins MRN: [id number] Date of Birth: 12/01/1950  Subjective/Objective:  66 y/o m admitted w/Acute encephalopathy. From ALF-[facility]. CSW following for return ALF. No CM needs.                  Action/Plan:d/c ALF   Expected Discharge Date:  07/10/16               Expected Discharge Plan:  Assisted Living / Rest Home  In-House Referral:     Discharge planning Services     Post Acute Care Choice:    Choice offered to:     DME Arranged:    DME Agency:     HH Arranged:    HH Agency:     Status of Service:  Completed, signed off  If discussed at [employer] Length of Stay Meetings, dates discussed:    Additional Comments:  Lanier ClamMahabir, Nathalia Wismer, RN 07/10/2016, 12:02 PM

## 2016-07-10 NOTE — Progress Notes (Signed)
Report called to [facility]. [facility] transporting pt to ALF.

## 2016-07-10 NOTE — Progress Notes (Signed)
Pt is awake and alert sitting up in the bed. Pts assessment is unchanged from morning RN assessment. Will continue to monitor.

## 2016-07-13 ENCOUNTER — Encounter (HOSPITAL_COMMUNITY): Payer: Self-pay | Admitting: *Deleted

## 2016-07-13 ENCOUNTER — Emergency Department (HOSPITAL_COMMUNITY): Payer: Medicaid Other

## 2016-07-13 ENCOUNTER — Emergency Department (HOSPITAL_COMMUNITY)
Admission: EM | Admit: 2016-07-13 | Discharge: 2016-07-13 | Disposition: A | Discharge status: Home / Self Care | Payer: Medicaid Other | Attending: Emergency Medicine | Admitting: Emergency Medicine

## 2016-07-13 DIAGNOSIS — Y999 Unspecified external cause status: Secondary | ICD-10-CM | POA: Insufficient documentation

## 2016-07-13 DIAGNOSIS — Z87891 Personal history of nicotine dependence: Secondary | ICD-10-CM | POA: Insufficient documentation

## 2016-07-13 DIAGNOSIS — Z794 Long term (current) use of insulin: Secondary | ICD-10-CM | POA: Insufficient documentation

## 2016-07-13 DIAGNOSIS — Y92002 Bathroom of unspecified non-institutional (private) residence single-family (private) house as the place of occurrence of the external cause: Secondary | ICD-10-CM | POA: Insufficient documentation

## 2016-07-13 DIAGNOSIS — Z8673 Personal history of transient ischemic attack (TIA), and cerebral infarction without residual deficits: Secondary | ICD-10-CM | POA: Insufficient documentation

## 2016-07-13 DIAGNOSIS — Y939 Activity, unspecified: Secondary | ICD-10-CM | POA: Insufficient documentation

## 2016-07-13 DIAGNOSIS — Z79899 Other long term (current) drug therapy: Secondary | ICD-10-CM | POA: Diagnosis not present

## 2016-07-13 DIAGNOSIS — I10 Essential (primary) hypertension: Secondary | ICD-10-CM | POA: Insufficient documentation

## 2016-07-13 DIAGNOSIS — S0990XA Unspecified injury of head, initial encounter: Secondary | ICD-10-CM | POA: Diagnosis present

## 2016-07-13 DIAGNOSIS — E119 Type 2 diabetes mellitus without complications: Secondary | ICD-10-CM | POA: Diagnosis not present

## 2016-07-13 DIAGNOSIS — S0083XA Contusion of other part of head, initial encounter: Secondary | ICD-10-CM | POA: Diagnosis not present

## 2016-07-13 DIAGNOSIS — W19XXXA Unspecified fall, initial encounter: Secondary | ICD-10-CM | POA: Insufficient documentation

## 2016-07-13 DIAGNOSIS — Z7982 Long term (current) use of aspirin: Secondary | ICD-10-CM | POA: Insufficient documentation

## 2016-07-13 NOTE — ED Triage Notes (Signed)
Please see triage note.  Pt reports he fell while he was trying to cook something.  Pt was found in the BR.  Pt is A&Ox 4.  Denies any pain at this time.  Per nsg facility's staff, pt has fallen several times lately.  The bruising noted on his forehead is the newest injury from the fall.  Pt states he is hungry and is requesting for food.

## 2016-07-13 NOTE — ED Provider Notes (Signed)
[facility]-EMERGENCY DEPT Provider Note   CSN: [id number] Arrival date & time: 07/13/16  1151     History   Chief Complaint Chief Complaint  Patient presents with  . Fall    HPI Manuel Higgins is a 66 y.o. male.  66 year old male with past medical history below including dementia, CVA who presents with fall. Patient was brought from his nursing facility where they reported that he was found on the floor naked in his bathroom this morning with apparent unwitnessed fall. He has fallen several times recently. He complains of any sources of pain and states that he is hungry.  LEVEL 5 CAVEAT DUE TO DEMENTIA   The history is provided by the EMS personnel and the nursing home.  Fall     Past Medical History:  Diagnosis Date  . Anemia   . Dementia    unspecified w/o behavioral disturbance  . Diabetes mellitus without complication (HCC)   . Dysphagia   . Hyperlipidemia   . Hypertension   . Hypertensive crisis 02/15/2013  . Stroke [facility](HCC) 03/2013   Multiple infarcts  . Toe ulcer [facility](HCC)     Patient Active Problem List   Diagnosis Date Noted  . Hypoglycemia 07/09/2016  . Confusion 07/08/2016  . E. coli UTI 07/08/2016  . Dementia with behavioral disturbance 06/08/2016  . Diabetes mellitus type 2 in nonobese (HCC) 03/28/2016  . Fall 03/28/2016  . ARF (acute renal failure) (HCC) 03/27/2016  . 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 Hypokalemia 02/14/2016  . Leukocytosis 02/14/2016  . Hypoglycemia due to insulin 06/12/2015  . Altered mental status 05/07/2015  . History of embolic stroke 05/07/2015  . History of CVA with residual deficit 05/07/2015  . Acute encephalopathy 05/07/2015  . Essential hypertension 04/24/2014  . HLD (hyperlipidemia) 04/24/2014  . Dysphagia 04/04/2013  . Callus of foot 02/16/2013    Past Surgical History:  Procedure Laterality Date  . BACK SURGERY    . LOOP RECORDER IMPLANT  04/08/2013   MDT LinQ implanted by Dr  Ladona Ridgelaylor for cryptogenic stroke  . LOOP RECORDER IMPLANT N/A 04/08/2013   Procedure: LOOP RECORDER IMPLANT;  Surgeon: Marinus MawGregg W Taylor, MD;  [facility];  Service: Cardiovascular;  Laterality: N/A;  . TEE WITHOUT CARDIOVERSION N/A 04/07/2013   Procedure: TRANSESOPHAGEAL ECHOCARDIOGRAM (TEE);  Surgeon: Lars MassonKatarina H Nelson, MD;  [facility];  Service: Cardiovascular;  Laterality: N/A;  . TONSILLECTOMY         Home Medications    Prior to Admission medications   Medication Sig Start Date End Date Taking? Authorizing Provider  acetaminophen (TYLENOL) 500 MG tablet Take 500 mg by mouth every 4 (four) hours as needed for mild pain, moderate pain, fever or headache.    [provider]  alum & mag hydroxide-simeth (ALMACONE) 200-200-20 MG/5ML suspension Take 30 mLs by mouth every 6 (six) hours as needed for indigestion or heartburn.     [provider]  amLODipine (NORVASC) 10 MG tablet Take 1 tablet (10 mg total) by mouth daily. 03/31/16   Marguerita MerlesSheikh, Omair Latif, DO  aspirin 81 MG chewable tablet Chew 1 tablet (81 mg total) by mouth daily. 05/10/15   Selina CooleyFlores, Kyle, MD  atorvastatin (LIPITOR) 80 MG tablet Take 1 tablet (80 mg total) by mouth daily at 6 PM. 05/10/15   Selina CooleyFlores, Kyle, MD  baclofen (LIORESAL) 10 MG tablet Take 1 tablet (10 mg total) by mouth at bedtime. 05/10/15   Selina CooleyFlores, Kyle, MD  cephALEXin [facility](KEFLEX)  500 MG capsule Take 1 capsule (500 mg total) by mouth 2 (two) times daily. Till 07/15/2016 07/10/16 07/15/16  Maxie Barb, MD  Cholecalciferol (VITAMIN D) 2000 units CAPS Take 2,000 Units by mouth daily.     [provider]  divalproex (DEPAKOTE SPRINKLE) 125 MG capsule Take 1 capsule (125 mg total) by mouth 2 (two) times daily. 07/09/16   Maxie Barb, MD  donepezil (ARICEPT) 5 MG tablet Take 5 mg by mouth at bedtime.    [provider]  FLUoxetine (PROZAC) 40 MG capsule Take 40 mg by mouth daily.     [provider]    gabapentin (NEURONTIN) 100 MG capsule Take 200 mg by mouth 2 (two) times daily.    [provider]  guaifenesin (ROBITUSSIN) 100 MG/5ML syrup Take 200 mg by mouth every 6 (six) hours as needed for cough.    [provider]  hydrALAZINE (APRESOLINE) 25 MG tablet Take 1 tablet (25 mg total) by mouth every 8 (eight) hours. 03/31/16   Sheikh, Omair Latif, DO  insulin aspart (NOVOLOG FLEXPEN) 100 UNIT/ML FlexPen Inject 2-12 Units into the skin 3 (three) times daily with meals. Pt uses per sliding scale:  150-200:  2 units, 201-250:  4 units, 251-300:  6 units, 301-350:  8 units, 351-400:  10 units, > 401:  12 units and call MD.    [provider]  loperamide (IMODIUM) 2 MG capsule Take 2 mg by mouth every 3 (three) hours as needed for diarrhea or loose stools.    [provider]  LORazepam (ATIVAN) 0.5 MG tablet Take 0.25 mg by mouth every 8 (eight) hours as needed for anxiety.    [provider]  LORazepam (ATIVAN) 1 MG tablet Take 1 mg by mouth 3 (three) times daily.    [provider]  magnesium hydroxide (MILK OF MAGNESIA) 400 MG/5ML suspension Take 30 mLs by mouth at bedtime as needed for mild constipation.    [provider]  neomycin-bacitracin-polymyxin (NEOSPORIN) ointment Apply 1 application topically 4 (four) times daily as needed (minor skin tears/abrasions).     [provider]  QUEtiapine (SEROQUEL) 100 MG tablet Take 100 mg by mouth 2 (two) times daily.    [provider]    Family History Family History  Problem Relation Age of Onset  . Family history unknown: Yes    Social History Social History  Substance Use Topics  . Smoking status: Former Smoker    Packs/day: 0.33    Years: 10.00    Types: Cigarettes    Quit date: 01/17/2004  . Smokeless tobacco: Never Used  . Alcohol use No     Allergies   Risperdal [risperidone]   Review of Systems Review of Systems  Unable to perform ROS: Dementia      Physical Exam Updated Vital Signs BP 127/74 (BP Location: Right Arm)   Pulse 77   Temp 98.1 F (36.7 C) (Oral)   Resp 16   SpO2 100%   Physical Exam  Constitutional: He appears well-developed and well-nourished. No distress.  HENT:  Head: Normocephalic.  Contusion R forehead with old ecchymoses below eyes   Eyes: Conjunctivae are normal. Pupils are equal, round, and reactive to light.  Neck:  In soft collar  Cardiovascular: Normal rate, regular rhythm and normal heart sounds.   No murmur heard. Pulmonary/Chest: Effort normal and breath sounds normal.  Abdominal: Soft. Bowel sounds are normal. He exhibits no distension. There is no tenderness.  Musculoskeletal:  He exhibits no edema, tenderness or deformity.  Neurological: He is alert.  Oriented to person, moving all 4 extremities  Skin: Skin is warm and dry.  Racoon eyes (old), contusion R forehead  Nursing note and vitals reviewed.    ED Treatments / Results  Labs (all labs ordered are listed, but only abnormal results are displayed) Labs Reviewed - No data to display  EKG  EKG Interpretation None       Radiology Ct Head Wo Contrast  Result Date: 07/13/2016 CLINICAL DATA:  Pain after fall. EXAM: CT HEAD WITHOUT CONTRAST CT CERVICAL SPINE WITHOUT CONTRAST TECHNIQUE: Multidetector CT imaging of the head and cervical spine was performed following the standard protocol without intravenous contrast. Multiplanar CT image reconstructions of the cervical spine were also generated. COMPARISON:  July 08, 2016 FINDINGS: CT HEAD FINDINGS Brain: No subdural, epidural, or subarachnoid hemorrhage. Age related atrophy is identified with mild prominence of the sulci and ventricles, unchanged. The cerebellum, brainstem, and basal cisterns are unchanged. White matter changes are stable. No acute cortical ischemia or infarct. No mass effect or midline shift. Vascular: Calcified atherosclerosis is seen in the intracranial carotid  arteries. Skull: Normal. Negative for fracture or focal lesion. Sinuses/Orbits: No acute finding. Other: Mild soft tissue swelling over the right forehead is identified. CT CERVICAL SPINE FINDINGS Alignment: There is reversal of normal lordosis due to flexion of the neck. No traumatic malalignment. Skull base and vertebrae: No acute fracture. No primary bone lesion or focal pathologic process. Soft tissues and spinal canal: Carotid calcifications identified. No other significant soft tissue abnormalities. Disc levels:  Multilevel degenerative changes. Upper chest: Negative. Other: No other abnormalities are identified. IMPRESSION: 1. No acute intracranial process. 2. No fracture or traumatic malalignment in the cervical spine. Degenerative changes. Electronically Signed   By: Gerome Sam III M.D   On: 07/13/2016 13:57   Ct Cervical Spine Wo Contrast  Result Date: 07/13/2016 CLINICAL DATA:  Pain after fall. EXAM: CT HEAD WITHOUT CONTRAST CT CERVICAL SPINE WITHOUT CONTRAST TECHNIQUE: Multidetector CT imaging of the head and cervical spine was performed following the standard protocol without intravenous contrast. Multiplanar CT image reconstructions of the cervical spine were also generated. COMPARISON:  July 08, 2016 FINDINGS: CT HEAD FINDINGS Brain: No subdural, epidural, or subarachnoid hemorrhage. Age related atrophy is identified with mild prominence of the sulci and ventricles, unchanged. The cerebellum, brainstem, and basal cisterns are unchanged. White matter changes are stable. No acute cortical ischemia or infarct. No mass effect or midline shift. Vascular: Calcified atherosclerosis is seen in the intracranial carotid arteries. Skull: Normal. Negative for fracture or focal lesion. Sinuses/Orbits: No acute finding. Other: Mild soft tissue swelling over the right forehead is identified. CT CERVICAL SPINE FINDINGS Alignment: There is reversal of normal lordosis due to flexion of the neck. No traumatic  malalignment. Skull base and vertebrae: No acute fracture. No primary bone lesion or focal pathologic process. Soft tissues and spinal canal: Carotid calcifications identified. No other significant soft tissue abnormalities. Disc levels:  Multilevel degenerative changes. Upper chest: Negative. Other: No other abnormalities are identified. IMPRESSION: 1. No acute intracranial process. 2. No fracture or traumatic malalignment in the cervical spine. Degenerative changes. Electronically Signed   By: Gerome Sam III M.D   On: 07/13/2016 13:57    Procedures Procedures (including critical care time)  Medications Ordered in ED Medications - No data to display   Initial Impression / Assessment and Plan / ED Course  I have reviewed the triage  vital signs and the nursing notes.  Pertinent imaging results that were available during my care of the patient were reviewed by me and considered in my medical decision making (see chart for details).     PT w/ falls at Nursing facility. He was comfortable on exam with normal vital signs. No complaints. The only trauma I noted was to his head and some of the bruising appeared old. Obtained CT of head and C-spine which were negative for acute injury. The patient ate a lunch tray as well as exam which with no problems. He has been observed for a few hours here with no vomiting or change in his mental status. Patient discharged in satisfactory condition back to his nursing facility.  Final Clinical Impressions(s) / ED Diagnoses   Final diagnoses:  Contusion of forehead, initial encounter    New Prescriptions New Prescriptions   No medications on file     Sopheap Boehle, Ambrose Finland, MD 07/13/16 [phone]

## 2016-07-13 NOTE — ED Triage Notes (Signed)
Per EMS, [facility], pt has dementia, staff reports pt was found on BR floor naked.  Only injury noted per staff was small hematoma to R forehead.  Pt is alert and oriented per norm.

## 2016-07-13 NOTE — ED Notes (Signed)
[facility].  Spke to Elease HashimotoPatricia, pt's nurse

## 2016-07-13 NOTE — ED Notes (Signed)
Pt is resting comfortably, eating lunch

## 2016-07-13 NOTE — Discharge Instructions (Signed)
THE CT IMAGES OF YOUR HEAD AND NECK SHOWED NO INJURIES. RETURN TO ER IF YOU HAVE ANY VOMITING, CONFUSION, SPEECH PROBLEMS, BALANCE PROBLEMS, OR VISION PROBLEMS.

## 2016-07-13 NOTE — ED Notes (Signed)
Patient transported to CT 

## 2016-07-13 NOTE — ED Notes (Signed)
[facility] contacted for transport back to [facility]. 

## 2016-07-16 ENCOUNTER — Emergency Department (HOSPITAL_COMMUNITY)
Admission: EM | Admit: 2016-07-16 | Discharge: 2016-07-16 | Disposition: A | Discharge status: Home / Self Care | Payer: Medicaid Other | Attending: Emergency Medicine | Admitting: Emergency Medicine

## 2016-07-16 ENCOUNTER — Encounter (HOSPITAL_COMMUNITY): Payer: Self-pay | Admitting: Obstetrics and Gynecology

## 2016-07-16 DIAGNOSIS — W19XXXA Unspecified fall, initial encounter: Secondary | ICD-10-CM | POA: Diagnosis not present

## 2016-07-16 DIAGNOSIS — Z87891 Personal history of nicotine dependence: Secondary | ICD-10-CM | POA: Insufficient documentation

## 2016-07-16 DIAGNOSIS — Z794 Long term (current) use of insulin: Secondary | ICD-10-CM | POA: Diagnosis not present

## 2016-07-16 DIAGNOSIS — Z7982 Long term (current) use of aspirin: Secondary | ICD-10-CM | POA: Diagnosis not present

## 2016-07-16 DIAGNOSIS — I10 Essential (primary) hypertension: Secondary | ICD-10-CM | POA: Diagnosis not present

## 2016-07-16 DIAGNOSIS — Z79899 Other long term (current) drug therapy: Secondary | ICD-10-CM | POA: Diagnosis not present

## 2016-07-16 DIAGNOSIS — E119 Type 2 diabetes mellitus without complications: Secondary | ICD-10-CM | POA: Insufficient documentation

## 2016-07-16 DIAGNOSIS — F0390 Unspecified dementia without behavioral disturbance: Secondary | ICD-10-CM | POA: Diagnosis present

## 2016-07-16 DIAGNOSIS — R3981 Functional urinary incontinence: Secondary | ICD-10-CM | POA: Insufficient documentation

## 2016-07-16 LAB — URINALYSIS, ROUTINE W REFLEX MICROSCOPIC
Bilirubin Urine: NEGATIVE
Glucose, UA: NEGATIVE mg/dL
Hgb urine dipstick: NEGATIVE
Ketones, ur: NEGATIVE mg/dL
Leukocytes, UA: NEGATIVE
Nitrite: NEGATIVE
Protein, ur: NEGATIVE mg/dL
Specific Gravity, Urine: 1.015 (ref 1.005–1.030)
pH: 6 (ref 5.0–8.0)

## 2016-07-16 NOTE — ED Notes (Signed)
[facility] Called 

## 2016-07-16 NOTE — ED Notes (Signed)
Report given to Carren RangJasmine, [employer] [facility]

## 2016-07-16 NOTE — Discharge Instructions (Signed)
Continue taking your medications as prescribed. Do not try to walk alone. Take ibuprofen or tylenol as needed for pain. Follow up with your PCP this week for re-evaluation. Return to the ED if any concerning signs or symptoms develop.

## 2016-07-16 NOTE — ED Notes (Signed)
Patient ambulated in hallway, patient very unsteady on feet and needed assistance from Clinical research associatewriter. Patient also incontinent and does not know when he needs to void for urine specimen.

## 2016-07-16 NOTE — ED Provider Notes (Signed)
[facility]-EMERGENCY DEPT Provider Note   CSN: [id number] Arrival date & time: 07/16/16  1636     History   Chief Complaint Chief Complaint  Patient presents with  . Fall   Level V caveat due to dementia HPI Manuel Higgins is a 66 y.o. male with history of dementia, DM, HLD, HTN, stroke, anemia who presents today from his nursing home with complaint of multiple falls. Per triage note, patient has fallen 4 or 5 times today landing on his buttocks every time. Patient is reporting no pain. Per nursing home policy he must be evaluated. He has been seen and evaluated multiple times this month for falls with negative workup. He denies fevers, chills, weakness, difficulty breathing.   The history is provided by the nursing home and the EMS personnel.    Past Medical History:  Diagnosis Date  . Anemia   . Dementia    unspecified w/o behavioral disturbance  . Diabetes mellitus without complication (HCC)   . Dysphagia   . Hyperlipidemia   . Hypertension   . Hypertensive crisis 02/15/2013  . Stroke [facility](HCC) 03/2013   Multiple infarcts  . Toe ulcer [facility](HCC)     Patient Active Problem List   Diagnosis Date Noted  . Hypoglycemia 07/09/2016  . Confusion 07/08/2016  . E. coli UTI 07/08/2016  . Dementia with behavioral disturbance 06/08/2016  . Diabetes mellitus type 2 in nonobese (HCC) 03/28/2016  . Fall 03/28/2016  . ARF (acute renal failure) (HCC) 03/27/2016  . 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 Hypokalemia 02/14/2016  . Leukocytosis 02/14/2016  . Hypoglycemia due to insulin 06/12/2015  . Altered mental status 05/07/2015  . History of embolic stroke 05/07/2015  . History of CVA with residual deficit 05/07/2015  . Acute encephalopathy 05/07/2015  . Essential hypertension 04/24/2014  . HLD (hyperlipidemia) 04/24/2014  . Dysphagia 04/04/2013  . Callus of foot 02/16/2013    Past Surgical History:  Procedure Laterality Date  . BACK SURGERY    .  LOOP RECORDER IMPLANT  04/08/2013   MDT LinQ implanted by Dr Ladona Ridgelaylor for cryptogenic stroke  . LOOP RECORDER IMPLANT N/A 04/08/2013   Procedure: LOOP RECORDER IMPLANT;  Surgeon: Marinus MawGregg W Taylor, MD;  [facility];  Service: Cardiovascular;  Laterality: N/A;  . TEE WITHOUT CARDIOVERSION N/A 04/07/2013   Procedure: TRANSESOPHAGEAL ECHOCARDIOGRAM (TEE);  Surgeon: Lars MassonKatarina H Nelson, MD;  [facility];  Service: Cardiovascular;  Laterality: N/A;  . TONSILLECTOMY         Home Medications    Prior to Admission medications   Medication Sig Start Date End Date Taking? Authorizing Provider  amLODipine (NORVASC) 10 MG tablet Take 1 tablet (10 mg total) by mouth daily. 03/31/16  Yes Sheikh, Omair Latif, DO  aspirin 81 MG chewable tablet Chew 1 tablet (81 mg total) by mouth daily. 05/10/15  Yes Selina CooleyFlores, Kyle, MD  atorvastatin (LIPITOR) 80 MG tablet Take 1 tablet (80 mg total) by mouth daily at 6 PM. 05/10/15  Yes Selina CooleyFlores, Kyle, MD  baclofen (LIORESAL) 10 MG tablet Take 1 tablet (10 mg total) by mouth at bedtime. 05/10/15  Yes Selina CooleyFlores, Kyle, MD  cephALEXin (KEFLEX) 500 MG capsule Take 500 mg by mouth 2 (two) times daily. 10 Days   Yes [provider]  Cholecalciferol (VITAMIN D) 2000 units CAPS Take 2,000 Units by mouth daily.    Yes [provider]  divalproex (DEPAKOTE SPRINKLE) 125 MG capsule Take 1 capsule (125 mg total) by mouth  2 (two) times daily. 07/09/16  Yes Maxie Barb, MD  donepezil (ARICEPT) 5 MG tablet Take 5 mg by mouth at bedtime.   Yes [provider]  FLUoxetine (PROZAC) 40 MG capsule Take 40 mg by mouth daily.    Yes [provider]  gabapentin (NEURONTIN) 100 MG capsule Take 200 mg by mouth 2 (two) times daily.   Yes [provider]  haloperidol (HALDOL) 0.5 MG tablet Take 0.5 mg by mouth 2 (two) times daily.   Yes [provider]  hydrALAZINE (APRESOLINE) 25 MG tablet Take 1 tablet (25 mg total) by mouth every 8  (eight) hours. 03/31/16  Yes Sheikh, Omair Latif, DO  insulin aspart (NOVOLOG FLEXPEN) 100 UNIT/ML FlexPen Inject 2-12 Units into the skin 3 (three) times daily with meals. Pt uses per sliding scale:  150-200:  2 units, 201-250:  4 units, 251-300:  6 units, 301-350:  8 units, 351-400:  10 units, > 401:  12 units and call MD.   Yes [provider]  LORazepam (ATIVAN) 1 MG tablet Take 1 mg by mouth 3 (three) times daily.   Yes [provider]  QUEtiapine (SEROQUEL) 100 MG tablet Take 100 mg by mouth 2 (two) times daily.   Yes [provider]  acetaminophen (TYLENOL) 500 MG tablet Take 500 mg by mouth every 4 (four) hours as needed for mild pain, moderate pain, fever or headache.    [provider]  alum & mag hydroxide-simeth (ALMACONE) 200-200-20 MG/5ML suspension Take 30 mLs by mouth every 6 (six) hours as needed for indigestion or heartburn.     [provider]  guaifenesin (ROBITUSSIN) 100 MG/5ML syrup Take 200 mg by mouth every 6 (six) hours as needed for cough.    [provider]  loperamide (IMODIUM) 2 MG capsule Take 2 mg by mouth every 3 (three) hours as needed for diarrhea or loose stools.    [provider]  LORazepam (ATIVAN) 0.5 MG tablet Take 0.25 mg by mouth every 8 (eight) hours as needed for anxiety.    [provider]  magnesium hydroxide (MILK OF MAGNESIA) 400 MG/5ML suspension Take 30 mLs by mouth at bedtime as needed for mild constipation.    [provider]  neomycin-bacitracin-polymyxin (NEOSPORIN) ointment Apply 1 application topically 4 (four) times daily as needed (minor skin tears/abrasions).     [provider]    Family History Family History  Problem Relation Age of Onset  . Family history unknown: Yes    Social History Social History  Substance Use Topics  . Smoking status: Former Smoker    Packs/day: 0.33    Years: 10.00    Types: Cigarettes    Quit date: 01/17/2004  .  Smokeless tobacco: Never Used  . Alcohol use No     Allergies   Risperdal [risperidone]   Review of Systems Review of Systems  Unable to perform ROS: Dementia     Physical Exam Updated Vital Signs BP (!) 151/62 (BP Location: Right Arm)   Pulse 78   Temp 97.7 F (36.5 C) (Oral)   Resp 14   Ht 5\' 9"  (1.753 m)   Wt 74.8 kg (165 lb)   SpO2 97%   BMI 24.37 kg/m   Physical Exam  Constitutional: He appears well-developed and well-nourished. No distress.  Resting comfortably in bed  HENT:  Head: Normocephalic.  Right Ear: External ear normal.  Left Ear: External ear normal.  Nose: Nose normal.  Mouth/Throat: Oropharynx is  clear and moist.  Raccoon's eyes (old), no tenderness to palpation of the face or skull, no deformity or crepitus noted. No battle signs, no rhinorrhea. No ecchymosis, swelling, or erythema otherwise.   Eyes: Conjunctivae and EOM are normal. Pupils are equal, round, and reactive to light. Right eye exhibits no discharge. Left eye exhibits no discharge.  Neck: Normal range of motion. Neck supple. No JVD present. No tracheal deviation present.  No midline spine TTP, no paraspinal muscle tenderness. No deformity or crepitus or step-off noted  Cardiovascular: Normal rate, regular rhythm, normal heart sounds and intact distal pulses.   2+ radial and DP/PT pulses bl   Pulmonary/Chest: Effort normal. He exhibits no tenderness.  Hypoactive breath sounds globally, equal rise and fall of chest, no increased work of breathing, no paradoxical wall motion  Abdominal: Soft. Bowel sounds are normal. He exhibits no distension. There is no tenderness.  Musculoskeletal: Normal range of motion. He exhibits no edema, tenderness or deformity.  No midline spine TTP, no paraspinal muscle tenderness. Normal range of motion of extremities. No tenderness, crepitus, or deformity noted on palpation of the extremities. 5/5 strength of BUE and BLE major muscle groups with good grip  strength. Moves extremities spontaneously  Neurological: He is alert.  Oriented to person and place but not time. Fluent speech, no facial droop, no pronator drift, sensation intact to soft touch of extremities. Cranial nerves III through XII tested and intact. Slow, shuffling gait, but able to ambulate with assistance.   Skin: Skin is warm and dry. Capillary refill takes less than 2 seconds. He is not diaphoretic.  Well-healing scabs to bilateral knees. Skin discoloration around knees consistent with vascular disease and does not trauma  Psychiatric: He has a normal mood and affect. His behavior is normal.     ED Treatments / Results  Labs (all labs ordered are listed, but only abnormal results are displayed) Labs Reviewed  URINALYSIS, ROUTINE W REFLEX MICROSCOPIC    EKG  EKG Interpretation None       Radiology No results found.  Procedures Procedures (including critical care time)  Medications Ordered in ED Medications - No data to display   Initial Impression / Assessment and Plan / ED Course  I have reviewed the triage vital signs and the nursing notes.  Pertinent labs & imaging results that were available during my care of the patient were reviewed by me and considered in my medical decision making (see chart for details).     Patient presenting for evaluation of multiple falls today from nursing home. He has been seen here many times recently for the same. Afebrile, vital signs are at patient's baseline, no focal neurological deficits. No evidence of acute trauma or wounds. He is resting comfortably in no apparent distress. UA not concerning for UTI. He appears to be at his mental status baseline, is following commands appropriately, and does not localize pain. No imaging indicated at this time. He is stable for discharge back to the nursing home with strict fall precautions and follow up with his primary care as needed. Patient seen and evaluated by Dr. Particia Nearing, who  agrees with plan and assessment.   Final Clinical Impressions(s) / ED Diagnoses   Final diagnoses:  Fall, initial encounter    New Prescriptions New Prescriptions   No medications on file     Bennye Alm 07/16/16 1924    Jacalyn Lefevre, MD 07/16/16 [phone]

## 2016-07-16 NOTE — ED Notes (Signed)
Bed: WA03 Expected date:  Expected time:  Means of arrival:  Comments: 66 yo fall

## 2016-07-16 NOTE — ED Triage Notes (Signed)
Per EMS:  Pt coming in for multiple falls. Pt has fallen 4 or 5 times today. Pt lands on buttocks every time. Pt reports no pain. EMS palpated spine with no tenderness. Per nursing home policy pt must be evaluated.   Vitals 148/70 HR 80 RR 16 99% on RA CBG 164

## 2016-07-20 ENCOUNTER — Emergency Department (HOSPITAL_COMMUNITY)
Admission: EM | Admit: 2016-07-20 | Discharge: 2016-07-21 | Disposition: A | Discharge status: Home / Self Care | Payer: Medicaid Other | Attending: Emergency Medicine | Admitting: Emergency Medicine

## 2016-07-20 ENCOUNTER — Encounter (HOSPITAL_COMMUNITY): Payer: Self-pay | Admitting: *Deleted

## 2016-07-20 ENCOUNTER — Emergency Department (HOSPITAL_COMMUNITY): Payer: Medicaid Other

## 2016-07-20 DIAGNOSIS — I10 Essential (primary) hypertension: Secondary | ICD-10-CM | POA: Diagnosis not present

## 2016-07-20 DIAGNOSIS — E11649 Type 2 diabetes mellitus with hypoglycemia without coma: Secondary | ICD-10-CM | POA: Diagnosis not present

## 2016-07-20 DIAGNOSIS — F0391 Unspecified dementia with behavioral disturbance: Secondary | ICD-10-CM | POA: Diagnosis not present

## 2016-07-20 DIAGNOSIS — Z87891 Personal history of nicotine dependence: Secondary | ICD-10-CM | POA: Diagnosis not present

## 2016-07-20 DIAGNOSIS — W19XXXA Unspecified fall, initial encounter: Secondary | ICD-10-CM

## 2016-07-20 DIAGNOSIS — F03918 Unspecified dementia, unspecified severity, with other behavioral disturbance: Secondary | ICD-10-CM | POA: Diagnosis present

## 2016-07-20 DIAGNOSIS — Z79899 Other long term (current) drug therapy: Secondary | ICD-10-CM | POA: Insufficient documentation

## 2016-07-20 DIAGNOSIS — R4689 Other symptoms and signs involving appearance and behavior: Secondary | ICD-10-CM

## 2016-07-20 DIAGNOSIS — R413 Other amnesia: Secondary | ICD-10-CM | POA: Diagnosis not present

## 2016-07-20 DIAGNOSIS — Z7982 Long term (current) use of aspirin: Secondary | ICD-10-CM | POA: Diagnosis not present

## 2016-07-20 DIAGNOSIS — Z794 Long term (current) use of insulin: Secondary | ICD-10-CM | POA: Diagnosis not present

## 2016-07-20 DIAGNOSIS — R4589 Other symptoms and signs involving emotional state: Secondary | ICD-10-CM | POA: Insufficient documentation

## 2016-07-20 MED ORDER — ACETAMINOPHEN 325 MG PO TABS: 650.0000 mg | ORAL_TABLET | ORAL | Status: DC | PRN

## 2016-07-20 MED ORDER — IBUPROFEN 200 MG PO TABS: 600.0000 mg | ORAL_TABLET | Freq: Three times a day (TID) | ORAL | Status: DC | PRN

## 2016-07-20 NOTE — ED Triage Notes (Signed)
Per EMS, staff state pt became aggressive after attempting to get pt to stay our of other residents room. Staff state pt is alert at baseline. Pt cooperative with EMS. Staff would like pt to have psych eval.   BP 145/76 HR 81 RR 18 O2 97% CBG 177

## 2016-07-20 NOTE — ED Notes (Signed)
[facility] Expected date:  Expected time:  Means of arrival:  Comments: EMS combative, dementia

## 2016-07-20 NOTE — ED Notes (Signed)
Pt continues to attempt to get OOB  Asking for snack  Pt given a ham sandwich and some water

## 2016-07-20 NOTE — ED Notes (Signed)
Pt got out of bed in hallway  Unsteady gait  Assisted back to bed  Pt asking for a snack

## 2016-07-20 NOTE — ED Notes (Signed)
Pt is very pleasant and cooperative with Clinical research associatewriter.  Pt is able to understand commands.

## 2016-07-20 NOTE — ED Provider Notes (Signed)
[facility]-EMERGENCY DEPT Provider Note   CSN: [id number] Arrival date & time: 07/20/16  1454     History   Chief Complaint Chief Complaint  Patient presents with  . Aggressive Behavior  . Psychiatric Evaluation    HPI Manuel Higgins is a 66 y.o. male.  HPI 66 year old African-American male past medical history significant for dementia, diabetes, hypertension, stroke, anemia that lives at a skilled nursing facility who comes by EMS today from nursing facility for mechanical fall that was witnessed along with aggressive behavior. Per the staff at the facility patient became aggressive after attempting to get patient to stay out of other residents room. [facility] who states that patient did have a mechanical fall after trying to go to other people's rooms. They state that he is at his baseline. Did not lose consciousness. Unsure if patient hit his head. Staff states the patient is at his baseline. Patient has been cooperative with EMS in the ED. The staff light patient have a psychiatric eval. The patient is able to ambulate at baseline without a walker or cane. History of several falls.  Level V caveat due to dementia.    Past Medical History:  Diagnosis Date  . Anemia   . Dementia    unspecified w/o behavioral disturbance  . Diabetes mellitus without complication (HCC)   . Dysphagia   . Hyperlipidemia   . Hypertension   . Hypertensive crisis 02/15/2013  . Stroke [facility](HCC) 03/2013   Multiple infarcts  . Toe ulcer [facility](HCC)     Patient Active Problem List   Diagnosis Date Noted  . Hypoglycemia 07/09/2016  . Confusion 07/08/2016  . E. coli UTI 07/08/2016  . Dementia with behavioral disturbance 06/08/2016  . Diabetes mellitus type 2 in nonobese (HCC) 03/28/2016  . Fall 03/28/2016  . ARF (acute renal failure) (HCC) 03/27/2016  . 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 Hypokalemia  02/14/2016  . Leukocytosis 02/14/2016  . Hypoglycemia due to insulin 06/12/2015  . Altered mental status 05/07/2015  . History of embolic stroke 05/07/2015  . History of CVA with residual deficit 05/07/2015  . Acute encephalopathy 05/07/2015  . Essential hypertension 04/24/2014  . HLD (hyperlipidemia) 04/24/2014  . Dysphagia 04/04/2013  . Callus of foot 02/16/2013    Past Surgical History:  Procedure Laterality Date  . BACK SURGERY    . LOOP RECORDER IMPLANT  04/08/2013   MDT LinQ implanted by Dr Ladona Ridgelaylor for cryptogenic stroke  . LOOP RECORDER IMPLANT N/A 04/08/2013   Procedure: LOOP RECORDER IMPLANT;  Surgeon: Marinus MawGregg W Taylor, MD;  [facility];  Service: Cardiovascular;  Laterality: N/A;  . TEE WITHOUT CARDIOVERSION N/A 04/07/2013   Procedure: TRANSESOPHAGEAL ECHOCARDIOGRAM (TEE);  Surgeon: Lars MassonKatarina H Nelson, MD;  [facility];  Service: Cardiovascular;  Laterality: N/A;  . TONSILLECTOMY         Home Medications    Prior to Admission medications   Medication Sig Start Date End Date Taking? Authorizing Provider  acetaminophen (TYLENOL) 500 MG tablet Take 500 mg by mouth every 4 (four) hours as needed for mild pain, moderate pain, fever or headache.   Yes [provider]  alum & mag hydroxide-simeth (ALMACONE) 200-200-20 MG/5ML suspension Take 30 mLs by mouth every 6 (six) hours as needed for indigestion or heartburn.    Yes [provider]  amLODipine (NORVASC) 10 MG tablet Take 1 tablet (10 mg total) by mouth  daily. 03/31/16  Yes Sheikh, Omair Latif, DO  aspirin 81 MG chewable tablet Chew 1 tablet (81 mg total) by mouth daily. 05/10/15  Yes Selina Cooley, MD  atorvastatin (LIPITOR) 80 MG tablet Take 1 tablet (80 mg total) by mouth daily at 6 PM. 05/10/15  Yes Selina Cooley, MD  baclofen (LIORESAL) 10 MG tablet Take 1 tablet (10 mg total) by mouth at bedtime. 05/10/15  Yes Selina Cooley, MD  cephALEXin (KEFLEX) 500 MG capsule Take 500 mg by mouth 2  (two) times daily. 10 Days   Yes [provider]  Cholecalciferol (VITAMIN D) 2000 units CAPS Take 2,000 Units by mouth daily.    Yes [provider]  donepezil (ARICEPT) 5 MG tablet Take 5 mg by mouth at bedtime.   Yes [provider]  FLUoxetine (PROZAC) 40 MG capsule Take 40 mg by mouth daily.    Yes [provider]  gabapentin (NEURONTIN) 100 MG capsule Take 100-200 mg by mouth 3 (three) times daily. Take 200 mg daily at 0800, 2000 and 100 mg at 1400   Yes [provider]  guaifenesin (ROBITUSSIN) 100 MG/5ML syrup Take 200 mg by mouth every 6 (six) hours as needed for cough.   Yes [provider]  haloperidol (HALDOL) 1 MG tablet Take 1 mg by mouth 2 (two) times daily.   Yes [provider]  hydrALAZINE (APRESOLINE) 25 MG tablet Take 1 tablet (25 mg total) by mouth every 8 (eight) hours. 03/31/16  Yes Sheikh, Omair Latif, DO  insulin aspart (NOVOLOG FLEXPEN) 100 UNIT/ML FlexPen Inject 2-12 Units into the skin 3 (three) times daily with meals. Pt uses per sliding scale:  150-200:  2 units, 201-250:  4 units, 251-300:  6 units, 301-350:  8 units, 351-400:  10 units, > 401:  12 units and call MD.   Yes [provider]  loperamide (IMODIUM) 2 MG capsule Take 2 mg by mouth every 3 (three) hours as needed for diarrhea or loose stools.   Yes [provider]  LORazepam (ATIVAN) 0.5 MG tablet Take 0.25 mg by mouth every 8 (eight) hours as needed for anxiety.   Yes [provider]  LORazepam (ATIVAN) 1 MG tablet Take 1 mg by mouth 3 (three) times daily.   Yes [provider]  magnesium hydroxide (MILK OF MAGNESIA) 400 MG/5ML suspension Take 30 mLs by mouth at bedtime as needed for mild constipation.   Yes [provider]  neomycin-bacitracin-polymyxin (NEOSPORIN) ointment Apply 1 application topically 4 (four) times daily as needed (minor skin tears/abrasions).    Yes [provider]    QUEtiapine (SEROQUEL) 100 MG tablet Take 100 mg by mouth 2 (two) times daily.   Yes [provider]  divalproex (DEPAKOTE SPRINKLE) 125 MG capsule Take 1 capsule (125 mg total) by mouth 2 (two) times daily. Patient not taking: Reported on 07/20/2016 07/09/16   Maxie Barb, MD    Family History Family History  Problem Relation Age of Onset  . Family history unknown: Yes    Social History Social History  Substance Use Topics  . Smoking status: Former Smoker    Packs/day: 0.33    Years: 10.00    Types: Cigarettes    Quit date: 01/17/2004  . Smokeless tobacco: Never Used  . Alcohol use No     Allergies   Risperdal [risperidone]   Review of Systems Review of Systems  Unable to perform ROS: Dementia     Physical Exam Updated Vital  Signs BP 140/77 (BP Location: Right Arm)   Pulse 73   Temp 98.3 F (36.8 C) (Oral)   Resp 18   SpO2 99%   Physical Exam  Constitutional: He is oriented to person, place, and time. He appears well-developed and well-nourished.  Non-toxic appearance. No distress.  HENT:  Head: Normocephalic and atraumatic.  Mouth/Throat: Oropharynx is clear and moist.  No bilateral hemotympanum. No septal hematoma.  Eyes: Conjunctivae are normal. Pupils are equal, round, and reactive to light. Right eye exhibits no discharge. Left eye exhibits no discharge.  Neck: Normal range of motion. Neck supple.  No c spine midline tenderness. No paraspinal tenderness. No deformities or step offs noted. Full ROM. Supple. No nuchal rigidity.    Cardiovascular: Normal rate, regular rhythm, normal heart sounds and intact distal pulses.  Exam reveals no gallop and no friction rub.   No murmur heard. Pulmonary/Chest: Effort normal and breath sounds normal. No respiratory distress. He has no wheezes. He has no rales. He exhibits no tenderness.  Abdominal: Soft. Bowel sounds are normal. He exhibits no distension. There is no tenderness. There is no rebound and  no guarding.  Musculoskeletal: Normal range of motion. He exhibits no tenderness.  No midline T spine or L spine tenderness. No deformities or step offs noted. Full ROM. Pelvis is stable.   Lymphadenopathy:    He has no cervical adenopathy.  Neurological: He is alert and oriented to person, place, and time.  Unable to get accurate neuro exam due to dementai. Appears at baseline. Moving all 4 extremities without any difficulties.  Skin: Skin is warm and dry. Capillary refill takes less than 2 seconds. No rash noted.  Psychiatric: His behavior is normal. Judgment and thought content normal.  Nursing note and vitals reviewed.    ED Treatments / Results  Labs (all labs ordered are listed, but only abnormal results are displayed) Labs Reviewed  COMPREHENSIVE METABOLIC PANEL - Abnormal; Notable for the following:       Result Value   Glucose, Bld 162 (*)    BUN 31 (*)    Creatinine, Ser 1.28 (*)    Calcium 8.8 (*)    Alkaline Phosphatase 154 (*)    GFR calc non Af Amer 58 (*)    All other components within normal limits  CBC WITH DIFFERENTIAL/PLATELET - Abnormal; Notable for the following:    RBC 2.64 (*)    Hemoglobin 8.5 (*)    HCT 25.1 (*)    All other components within normal limits  ETHANOL  RAPID URINE DRUG SCREEN, HOSP PERFORMED    EKG  EKG Interpretation None       Radiology Ct Head Wo Contrast  Result Date: 07/20/2016 CLINICAL DATA:  Fall.  Altered mental status, aggression. EXAM: CT HEAD WITHOUT CONTRAST CT CERVICAL SPINE WITHOUT CONTRAST TECHNIQUE: Multidetector CT imaging of the head and cervical spine was performed following the standard protocol without intravenous contrast. Multiplanar CT image reconstructions of the cervical spine were also generated. COMPARISON:  CT 1 week prior 07/13/2016, brain MRI 2 weeks prior 07/08/2016 FINDINGS: CT HEAD FINDINGS Brain: Stable degree of atrophy and chronic small vessel ischemia. Remote lacunar infarcts in the the right  basal ganglia. No intracranial hemorrhage, mass effect, or midline shift. No hydrocephalus. The basilar cisterns are patent. No evidence of territorial infarct. No intracranial fluid collection. Vascular: Atherosclerosis of skullbase vasculature without hyperdense vessel or abnormal calcification. Skull: No skull fracture.  No focal lesion. Sinuses/Orbits: Paranasal sinuses and mastoid air  cells are clear. The visualized orbits are unremarkable. Other: Stable small right frontal scalp thickening/edema. CT CERVICAL SPINE FINDINGS Alignment: No acute malalignment.  Stable straightening of lordosis. Skull base and vertebrae: No acute fracture. The dens and skull base are intact. Facet arthropathy with ankylosis C2 through C4 on the left and C2-C3 on the right, unchanged. Soft tissues and spinal canal: No prevertebral fluid or swelling. No visible canal hematoma. Disc levels: Disc space narrowing and endplate spurring from C3-C4 through C5-C6, stable. Upper chest: No acute abnormality. Other: Carotid vascular calcifications. IMPRESSION: 1. No acute intracranial abnormality. No skull fracture. Stable atrophy and chronic small vessel ischemia. 2. Stable degenerative change in the cervical spine without acute abnormality. Electronically Signed   By: Rubye Oaks M.D.   On: 07/20/2016 19:13   Ct Cervical Spine Wo Contrast  Result Date: 07/20/2016 CLINICAL DATA:  Fall.  Altered mental status, aggression. EXAM: CT HEAD WITHOUT CONTRAST CT CERVICAL SPINE WITHOUT CONTRAST TECHNIQUE: Multidetector CT imaging of the head and cervical spine was performed following the standard protocol without intravenous contrast. Multiplanar CT image reconstructions of the cervical spine were also generated. COMPARISON:  CT 1 week prior 07/13/2016, brain MRI 2 weeks prior 07/08/2016 FINDINGS: CT HEAD FINDINGS Brain: Stable degree of atrophy and chronic small vessel ischemia. Remote lacunar infarcts in the the right basal ganglia. No  intracranial hemorrhage, mass effect, or midline shift. No hydrocephalus. The basilar cisterns are patent. No evidence of territorial infarct. No intracranial fluid collection. Vascular: Atherosclerosis of skullbase vasculature without hyperdense vessel or abnormal calcification. Skull: No skull fracture.  No focal lesion. Sinuses/Orbits: Paranasal sinuses and mastoid air cells are clear. The visualized orbits are unremarkable. Other: Stable small right frontal scalp thickening/edema. CT CERVICAL SPINE FINDINGS Alignment: No acute malalignment.  Stable straightening of lordosis. Skull base and vertebrae: No acute fracture. The dens and skull base are intact. Facet arthropathy with ankylosis C2 through C4 on the left and C2-C3 on the right, unchanged. Soft tissues and spinal canal: No prevertebral fluid or swelling. No visible canal hematoma. Disc levels: Disc space narrowing and endplate spurring from C3-C4 through C5-C6, stable. Upper chest: No acute abnormality. Other: Carotid vascular calcifications. IMPRESSION: 1. No acute intracranial abnormality. No skull fracture. Stable atrophy and chronic small vessel ischemia. 2. Stable degenerative change in the cervical spine without acute abnormality. Electronically Signed   By: Rubye Oaks M.D.   On: 07/20/2016 19:13    Procedures Procedures (including critical care time)  Medications Ordered in ED Medications - No data to display   Initial Impression / Assessment and Plan / ED Course  I have reviewed the triage vital signs and the nursing notes.  Pertinent labs & imaging results that were available during my care of the patient were reviewed by me and considered in my medical decision making (see chart for details).     Patient presents to the emergency department today after being aggressive to staff at the facility after trying to go to other residents room and being told no. Staff does report the patient had a mechanical fall unsure if he hit  his head. Denies any LOC. This was witnessed. Patient has no complaints at this time however does have dementia at baseline. Staff states the patient is at his baseline. Patient is very cooperative in the ED. History of falls and has been seen several times in the ED for same. Imaging shows no abnormalities. Did place TTS console. They recommended overnight stay with a.m. psych eval  in the morning. Patient is currently hemodynamically stable. He has been eating and walking with no difficulties. Labs are patient's baseline. Alkaline phosphatase is mildly elevated with history of same. Will need follow-up with his PCP concerning this. Has no belly pain. Doubt any acute process at this time. The patient can be medically cleared at this time pending TTS recommendations.  Final Clinical Impressions(s) / ED Diagnoses   Final diagnoses:  Fall, initial encounter  Aggressive behavior    New Prescriptions New Prescriptions   No medications on file     Wallace Keller 07/20/16 2130    Rise Mu, PA-C 07/21/16 0134    Little, Ambrose Finland, MD 07/21/16 [phone]

## 2016-07-20 NOTE — BH Assessment (Addendum)
Tele Assessment Note  [facility]. He is cooperative with soft speech. Pt is oriented to self, date and place but not situation. He is a poor historian as he is unable to answer some questions. Pt reports he is able to do his ADLs. Per pt's RN Matt, pt has been drooling and was covered in urine upon arrival. Pt says he doesn't remember when asked if he became aggressive when prevented from going into other resident's rooms. He denies HI and SI. Writer unable to determine whether pt having hallucinations or whether delusions are present. Per chart review, [facility] for frequent falls onto his bottom. Pt tells Clinical research associatewriter he can walk with aid of a walker.  Manuel Higgins is an 66 y.o. male.   Diagnosis: Mild Neurocognitive Disorder  Past Medical History:  Past Medical History:  Diagnosis Date  . Anemia   . Dementia    unspecified w/o behavioral disturbance  . Diabetes mellitus without complication (HCC)   . Dysphagia   . Hyperlipidemia   . Hypertension   . Hypertensive crisis 02/15/2013  . Stroke [facility](HCC) 03/2013   Multiple infarcts  . Toe ulcer (HCC)     Past Surgical History:  Procedure Laterality Date  . BACK SURGERY    . LOOP RECORDER IMPLANT  04/08/2013   MDT LinQ implanted by Dr Ladona Ridgelaylor for cryptogenic stroke  . LOOP RECORDER IMPLANT N/A 04/08/2013   Procedure: LOOP RECORDER IMPLANT;  Surgeon: Marinus MawGregg W Taylor, MD;  [facility];  Service: Cardiovascular;  Laterality: N/A;  . TEE WITHOUT CARDIOVERSION N/A 04/07/2013   Procedure: TRANSESOPHAGEAL ECHOCARDIOGRAM (TEE);  Surgeon: Lars MassonKatarina H Nelson, MD;  [facility];  Service: Cardiovascular;  Laterality: N/A;  . TONSILLECTOMY      Family History:  Family History  Problem Relation Age of Onset  . Family history unknown: Yes    Social History:  reports that he quit smoking about 12 years ago. His smoking use included Cigarettes. He has a 3.30 pack-year  smoking history. He has never used smokeless tobacco. He reports that he does not drink alcohol or use drugs.  Additional Social History:  Alcohol / Drug Use Pain Medications: pt denies abuse - see pta meds list Prescriptions: pt denies abuse - see pta meds list Over the Counter: pt denies abuse - see pta meds list History of alcohol / drug use?: No history of alcohol / drug abuse  CIWA: CIWA-Ar BP: 140/77 Pulse Rate: 73 COWS:    PATIENT STRENGTHS: (choose at least two) Capable to communication Pt lives in ALF  Allergies:  Allergies  Allergen Reactions  . Risperdal [Risperidone] Other (See Comments)    Reaction:  Unknown     Home Medications:  (Not in a hospital admission)  OB/GYN Status:  No LMP for male patient.  General Assessment Data Location of Assessment: [facility] ED TTS Assessment: In system Is this a Tele or Face-to-Face Assessment?: Face-to-Face Is this an Initial Assessment or a Re-assessment for this encounter?: Initial Assessment Marital status:  (unable to assess) Juanell FairlyMaiden name: n/a Is patient pregnant?: No Pregnancy Status: No Living Arrangements: Other (Comment) [facility]([place] ) Can pt return to current living arrangement?: Yes Admission Status: Voluntary Is patient capable of signing voluntary admission?: No Referral Source:  ([place]) Insurance type: medicaid     Crisis Care Plan Living Arrangements: Other (Comment) [facility]([place] ) Name of Psychiatrist: unable to assess Name of Therapist: unable  to assess  Education Status Is patient currently in school?: No  Risk to self with the past 6 months Suicidal Ideation: No Has patient been a risk to self within the past 6 months prior to admission? : No Is patient at risk for suicide?: No Access to Means: No What has been your use of drugs/alcohol within the last 12 months?: n/a Previous Attempts/Gestures:  (unable to assess) Other Self Harm Risks: n/a Intentional Self Injurious Behavior:  None Family Suicide History: Unable to assess Recent stressful life event(s):  (unable to assess) Depression Symptoms: Feeling angry/irritable Substance abuse history and/or treatment for substance abuse?: No Suicide prevention information given to non-admitted patients: Not applicable  Risk to Others within the past 6 months Homicidal Ideation: No Does patient have any lifetime risk of violence toward others beyond the six months prior to admission? : No Thoughts of Harm to Others: No Current Homicidal Intent: No Current Homicidal Plan: No Access to Homicidal Means: No History of harm to others?:  (unable to assess) Assessment of Violence: None Noted Violent Behavior Description: unable to assess - per chart, pt aggressive today at ALF Does patient have access to weapons?: No Criminal Charges Pending?: No Does patient have a court date: No Is patient on probation?: No  Psychosis Hallucinations:  (unable to assess) Delusions:  (uanble to assess)  Mental Status Report Appearance/Hygiene: Other (Comment) (pt wearing paper bib) Eye Contact: Good Motor Activity: Freedom of movement Speech: Soft Level of Consciousness: Quiet/awake Mood:  (unable to assess) Affect: Other (Comment) (euthymic) Anxiety Level:  (unable to assess) Thought Processes: Coherent Judgement: Impaired Orientation: Person, Place, Time Obsessive Compulsive Thoughts/Behaviors: Unable to Assess  Cognitive Functioning Concentration: Decreased Memory: Recent Impaired, Remote Impaired IQ: Average Insight: Poor Impulse Control: Poor Sleep: Unable to Assess Vegetative Symptoms: Unable to Assess  ADLScreening [facility] Assessment Services) Patient's cognitive ability adequate to safely complete daily activities?: No Patient able to express need for assistance with ADLs?: No Independently performs ADLs?: No  Prior Inpatient Therapy Prior Inpatient Therapy:  (unable to assess)  Prior Outpatient Therapy Prior  Outpatient Therapy:  (unable to assess) Does patient have an ACCT team?: No Does patient have Intensive In-House Services?  : No Does patient have [facility] services? : No Does patient have P4CC services?: Unknown  ADL Screening (condition at time of admission) Patient's cognitive ability adequate to safely complete daily activities?: No Is the patient deaf or have difficulty hearing?: No Does the patient have difficulty seeing, even when wearing glasses/contacts?: No Does the patient have difficulty concentrating, remembering, or making decisions?: Yes Patient able to express need for assistance with ADLs?: No Does the patient have difficulty dressing or bathing?: Yes Independently performs ADLs?: No Communication: Appropriate for developmental age Dressing (OT): Needs assistance Is this a change from baseline?: Pre-admission baseline Grooming: Needs assistance Is this a change from baseline?: Pre-admission baseline Feeding: Independent Is this a change from baseline?: Pre-admission baseline Bathing: Needs assistance Is this a change from baseline?: Pre-admission baseline Toileting: Needs assistance Is this a change from baseline?: Pre-admission baseline In/Out Bed: Needs assistance Is this a change from baseline?: Pre-admission baseline Walks in Home: Independent with device (comment) (pt sts walks with walker) Is this a change from baseline?: Pre-admission baseline Does the patient have difficulty walking or climbing stairs?: Yes Weakness of Legs: None Weakness of Arms/Hands: None  Home Assistive Devices/Equipment Home Assistive Devices/Equipment: Walker (specify type)    Abuse/Neglect Assessment (Assessment to be complete while patient is alone) Physical Abuse: Denies  Verbal Abuse: Denies Sexual Abuse: Denies Exploitation of patient/patient's resources: Denies Self-Neglect: Denies     Merchant navy officer (For Healthcare) Does Patient Have a Medical Advance Directive?:  No Type of Advance Directive: Healthcare Power of Attorney Copy of Healthcare Power of Attorney in Chart?: No - copy requested Would patient like information on creating a medical advance directive?: No - Patient declined    Additional Information 1:1 In Past 12 Months?: No CIRT Risk: Yes Elopement Risk: No Does patient have medical clearance?: Yes     Disposition:  Disposition Initial Assessment Completed for this Encounter: Yes Disposition of Patient: Other dispositions Other disposition(s):  (jamison lord DNP rec observe overnight w/ am psych eval)  Berkley Cronkright P 07/20/2016 6:05 PM

## 2016-07-21 DIAGNOSIS — F0391 Unspecified dementia with behavioral disturbance: Secondary | ICD-10-CM

## 2016-07-21 DIAGNOSIS — Z87891 Personal history of nicotine dependence: Secondary | ICD-10-CM | POA: Diagnosis not present

## 2016-07-21 DIAGNOSIS — R413 Other amnesia: Secondary | ICD-10-CM

## 2016-07-21 LAB — CBC WITH DIFFERENTIAL/PLATELET
BASOS ABS: 0 10*3/uL (ref 0.0–0.1)
Basophils Relative: 0 %
Eosinophils Absolute: 0.2 10*3/uL (ref 0.0–0.7)
Eosinophils Relative: 2 %
HEMATOCRIT: 25.1 % — AB (ref 39.0–52.0)
Hemoglobin: 8.5 g/dL — ABNORMAL LOW (ref 13.0–17.0)
LYMPHS ABS: 1.6 10*3/uL (ref 0.7–4.0)
LYMPHS PCT: 18 %
MCH: 32.2 pg (ref 26.0–34.0)
MCHC: 33.9 g/dL (ref 30.0–36.0)
MCV: 95.1 fL (ref 78.0–100.0)
MONOS PCT: 9 %
Monocytes Absolute: 0.8 10*3/uL (ref 0.1–1.0)
NEUTROS ABS: 6.4 10*3/uL (ref 1.7–7.7)
Neutrophils Relative %: 71 %
Platelets: 285 10*3/uL (ref 150–400)
RBC: 2.64 MIL/uL — ABNORMAL LOW (ref 4.22–5.81)
RDW: 13.8 % (ref 11.5–15.5)
WBC: 9.1 10*3/uL (ref 4.0–10.5)

## 2016-07-21 LAB — CBG MONITORING, ED
Glucose-Capillary: 153 mg/dL — ABNORMAL HIGH (ref 65–99)
Glucose-Capillary: 196 mg/dL — ABNORMAL HIGH (ref 65–99)

## 2016-07-21 LAB — RAPID URINE DRUG SCREEN, HOSP PERFORMED
Amphetamines: NOT DETECTED
BARBITURATES: NOT DETECTED
Benzodiazepines: POSITIVE — AB
COCAINE: NOT DETECTED
OPIATES: NOT DETECTED
Tetrahydrocannabinol: NOT DETECTED

## 2016-07-21 LAB — COMPREHENSIVE METABOLIC PANEL
ALK PHOS: 154 U/L — AB (ref 38–126)
ALT: 27 U/L (ref 17–63)
ANION GAP: 7 (ref 5–15)
AST: 27 U/L (ref 15–41)
Albumin: 3.5 g/dL (ref 3.5–5.0)
BILIRUBIN TOTAL: 0.4 mg/dL (ref 0.3–1.2)
BUN: 31 mg/dL — ABNORMAL HIGH (ref 6–20)
CALCIUM: 8.8 mg/dL — AB (ref 8.9–10.3)
CO2: 27 mmol/L (ref 22–32)
Chloride: 105 mmol/L (ref 101–111)
Creatinine, Ser: 1.28 mg/dL — ABNORMAL HIGH (ref 0.61–1.24)
GFR calc non Af Amer: 58 mL/min — ABNORMAL LOW (ref >60)
Glucose, Bld: 162 mg/dL — ABNORMAL HIGH (ref 65–99)
Potassium: 4.7 mmol/L (ref 3.5–5.1)
Sodium: 139 mmol/L (ref 135–145)
TOTAL PROTEIN: 7.1 g/dL (ref 6.5–8.1)

## 2016-07-21 LAB — ETHANOL

## 2016-07-21 MED ORDER — GABAPENTIN 100 MG PO CAPS
100.0000 mg | ORAL_CAPSULE | Freq: Three times a day (TID) | ORAL | Status: DC
  Administered 2016-07-21: 200 mg via ORAL
  Filled 2016-07-21: qty 1

## 2016-07-21 MED ORDER — DONEPEZIL HCL 5 MG PO TABS
5.0000 mg | ORAL_TABLET | Freq: Every day | ORAL | Status: DC
  Administered 2016-07-21: 5 mg via ORAL
  Filled 2016-07-21: qty 1

## 2016-07-21 MED ORDER — AMLODIPINE BESYLATE 5 MG PO TABS
10.0000 mg | ORAL_TABLET | Freq: Every day | ORAL | Status: DC
  Administered 2016-07-21: 10 mg via ORAL
  Filled 2016-07-21: qty 2

## 2016-07-21 MED ORDER — HALOPERIDOL 1 MG PO TABS
1.0000 mg | ORAL_TABLET | Freq: Two times a day (BID) | ORAL | Status: DC
  Administered 2016-07-21 (×2): 1 mg via ORAL
  Filled 2016-07-21 (×2): qty 1

## 2016-07-21 MED ORDER — HYDRALAZINE HCL 25 MG PO TABS
25.0000 mg | ORAL_TABLET | Freq: Three times a day (TID) | ORAL | Status: DC
  Administered 2016-07-21: 25 mg via ORAL
  Filled 2016-07-21 (×2): qty 1

## 2016-07-21 MED ORDER — ATORVASTATIN CALCIUM 80 MG PO TABS: 80.0000 mg | ORAL_TABLET | Freq: Every day | ORAL | Status: DC

## 2016-07-21 MED ORDER — LORAZEPAM 1 MG PO TABS: 0.5000 mg | ORAL_TABLET | Freq: Three times a day (TID) | ORAL | 0 refills | Status: AC

## 2016-07-21 MED ORDER — IBUPROFEN 200 MG PO TABS: 600.0000 mg | ORAL_TABLET | Freq: Three times a day (TID) | ORAL | Status: DC | PRN

## 2016-07-21 MED ORDER — ALUM & MAG HYDROXIDE-SIMETH 200-200-20 MG/5ML PO SUSP: 30.0000 mL | Freq: Four times a day (QID) | ORAL | Status: DC | PRN

## 2016-07-21 MED ORDER — QUETIAPINE FUMARATE 100 MG PO TABS
100.0000 mg | ORAL_TABLET | Freq: Two times a day (BID) | ORAL | Status: DC
  Administered 2016-07-21 (×2): 100 mg via ORAL
  Filled 2016-07-21 (×2): qty 1

## 2016-07-21 MED ORDER — BACLOFEN 10 MG PO TABS
10.0000 mg | ORAL_TABLET | Freq: Every day | ORAL | Status: DC
  Administered 2016-07-21: 10 mg via ORAL
  Filled 2016-07-21: qty 1

## 2016-07-21 MED ORDER — INSULIN ASPART 100 UNIT/ML FLEXPEN: 2.0000 [IU] | PEN_INJECTOR | Freq: Three times a day (TID) | SUBCUTANEOUS | Status: DC

## 2016-07-21 MED ORDER — INSULIN ASPART 100 UNIT/ML ~~LOC~~ SOLN: 0.0000 [IU] | Freq: Three times a day (TID) | SUBCUTANEOUS | Status: DC

## 2016-07-21 MED ORDER — FLUOXETINE HCL 20 MG PO CAPS
40.0000 mg | ORAL_CAPSULE | Freq: Every day | ORAL | Status: DC
  Administered 2016-07-21: 40 mg via ORAL
  Filled 2016-07-21: qty 2

## 2016-07-21 MED ORDER — VITAMIN D 1000 UNITS PO TABS
2000.0000 [IU] | ORAL_TABLET | Freq: Every day | ORAL | Status: DC
  Administered 2016-07-21: 2000 [IU] via ORAL
  Filled 2016-07-21: qty 2

## 2016-07-21 NOTE — Consult Note (Signed)
Manuel Higgins   Reason for Higgins:  Aggression  Referring Physician:  EDP Patient Identification: Manuel Higgins MRN:  [id number] Principal Diagnosis: Dementia with behavioral disturbance Diagnosis:   Patient Active Problem List   Diagnosis Date Noted  . Dementia with behavioral disturbance [F03.91] 06/08/2016    Priority: High  . Hypoglycemia [E16.2] 07/09/2016  . Confusion [R41.0] 07/08/2016  . E. coli UTI [N39.0, B96.20] 07/08/2016  . Diabetes mellitus type 2 in nonobese ([place]) [E11.9] 03/28/2016  . Fall [W19.XXXA] 03/28/2016  . ARF (acute renal failure) ([place]) [N17.9] 03/27/2016  . Type 2 diabetes mellitus with hypoglycemic insulin reaction ([place]) [E11.649] 02/15/2016  . Hypokalemia [E87.6] 02/14/2016  . Leukocytosis [D72.829] 02/14/2016  . Hypoglycemia due to insulin [E16.0, T38.3X5A] 06/12/2015  . Altered mental status [R41.82] 05/07/2015  . History of embolic stroke [B51.02] 58/52/7782  . History of CVA with residual deficit [I69.30] 05/07/2015  . Acute encephalopathy [G93.40] 05/07/2015  . Essential hypertension [I10] 04/24/2014  . HLD (hyperlipidemia) [E78.5] 04/24/2014  . Dysphagia [R13.10] 04/04/2013  . Callus of foot [L84] 02/16/2013    Total Time spent with patient: 45 minutes  Subjective:   Manuel Higgins is a 66 y.o. male patient does not warrant admission.  HPI:  66 yo male who presented to the ED with aggression, dementia diagnosis.  He has been calm and cooperative since arrival. No suicidal/homicidal ideations, hallucinations, or alcohol/drug abuse.  Stable to return to his facility.  Past Psychiatric History: dementia  Risk to Self: Suicidal Ideation: No Is patient at risk for suicide?: No Access to Means: No What has been your use of drugs/alcohol within the last 12 months?: n/a Other Self Harm Risks: n/a Intentional Self Injurious Behavior: None Risk to Others: Homicidal Ideation: No Thoughts of Harm to Others: No Current  Homicidal Intent: No Current Homicidal Plan: No Access to Homicidal Means: No History of harm to others?:  (unable to assess) Assessment of Violence: None Noted Violent Behavior Description: unable to assess - per chart, pt aggressive today at ALF Does patient have access to weapons?: No Criminal Charges Pending?: No Does patient have a court date: No Prior Inpatient Therapy: Prior Inpatient Therapy:  (unable to assess) Prior Outpatient Therapy: Prior Outpatient Therapy:  (unable to assess) Does patient have an ACCT team?: No Does patient have Intensive In-House Services?  : No Does patient have [facility] services? : No Does patient have P4CC services?: Unknown  Past Medical History:  Past Medical History:  Diagnosis Date  . Anemia   . Dementia    unspecified w/o behavioral disturbance  . Diabetes mellitus without complication ([place])   . Dysphagia   . Hyperlipidemia   . Hypertension   . Hypertensive crisis 02/15/2013  . Stroke [facility]) 03/2013   Multiple infarcts  . Toe ulcer ([place])     Past Surgical History:  Procedure Laterality Date  . BACK SURGERY    . LOOP RECORDER IMPLANT  04/08/2013   MDT LinQ implanted by Dr Lovena Le for cryptogenic stroke  . LOOP RECORDER IMPLANT N/A 04/08/2013   Procedure: LOOP RECORDER IMPLANT;  Surgeon: Evans Lance, MD;  Location: [facility] CATH LAB;  Service: Cardiovascular;  Laterality: N/A;  . TEE WITHOUT CARDIOVERSION N/A 04/07/2013   Procedure: TRANSESOPHAGEAL ECHOCARDIOGRAM (TEE);  Surgeon: Dorothy Spark, MD;  Location: [facility] ENDOSCOPY;  Service: Cardiovascular;  Laterality: N/A;  . TONSILLECTOMY     Family History:  Family History  Problem Relation Age of Onset  . Family history unknown: Yes   Family  Psychiatric  History: unknown Social History:  History  Alcohol Use No     History  Drug Use No    Comment: denies hx of IVDrug use    Social History   Social History  . Marital status: Single    Spouse name: N/A  . Number of children: 2  .  Years of education: BS   Occupational History  .      disabled   Social History Main Topics  . Smoking status: Former Smoker    Packs/day: 0.33    Years: 10.00    Types: Cigarettes    Quit date: 01/17/2004  . Smokeless tobacco: Never Used  . Alcohol use No  . Drug use: No     Comment: denies hx of IVDrug use  . Sexual activity: Not Asked   Other Topics Concern  . None   Social History Narrative   Lives in [place] in [place] for past 6 weeks.  Previously homeless.   Unemployed.  Caffeine 1 cup coffee, 235m sweet tea daily.   Additional Social History:    Allergies:   Allergies  Allergen Reactions  . Risperdal [Risperidone] Other (See Comments)    Reaction:  Unknown     Labs:  Results for orders placed or performed during the hospital encounter of 07/20/16 (from the past 48 hour(s))  Comprehensive metabolic panel     Status: Abnormal   Collection Time: 07/21/16 12:36 AM  Result Value Ref Range   Sodium 139 135 - 145 mmol/L   Potassium 4.7 3.5 - 5.1 mmol/L   Chloride 105 101 - 111 mmol/L   CO2 27 22 - 32 mmol/L   Glucose, Bld 162 (H) 65 - 99 mg/dL   BUN 31 (H) 6 - 20 mg/dL   Creatinine, Ser 1.28 (H) 0.61 - 1.24 mg/dL   Calcium 8.8 (L) 8.9 - 10.3 mg/dL   Total Protein 7.1 6.5 - 8.1 g/dL   Albumin 3.5 3.5 - 5.0 g/dL   AST 27 15 - 41 U/L   ALT 27 17 - 63 U/L   Alkaline Phosphatase 154 (H) 38 - 126 U/L   Total Bilirubin 0.4 0.3 - 1.2 mg/dL   GFR calc non Af Amer 58 (L) >60 mL/min   GFR calc Af Amer >60 >60 mL/min    Comment: (NOTE) The eGFR has been calculated using the CKD EPI equation. This calculation has not been validated in all clinical situations. eGFR's persistently <60 mL/min signify possible Chronic Kidney Disease.    Anion gap 7 5 - 15  Ethanol     Status: None   Collection Time: 07/21/16 12:36 AM  Result Value Ref Range   Alcohol, Ethyl (B) <5 <5 mg/dL    Comment:        LOWEST DETECTABLE LIMIT FOR SERUM ALCOHOL IS 5 mg/dL FOR  MEDICAL PURPOSES ONLY   Urine rapid drug screen (hosp performed)     Status: Abnormal   Collection Time: 07/21/16 12:36 AM  Result Value Ref Range   Opiates NONE DETECTED NONE DETECTED   Cocaine NONE DETECTED NONE DETECTED   Benzodiazepines POSITIVE (A) NONE DETECTED   Amphetamines NONE DETECTED NONE DETECTED   Tetrahydrocannabinol NONE DETECTED NONE DETECTED   Barbiturates NONE DETECTED NONE DETECTED    Comment:        DRUG SCREEN FOR MEDICAL PURPOSES ONLY.  IF CONFIRMATION IS NEEDED FOR ANY PURPOSE, NOTIFY LAB WITHIN 5 DAYS.        LOWEST DETECTABLE LIMITS  FOR URINE DRUG SCREEN Drug Class       Cutoff (ng/mL) Amphetamine      1000 Barbiturate      200 Benzodiazepine   160 Tricyclics       109 Opiates          300 Cocaine          300 THC              50   CBC with Diff     Status: Abnormal   Collection Time: 07/21/16 12:36 AM  Result Value Ref Range   WBC 9.1 4.0 - 10.5 K/uL   RBC 2.64 (L) 4.22 - 5.81 MIL/uL   Hemoglobin 8.5 (L) 13.0 - 17.0 g/dL   HCT 25.1 (L) 39.0 - 52.0 %   MCV 95.1 78.0 - 100.0 fL   MCH 32.2 26.0 - 34.0 pg   MCHC 33.9 30.0 - 36.0 g/dL   RDW 13.8 11.5 - 15.5 %   Platelets 285 150 - 400 K/uL   Neutrophils Relative % 71 %   Neutro Abs 6.4 1.7 - 7.7 K/uL   Lymphocytes Relative 18 %   Lymphs Abs 1.6 0.7 - 4.0 K/uL   Monocytes Relative 9 %   Monocytes Absolute 0.8 0.1 - 1.0 K/uL   Eosinophils Relative 2 %   Eosinophils Absolute 0.2 0.0 - 0.7 K/uL   Basophils Relative 0 %   Basophils Absolute 0.0 0.0 - 0.1 K/uL  POC CBG, ED     Status: Abnormal   Collection Time: 07/21/16  7:44 AM  Result Value Ref Range   Glucose-Capillary 153 (H) 65 - 99 mg/dL    Current Facility-Administered Medications  Medication Dose Route Frequency Provider Last Rate Last Dose  . acetaminophen (TYLENOL) tablet 650 mg  650 mg Oral Q4H PRN Doristine Devoid, PA-C      . alum & mag hydroxide-simeth (MAALOX/MYLANTA) 200-200-20 MG/5ML suspension 30 mL  30 mL Oral Q6H PRN  Ocie Cornfield T, PA-C      . amLODipine (NORVASC) tablet 10 mg  10 mg Oral Daily Ocie Cornfield T, PA-C   10 mg at 07/21/16 0947  . atorvastatin (LIPITOR) tablet 80 mg  80 mg Oral q1800 Ocie Cornfield T, PA-C      . baclofen (LIORESAL) tablet 10 mg  10 mg Oral QHS Ocie Cornfield T, PA-C   10 mg at 07/21/16 0158  . cholecalciferol (VITAMIN D) tablet 2,000 Units  2,000 Units Oral Daily Doristine Devoid, PA-C   2,000 Units at 07/21/16 3235  . donepezil (ARICEPT) tablet 5 mg  5 mg Oral QHS Ocie Cornfield T, PA-C   5 mg at 07/21/16 0158  . FLUoxetine (PROZAC) capsule 40 mg  40 mg Oral Daily Ocie Cornfield T, PA-C   40 mg at 07/21/16 0948  . gabapentin (NEURONTIN) capsule 100-200 mg  100-200 mg Oral TID Ocie Cornfield T, PA-C   200 mg at 07/21/16 0948  . haloperidol (HALDOL) tablet 1 mg  1 mg Oral BID Ocie Cornfield T, PA-C   1 mg at 07/21/16 0948  . hydrALAZINE (APRESOLINE) tablet 25 mg  25 mg Oral Q8H Doristine Devoid, PA-C   25 mg at 07/21/16 5732  . ibuprofen (ADVIL,MOTRIN) tablet 600 mg  600 mg Oral Q8H PRN Ocie Cornfield T, PA-C      . insulin aspart (novoLOG) injection 0-9 Units  0-9 Units Subcutaneous TID WC Davonna Belling, MD      . QUEtiapine (SEROQUEL) tablet 100  mg  100 mg Oral BID Doristine Devoid, PA-C   100 mg at 07/21/16 7824   Current Outpatient Prescriptions  Medication Sig Dispense Refill  . acetaminophen (TYLENOL) 500 MG tablet Take 500 mg by mouth every 4 (four) hours as needed for mild pain, moderate pain, fever or headache.    Marland Kitchen alum & mag hydroxide-simeth (ALMACONE) 235-361-44 MG/5ML suspension Take 30 mLs by mouth every 6 (six) hours as needed for indigestion or heartburn.     Marland Kitchen amLODipine (NORVASC) 10 MG tablet Take 1 tablet (10 mg total) by mouth daily. 30 tablet 0  . aspirin 81 MG chewable tablet Chew 1 tablet (81 mg total) by mouth daily. 90 tablet 3  . atorvastatin (LIPITOR) 80 MG tablet Take 1 tablet (80 mg total) by mouth daily at  6 PM. 90 tablet 3  . baclofen (LIORESAL) 10 MG tablet Take 1 tablet (10 mg total) by mouth at bedtime. 30 each 0  . cephALEXin (KEFLEX) 500 MG capsule Take 500 mg by mouth 2 (two) times daily. 10 Days    . Cholecalciferol (VITAMIN D) 2000 units CAPS Take 2,000 Units by mouth daily.     Marland Kitchen donepezil (ARICEPT) 5 MG tablet Take 5 mg by mouth at bedtime.    Marland Kitchen FLUoxetine (PROZAC) 40 MG capsule Take 40 mg by mouth daily.     Marland Kitchen gabapentin (NEURONTIN) 100 MG capsule Take 100-200 mg by mouth 3 (three) times daily. Take 200 mg daily at 0800, 2000 and 100 mg at 1400    . guaifenesin (ROBITUSSIN) 100 MG/5ML syrup Take 200 mg by mouth every 6 (six) hours as needed for cough.    . haloperidol (HALDOL) 1 MG tablet Take 1 mg by mouth 2 (two) times daily.    . hydrALAZINE (APRESOLINE) 25 MG tablet Take 1 tablet (25 mg total) by mouth every 8 (eight) hours. 90 tablet 0  . insulin aspart (NOVOLOG FLEXPEN) 100 UNIT/ML FlexPen Inject 2-12 Units into the skin 3 (three) times daily with meals. Pt uses per sliding scale:  150-200:  2 units, 201-250:  4 units, 251-300:  6 units, 301-350:  8 units, 351-400:  10 units, > 401:  12 units and call MD.    . loperamide (IMODIUM) 2 MG capsule Take 2 mg by mouth every 3 (three) hours as needed for diarrhea or loose stools.    Marland Kitchen LORazepam (ATIVAN) 0.5 MG tablet Take 0.25 mg by mouth every 8 (eight) hours as needed for anxiety.    Marland Kitchen LORazepam (ATIVAN) 1 MG tablet Take 1 mg by mouth 3 (three) times daily.    . magnesium hydroxide (MILK OF MAGNESIA) 400 MG/5ML suspension Take 30 mLs by mouth at bedtime as needed for mild constipation.    Marland Kitchen neomycin-bacitracin-polymyxin (NEOSPORIN) ointment Apply 1 application topically 4 (four) times daily as needed (minor skin tears/abrasions).     . QUEtiapine (SEROQUEL) 100 MG tablet Take 100 mg by mouth 2 (two) times daily.    . divalproex (DEPAKOTE SPRINKLE) 125 MG capsule Take 1 capsule (125 mg total) by mouth 2 (two) times daily. (Patient not  taking: Reported on 07/20/2016)      Musculoskeletal: Strength & Muscle Tone: within normal limits Gait & Station: unsteady Patient leans: N/A  Psychiatric Specialty Exam: Physical Exam  Constitutional: He is oriented to person, place, and time. He appears well-developed and well-nourished.  HENT:  Head: Normocephalic.  Neck: Normal range of motion.  Respiratory: Effort normal.  Musculoskeletal: Normal range of motion.  Neurological: He is alert and oriented to person, place, and time.  Psychiatric: He has a normal mood and affect. His speech is normal and behavior is normal. Judgment and thought content normal. Cognition and memory are impaired.    Review of Systems  Psychiatric/Behavioral: Positive for memory loss.  All other systems reviewed and are negative.   Blood pressure (!) 142/83, pulse 75, temperature 98.2 F (36.8 C), temperature source Oral, resp. rate 16, SpO2 97 %.There is no height or weight on file to calculate BMI.  General Appearance: Casual  Eye Contact:  Good  Speech:  Normal Rate  Volume:  Normal  Mood:  Euthymic  Affect:  Congruent  Thought Process:  Memory issues  Orientation:  Other:  person  Thought Content:  memory issues  Suicidal Thoughts:  No  Homicidal Thoughts:  No  Memory:  Immediate;   Poor Recent;   Poor Remote;   Poor  Judgement:  Fair  Insight:  Lacking  Psychomotor Activity:  Normal  Concentration:  Concentration: Fair and Attention Span: Fair  Recall:  Poor  Fund of Knowledge:  Fair  Language:  Good  Akathisia:  No  Handed:  Right  AIMS (if indicated):     Assets:  Housing Leisure Time Resilience  ADL's:  Impaired  Cognition:  Impaired,  Moderate  Sleep:        Treatment Plan Summary: Daily contact with patient to assess and evaluate symptoms and progress in treatment, Medication management and Plan dementia with behavioral disturbances:  -Crisis stabilization -Medication management:  Continue medical medications along  with Haldol 1 mg BID for psychosis, Aricept 5 mg daily for dementia, Prozac 40 mg daily for depression, and Seroquel 100 mg at bedtime for sleep and mood -Individual counseling  Disposition: No evidence of imminent risk to self or others at present.    Waylan Boga, NP 07/21/2016 9:49 AM  Patient seen face-to-face for psychiatric evaluation, chart reviewed and case discussed with the physician extender and developed treatment plan. Reviewed the information documented and agree with the treatment plan. Corena Pilgrim, MD

## 2016-07-21 NOTE — ED Notes (Signed)
Pt has attempted to get OOB multiple times.  Pt is alert but disoriented to date/time.

## 2016-07-21 NOTE — ED Notes (Signed)
Patient alert and oriented to baseline.  Patient DC back to SNF.  Patient DC via [facility].  V/S stable.  Patient was not showing any distress or aggression on departure.

## 2016-07-21 NOTE — BH Assessment (Signed)
[facility] Assessment Progress Note  Per Thedore MinsMojeed Akintayo, MD, this pt does not require psychiatric hospitalization at this time.  [facility]  Stacy GardnerErin Davenport, LCSWA agrees to facilitate this.  No behavioral health referrals are necessary.  Pt's nurse, [place], has been notified.  Doylene Canninghomas Shandy Vi, MA Triage Specialist [phone]

## 2016-07-21 NOTE — Progress Notes (Signed)
CSW [facility] wanting update on patients discharge. CSW informed representative patient was medically/ psychically cleared for discharge. [facility] informed CSW that they do not provide transportation. Patient can be transported by [facility] at this time. CSW will update NP and RN.   Stacy GardnerErin Keyara Ent, LCSWA Clinical Social Worker [phone]

## 2016-07-21 NOTE — ED Notes (Signed)
Pt. Had Incontinent episode. Pt was cleaned up and put on a new brief by this Clinical research associatewriter. Pt. changed in to a gown. Pt. is a 1 person assist.

## 2016-07-21 NOTE — [facility] Suicide Risk Assessment (Signed)
Suicide Risk Assessment  Discharge Assessment   [facility]   Principal Problem: Dementia with behavioral disturbance Discharge Diagnoses:  Patient Active Problem List   Diagnosis Date Noted  . Dementia with behavioral disturbance [F03.91] 06/08/2016    Priority: High  . Hypoglycemia [E16.2] 07/09/2016  . Confusion [R41.0] 07/08/2016  . E. coli UTI [N39.0, B96.20] 07/08/2016  . Diabetes mellitus type 2 in nonobese ([facility]) [E11.9] 03/28/2016  . Fall [W19.XXXA] 03/28/2016  . ARF (acute renal failure) ([facility]) [N17.9] 03/27/2016  . Type 2 diabetes mellitus with hypoglycemic insulin reaction ([facility]) [E11.649] 02/15/2016  . Hypokalemia [E87.6] 02/14/2016  . Leukocytosis [D72.829] 02/14/2016  . Hypoglycemia due to insulin [E16.0, T38.3X5A] 06/12/2015  . Altered mental status [R41.82] 05/07/2015  . History of embolic stroke [Z86.73] 05/07/2015  . History of CVA with residual deficit [I69.30] 05/07/2015  . Acute encephalopathy [G93.40] 05/07/2015  . Essential hypertension [I10] 04/24/2014  . HLD (hyperlipidemia) [E78.5] 04/24/2014  . Dysphagia [R13.10] 04/04/2013  . Callus of foot [L84] 02/16/2013    Total Time spent with patient: 45 minutes  Musculoskeletal: Strength & Muscle Tone: within normal limits Gait & Station: unsteady Patient leans: N/A  Psychiatric Specialty Exam: Physical Exam  Constitutional: He is oriented to person, place, and time. He appears well-developed and well-nourished.  HENT:  Head: Normocephalic.  Neck: Normal range of motion.  Respiratory: Effort normal.  Musculoskeletal: Normal range of motion.  Neurological: He is alert and oriented to person, place, and time.  Psychiatric: He has a normal mood and affect. His speech is normal and behavior is normal. Judgment and thought content normal. Cognition and memory are impaired.    Review of Systems  Psychiatric/Behavioral: Positive for memory loss.  All other systems reviewed and are  negative.   Blood pressure (!) 142/83, pulse 75, temperature 98.2 F (36.8 C), temperature source Oral, resp. rate 16, SpO2 97 %.There is no height or weight on file to calculate BMI.  General Appearance: Casual  Eye Contact:  Good  Speech:  Normal Rate  Volume:  Normal  Mood:  Euthymic  Affect:  Congruent  Thought Process:  Memory issues  Orientation:  Other:  person  Thought Content:  memory issues  Suicidal Thoughts:  No  Homicidal Thoughts:  No  Memory:  Immediate;   Poor Recent;   Poor Remote;   Poor  Judgement:  Fair  Insight:  Lacking  Psychomotor Activity:  Normal  Concentration:  Concentration: Fair and Attention Span: Fair  Recall:  Poor  Fund of Knowledge:  Fair  Language:  Good  Akathisia:  No  Handed:  Right  AIMS (if indicated):     Assets:  Housing Leisure Time Resilience  ADL's:  Impaired  Cognition:  Impaired,  Moderate  Sleep:       Mental Status Per Nursing Assessment::   On Admission:   aggression  Demographic Factors:  Male  Loss Factors: NA  Historical Factors: Impulsivity  Risk Reduction Factors:   Sense of responsibility to family, Living with another person, especially a relative, Positive social support and Positive therapeutic relationship  Continued Clinical Symptoms:  None  Cognitive Features That Contribute To Risk:  None    Suicide Risk:  Minimal: No identifiable suicidal ideation.  Patients presenting with no risk factors but with morbid ruminations; may be classified as minimal risk based on the severity of the depressive symptoms    Plan Of Care/Follow-up recommendations:  Activity:  as toleratedd Diet:  heart healthy diet  Nanine Means, NP 07/21/2016, 9:59 AM

## 2016-07-21 NOTE — Progress Notes (Signed)
CSW notified RN that [facility]. CSW contacted [facility] at 11:05am for transport.   Claude MangesKierra S. Dema Timmons, MSW, LCSW-A Emergency Department Clinical Social Worker [phone]

## 2016-07-21 NOTE — ED Notes (Signed)
Pt cleaned of urinary incontinence.

## 2016-07-21 NOTE — ED Notes (Signed)
Pt is a Clinical research associatelawyer who [facility]  Pt stated "my ex-wife lives in my house.  I have a child who lives in [place] and 1 [place]."  Pt denies aggressive behavior to staff @ [facility].

## 2016-07-21 NOTE — ED Notes (Signed)
[address] [facility], [phone], spoke with Michell HeinrichVernice, Med Tech.  Requested list of meds given PTA.  Fax # provided.  Vernice agreed to forward.

## 2017-04-16 DEATH — deceased

## 2017-12-14 IMAGING — CT CT HEAD W/O CM
4 series · 16 of 47 positions shown, 18 images · non-contrast
Comparison: Head CT 2 days prior 07/05/2016, additional prior exams
reviewed

CLINICAL DATA: Fall.  Found on floor.

EXAM:
CT HEAD WITHOUT CONTRAST
TECHNIQUE: Contiguous axial images were obtained from the base of the skull
through the vertex without intravenous contrast.

[Series 3: head without · axial · non-contrast · 0.49mm/px · z∈[-32,+88]mm · 7 of 34 slices shown, 9 images]
[im 5/34  brain]
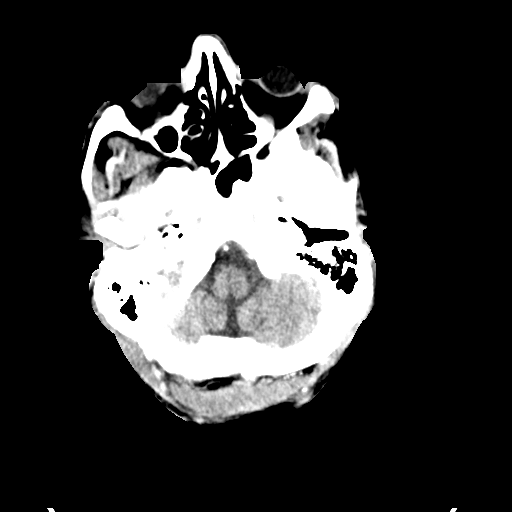
[im 5/34  bone]
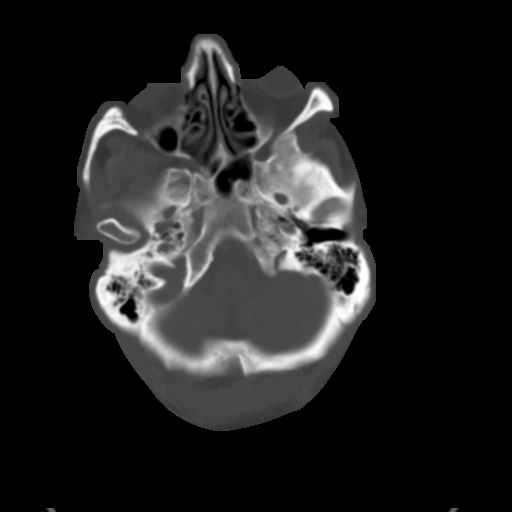
[im 9/34  brain]
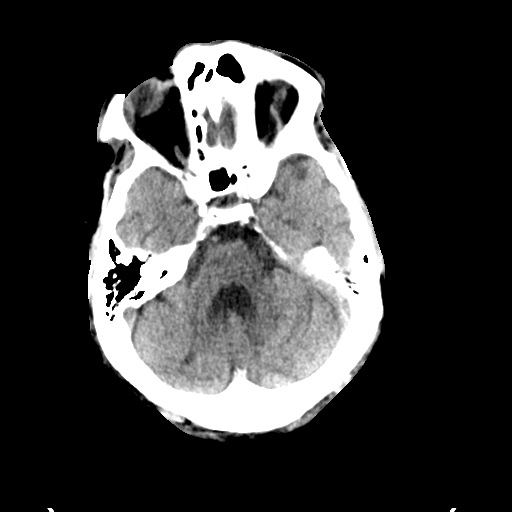
[im 13/34  brain]
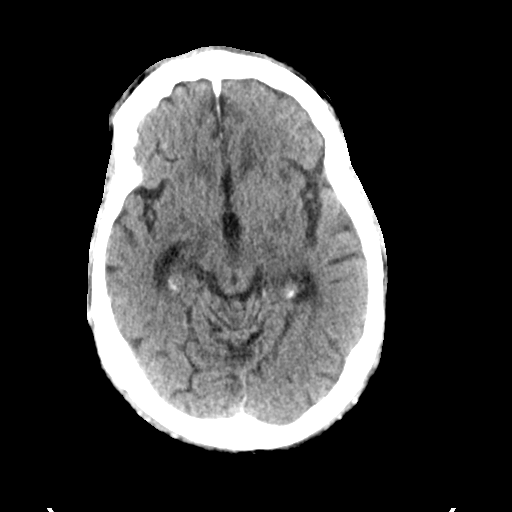
[im 17/34  brain]
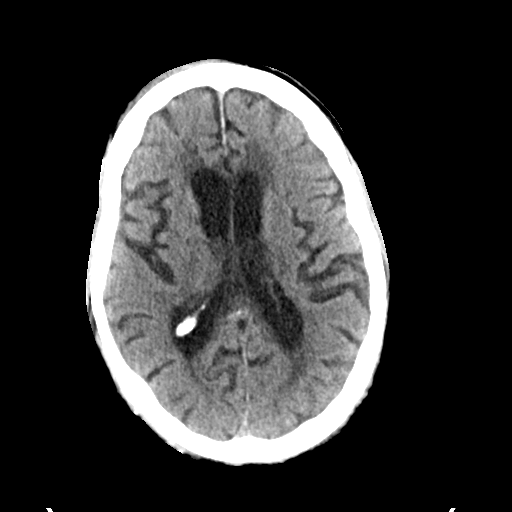
[im 21/34  brain]
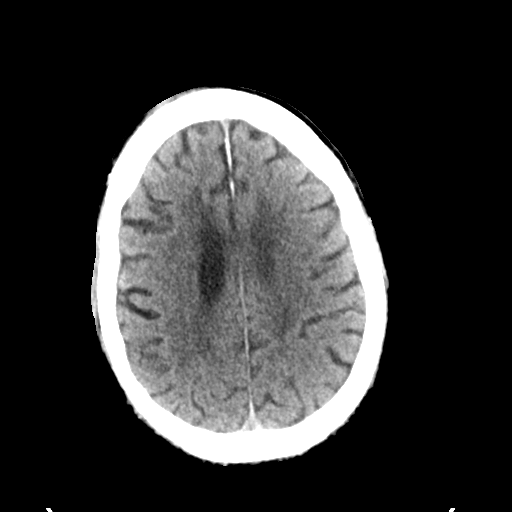
[im 21/34  bone]
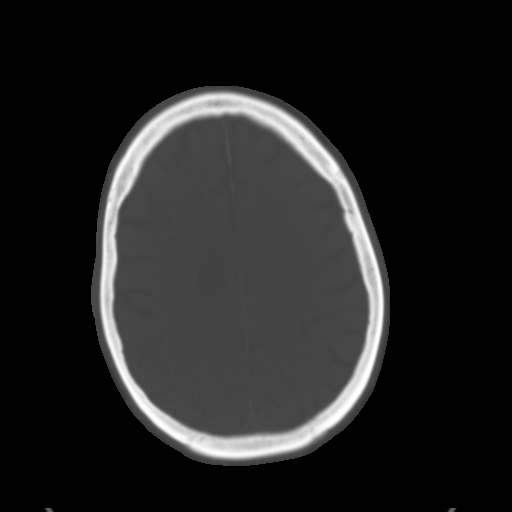
[im 25/34  brain]
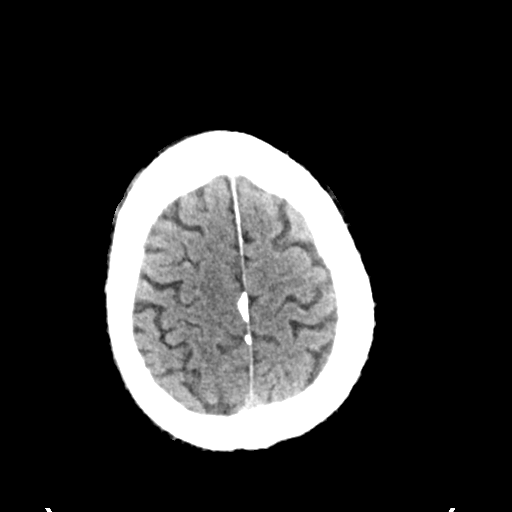
[im 29/34  brain]
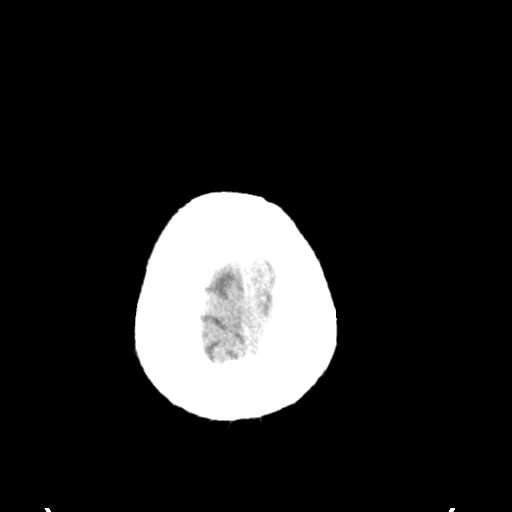

[Series 4: head bone · axial · 0.49mm/px · z∈[-36,-2]mm · 3 of 85 slices shown]
[im 9/85  bone]
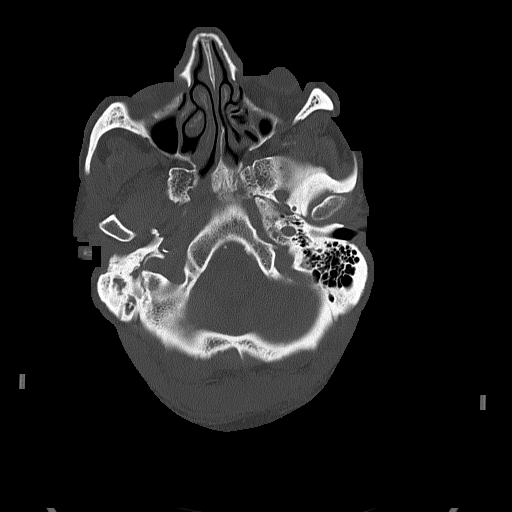
[im 17/85  bone]
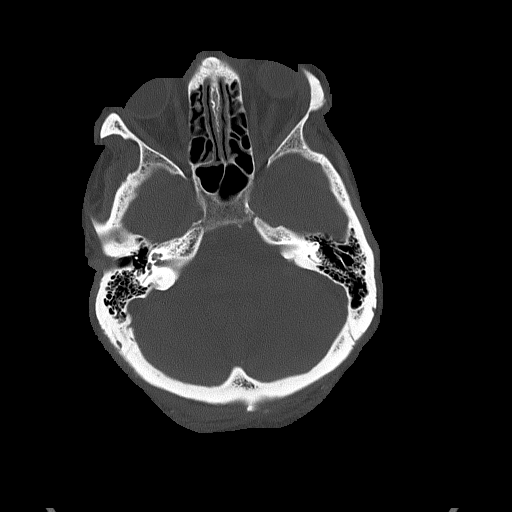
[im 26/85  bone]
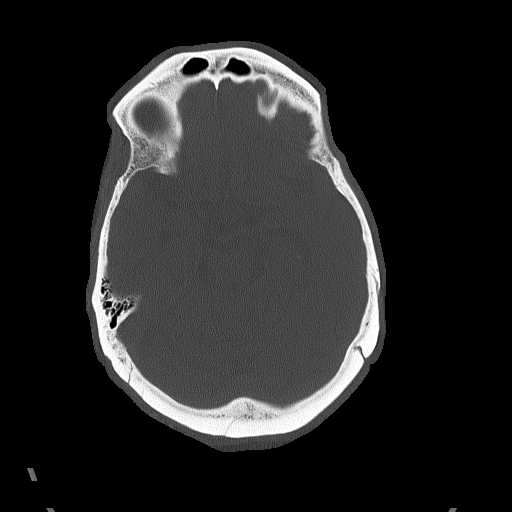

[Series 5: head without cor · coronal · non-contrast · 0.32mm/px · 3 of 77 slices shown]
[im 26/77  brain]
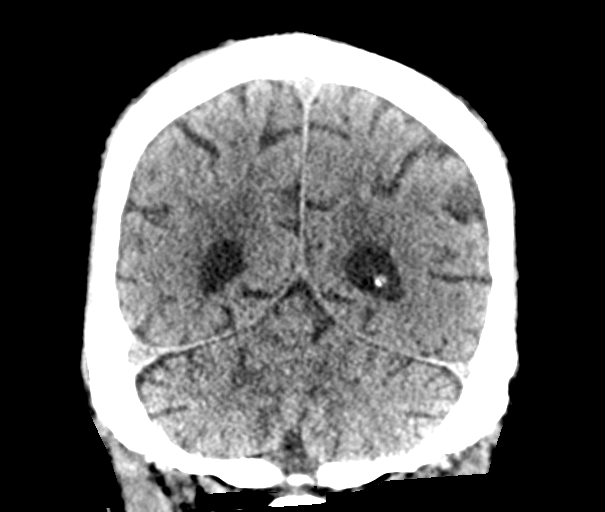
[im 34/77  brain]
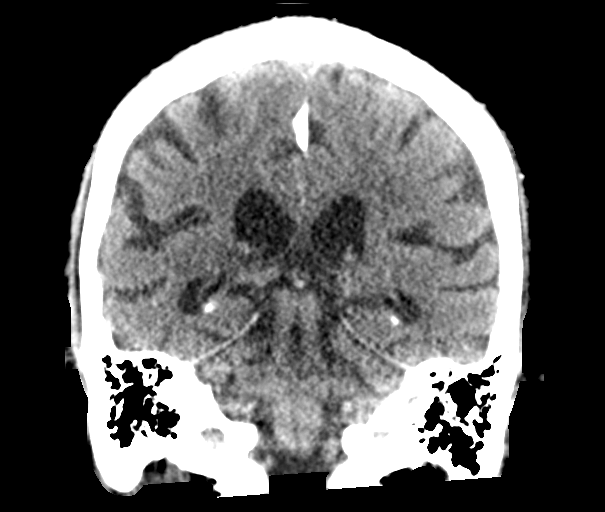
[im 43/77  brain]
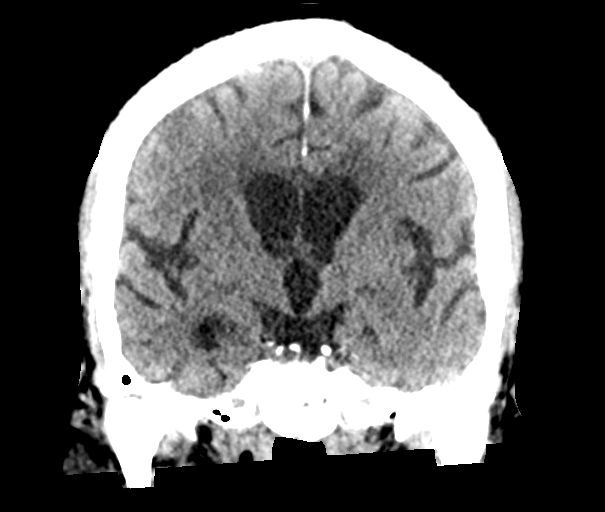

[Series 6: head without sag · sagittal · non-contrast · 0.33mm/px · 3 of 67 slices shown]
[im 12/67  brain]
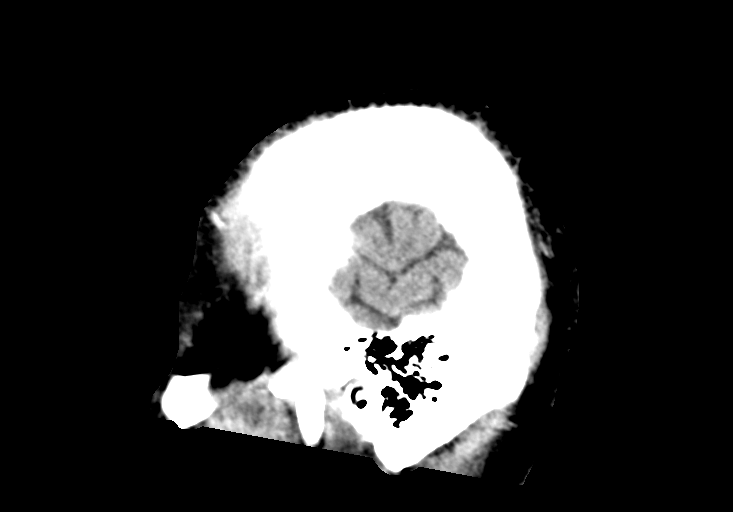
[im 23/67  brain]
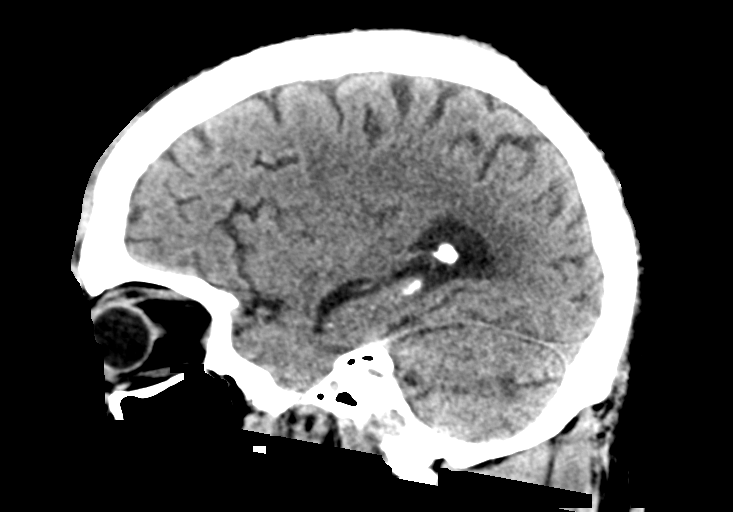
[im 34/67  brain]
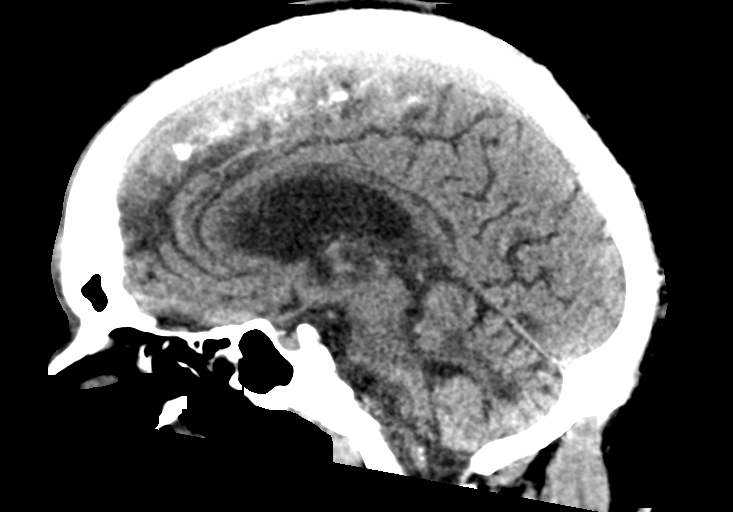

[16 of 47 positions shown; findings below may reference images not displayed]

FINDINGS: Brain: No intracranial hemorrhage. Stable atrophy and chronic small
vessel ischemia. Unchanged multifocal small lacunar infarcts in
bilateral cerebellum and basal ganglia. No evidence of acute
ischemia. No subdural or extra-axial fluid collection. No mass
effect or midline shift.

Vascular: Atherosclerosis of skullbase vasculature without
hyperdense vessel or abnormal calcification.

Skull: No skull fracture.

Sinuses/Orbits: Paranasal sinuses and mastoid air cells are clear.
The visualized orbits are unremarkable.

Other: Right frontal scalp hematoma has slightly decreased in size
from prior exam.
IMPRESSION: 1.  No acute intracranial abnormality.  No skull fracture.
2. Stable atrophy, chronic and remote ischemia.
